# Patient Record
Sex: Female | Born: 1965 | Race: White | Hispanic: No | Marital: Married | Smoking: Current every day smoker
Health system: Southern US, Community
[De-identification: ages and names within clinical notes are randomized; demographics above are authoritative.]

## PROBLEM LIST (undated history)

## (undated) DIAGNOSIS — J45909 Unspecified asthma, uncomplicated: Secondary | ICD-10-CM

## (undated) DIAGNOSIS — F419 Anxiety disorder, unspecified: Secondary | ICD-10-CM

## (undated) DIAGNOSIS — G629 Polyneuropathy, unspecified: Secondary | ICD-10-CM

## (undated) DIAGNOSIS — J449 Chronic obstructive pulmonary disease, unspecified: Secondary | ICD-10-CM

## (undated) DIAGNOSIS — E11621 Type 2 diabetes mellitus with foot ulcer: Secondary | ICD-10-CM

## (undated) DIAGNOSIS — G43909 Migraine, unspecified, not intractable, without status migrainosus: Secondary | ICD-10-CM

## (undated) DIAGNOSIS — K759 Inflammatory liver disease, unspecified: Secondary | ICD-10-CM

## (undated) DIAGNOSIS — F32A Depression, unspecified: Secondary | ICD-10-CM

## (undated) DIAGNOSIS — M797 Fibromyalgia: Secondary | ICD-10-CM

## (undated) DIAGNOSIS — G6181 Chronic inflammatory demyelinating polyneuritis: Secondary | ICD-10-CM

## (undated) DIAGNOSIS — E785 Hyperlipidemia, unspecified: Secondary | ICD-10-CM

## (undated) DIAGNOSIS — I10 Essential (primary) hypertension: Secondary | ICD-10-CM

## (undated) DIAGNOSIS — D649 Anemia, unspecified: Secondary | ICD-10-CM

## (undated) DIAGNOSIS — K219 Gastro-esophageal reflux disease without esophagitis: Secondary | ICD-10-CM

## (undated) DIAGNOSIS — Z87442 Personal history of urinary calculi: Secondary | ICD-10-CM

## (undated) DIAGNOSIS — I509 Heart failure, unspecified: Secondary | ICD-10-CM

## (undated) DIAGNOSIS — N189 Chronic kidney disease, unspecified: Secondary | ICD-10-CM

## (undated) DIAGNOSIS — E119 Type 2 diabetes mellitus without complications: Secondary | ICD-10-CM

## (undated) DIAGNOSIS — I219 Acute myocardial infarction, unspecified: Secondary | ICD-10-CM

## (undated) HISTORY — PX: FOOT SURGERY: SHX648

## (undated) HISTORY — PX: CORONARY ANGIOPLASTY WITH STENT PLACEMENT: SHX49

## (undated) HISTORY — PX: ABDOMINAL HYSTERECTOMY: SHX81

## (undated) HISTORY — PX: COLONOSCOPY: SHX174

## (undated) HISTORY — PX: UPPER GI ENDOSCOPY: SHX6162

## (undated) HISTORY — PX: APPENDECTOMY: SHX54

## (undated) HISTORY — PX: SPINAL FUSION: SHX223

## (undated) HISTORY — PX: SHOULDER SURGERY: SHX246

## (undated) HISTORY — PX: CHOLECYSTECTOMY: SHX55

---

## 1993-04-15 HISTORY — PX: TOTAL ABDOMINAL HYSTERECTOMY: SHX209

## 2016-04-15 DIAGNOSIS — F319 Bipolar disorder, unspecified: Secondary | ICD-10-CM

## 2016-04-15 DIAGNOSIS — Z9889 Other specified postprocedural states: Secondary | ICD-10-CM

## 2016-04-15 HISTORY — DX: Other specified postprocedural states: Z98.890

## 2016-04-15 HISTORY — DX: Bipolar disorder, unspecified: F31.9

## 2020-01-04 DIAGNOSIS — E118 Type 2 diabetes mellitus with unspecified complications: Secondary | ICD-10-CM | POA: Insufficient documentation

## 2020-10-13 HISTORY — PX: OTHER SURGICAL HISTORY: SHX169

## 2020-10-25 LAB — HM COLONOSCOPY

## 2021-01-15 HISTORY — PX: CORONARY ANGIOPLASTY WITH STENT PLACEMENT: SHX49

## 2021-04-30 DIAGNOSIS — D649 Anemia, unspecified: Secondary | ICD-10-CM | POA: Diagnosis not present

## 2021-04-30 DIAGNOSIS — Z20822 Contact with and (suspected) exposure to covid-19: Secondary | ICD-10-CM | POA: Diagnosis not present

## 2021-04-30 DIAGNOSIS — I5023 Acute on chronic systolic (congestive) heart failure: Secondary | ICD-10-CM | POA: Diagnosis not present

## 2021-04-30 DIAGNOSIS — R9431 Abnormal electrocardiogram [ECG] [EKG]: Secondary | ICD-10-CM | POA: Diagnosis not present

## 2021-04-30 DIAGNOSIS — J441 Chronic obstructive pulmonary disease with (acute) exacerbation: Secondary | ICD-10-CM | POA: Diagnosis not present

## 2021-04-30 DIAGNOSIS — E1165 Type 2 diabetes mellitus with hyperglycemia: Secondary | ICD-10-CM | POA: Diagnosis not present

## 2021-04-30 DIAGNOSIS — Z79899 Other long term (current) drug therapy: Secondary | ICD-10-CM | POA: Diagnosis not present

## 2021-04-30 DIAGNOSIS — J90 Pleural effusion, not elsewhere classified: Secondary | ICD-10-CM | POA: Diagnosis not present

## 2021-04-30 DIAGNOSIS — Z7951 Long term (current) use of inhaled steroids: Secondary | ICD-10-CM | POA: Diagnosis not present

## 2021-05-12 LAB — HM DIABETES EYE EXAM

## 2021-06-22 ENCOUNTER — Other Ambulatory Visit (HOSPITAL_COMMUNITY): Payer: Self-pay | Admitting: Orthopedic Surgery

## 2021-06-22 ENCOUNTER — Other Ambulatory Visit (HOSPITAL_COMMUNITY): Payer: Self-pay | Admitting: Internal Medicine

## 2021-06-22 DIAGNOSIS — I5032 Chronic diastolic (congestive) heart failure: Secondary | ICD-10-CM

## 2021-07-03 ENCOUNTER — Ambulatory Visit (HOSPITAL_COMMUNITY): Payer: Self-pay

## 2021-07-13 ENCOUNTER — Ambulatory Visit (HOSPITAL_COMMUNITY)
Admission: RE | Admit: 2021-07-13 | Discharge: 2021-07-13 | Disposition: A | Discharge status: Home / Self Care | Payer: 59 | Source: Ambulatory Visit | Attending: Internal Medicine | Admitting: Internal Medicine

## 2021-07-13 DIAGNOSIS — I340 Nonrheumatic mitral (valve) insufficiency: Secondary | ICD-10-CM | POA: Diagnosis not present

## 2021-07-13 DIAGNOSIS — I2510 Atherosclerotic heart disease of native coronary artery without angina pectoris: Secondary | ICD-10-CM | POA: Diagnosis not present

## 2021-07-13 DIAGNOSIS — I5032 Chronic diastolic (congestive) heart failure: Secondary | ICD-10-CM

## 2021-07-13 LAB — ECHOCARDIOGRAM COMPLETE
Area-P 1/2: 3.85 cm2
MV M vel: 4.93 m/s
MV Peak grad: 97.2 mmHg
Radius: 0.5 cm
S' Lateral: 4.2 cm
Single Plane A4C EF: 38.7 %

## 2021-07-13 NOTE — Progress Notes (Signed)
?  Echocardiogram ?2D Echocardiogram has been performed. ? ?Tracy Walsh ?07/13/2021, 2:06 PM ?

## 2021-07-24 ENCOUNTER — Other Ambulatory Visit (HOSPITAL_COMMUNITY): Payer: Self-pay | Admitting: Internal Medicine

## 2021-08-13 DIAGNOSIS — Z9289 Personal history of other medical treatment: Secondary | ICD-10-CM

## 2021-08-13 HISTORY — DX: Personal history of other medical treatment: Z92.89

## 2021-08-27 ENCOUNTER — Other Ambulatory Visit (HOSPITAL_COMMUNITY): Payer: Self-pay | Admitting: Nephrology

## 2021-08-27 ENCOUNTER — Ambulatory Visit: Payer: 59 | Admitting: Orthopedic Surgery

## 2021-08-27 DIAGNOSIS — E1122 Type 2 diabetes mellitus with diabetic chronic kidney disease: Secondary | ICD-10-CM

## 2021-08-29 ENCOUNTER — Observation Stay (HOSPITAL_COMMUNITY): Payer: 59

## 2021-08-29 ENCOUNTER — Emergency Department (HOSPITAL_COMMUNITY): Payer: 59

## 2021-08-29 ENCOUNTER — Inpatient Hospital Stay (HOSPITAL_COMMUNITY)
Admission: EM | Admit: 2021-08-29 | Discharge: 2021-09-01 | DRG: 683 | Disposition: A | Discharge status: Home / Self Care | Payer: 59 | Attending: Internal Medicine | Admitting: Internal Medicine

## 2021-08-29 ENCOUNTER — Encounter (HOSPITAL_COMMUNITY): Payer: Self-pay

## 2021-08-29 DIAGNOSIS — E860 Dehydration: Secondary | ICD-10-CM | POA: Diagnosis present

## 2021-08-29 DIAGNOSIS — L97418 Non-pressure chronic ulcer of right heel and midfoot with other specified severity: Secondary | ICD-10-CM

## 2021-08-29 DIAGNOSIS — E11621 Type 2 diabetes mellitus with foot ulcer: Secondary | ICD-10-CM | POA: Diagnosis present

## 2021-08-29 DIAGNOSIS — L97419 Non-pressure chronic ulcer of right heel and midfoot with unspecified severity: Secondary | ICD-10-CM | POA: Diagnosis present

## 2021-08-29 DIAGNOSIS — I2510 Atherosclerotic heart disease of native coronary artery without angina pectoris: Secondary | ICD-10-CM | POA: Diagnosis present

## 2021-08-29 DIAGNOSIS — D649 Anemia, unspecified: Secondary | ICD-10-CM | POA: Diagnosis present

## 2021-08-29 DIAGNOSIS — E1140 Type 2 diabetes mellitus with diabetic neuropathy, unspecified: Secondary | ICD-10-CM | POA: Diagnosis present

## 2021-08-29 DIAGNOSIS — J9611 Chronic respiratory failure with hypoxia: Secondary | ICD-10-CM | POA: Diagnosis present

## 2021-08-29 DIAGNOSIS — E876 Hypokalemia: Secondary | ICD-10-CM | POA: Diagnosis present

## 2021-08-29 DIAGNOSIS — E1122 Type 2 diabetes mellitus with diabetic chronic kidney disease: Secondary | ICD-10-CM | POA: Diagnosis present

## 2021-08-29 DIAGNOSIS — K219 Gastro-esophageal reflux disease without esophagitis: Secondary | ICD-10-CM | POA: Diagnosis present

## 2021-08-29 DIAGNOSIS — K529 Noninfective gastroenteritis and colitis, unspecified: Secondary | ICD-10-CM | POA: Diagnosis not present

## 2021-08-29 DIAGNOSIS — N179 Acute kidney failure, unspecified: Secondary | ICD-10-CM | POA: Diagnosis present

## 2021-08-29 DIAGNOSIS — Z981 Arthrodesis status: Secondary | ICD-10-CM

## 2021-08-29 DIAGNOSIS — I25119 Atherosclerotic heart disease of native coronary artery with unspecified angina pectoris: Secondary | ICD-10-CM | POA: Diagnosis not present

## 2021-08-29 DIAGNOSIS — A0472 Enterocolitis due to Clostridium difficile, not specified as recurrent: Secondary | ICD-10-CM | POA: Diagnosis present

## 2021-08-29 DIAGNOSIS — I130 Hypertensive heart and chronic kidney disease with heart failure and stage 1 through stage 4 chronic kidney disease, or unspecified chronic kidney disease: Secondary | ICD-10-CM | POA: Diagnosis present

## 2021-08-29 DIAGNOSIS — J449 Chronic obstructive pulmonary disease, unspecified: Secondary | ICD-10-CM | POA: Diagnosis present

## 2021-08-29 DIAGNOSIS — N189 Chronic kidney disease, unspecified: Secondary | ICD-10-CM | POA: Diagnosis not present

## 2021-08-29 DIAGNOSIS — G6181 Chronic inflammatory demyelinating polyneuritis: Secondary | ICD-10-CM | POA: Diagnosis present

## 2021-08-29 DIAGNOSIS — E869 Volume depletion, unspecified: Secondary | ICD-10-CM | POA: Diagnosis present

## 2021-08-29 DIAGNOSIS — L97409 Non-pressure chronic ulcer of unspecified heel and midfoot with unspecified severity: Secondary | ICD-10-CM | POA: Diagnosis not present

## 2021-08-29 DIAGNOSIS — Z955 Presence of coronary angioplasty implant and graft: Secondary | ICD-10-CM

## 2021-08-29 DIAGNOSIS — E1165 Type 2 diabetes mellitus with hyperglycemia: Secondary | ICD-10-CM | POA: Diagnosis present

## 2021-08-29 DIAGNOSIS — I252 Old myocardial infarction: Secondary | ICD-10-CM

## 2021-08-29 DIAGNOSIS — I739 Peripheral vascular disease, unspecified: Secondary | ICD-10-CM

## 2021-08-29 DIAGNOSIS — F17210 Nicotine dependence, cigarettes, uncomplicated: Secondary | ICD-10-CM | POA: Diagnosis present

## 2021-08-29 DIAGNOSIS — L97429 Non-pressure chronic ulcer of left heel and midfoot with unspecified severity: Secondary | ICD-10-CM | POA: Diagnosis present

## 2021-08-29 DIAGNOSIS — Z79899 Other long term (current) drug therapy: Secondary | ICD-10-CM

## 2021-08-29 DIAGNOSIS — E08621 Diabetes mellitus due to underlying condition with foot ulcer: Secondary | ICD-10-CM | POA: Diagnosis not present

## 2021-08-29 DIAGNOSIS — E1142 Type 2 diabetes mellitus with diabetic polyneuropathy: Secondary | ICD-10-CM | POA: Diagnosis present

## 2021-08-29 DIAGNOSIS — Z7982 Long term (current) use of aspirin: Secondary | ICD-10-CM

## 2021-08-29 DIAGNOSIS — E11622 Type 2 diabetes mellitus with other skin ulcer: Secondary | ICD-10-CM | POA: Diagnosis present

## 2021-08-29 DIAGNOSIS — I509 Heart failure, unspecified: Secondary | ICD-10-CM

## 2021-08-29 DIAGNOSIS — R112 Nausea with vomiting, unspecified: Secondary | ICD-10-CM

## 2021-08-29 DIAGNOSIS — R197 Diarrhea, unspecified: Secondary | ICD-10-CM

## 2021-08-29 DIAGNOSIS — Z7902 Long term (current) use of antithrombotics/antiplatelets: Secondary | ICD-10-CM

## 2021-08-29 DIAGNOSIS — Z880 Allergy status to penicillin: Secondary | ICD-10-CM | POA: Diagnosis not present

## 2021-08-29 DIAGNOSIS — Z8249 Family history of ischemic heart disease and other diseases of the circulatory system: Secondary | ICD-10-CM

## 2021-08-29 DIAGNOSIS — I5042 Chronic combined systolic (congestive) and diastolic (congestive) heart failure: Secondary | ICD-10-CM | POA: Diagnosis present

## 2021-08-29 DIAGNOSIS — Z882 Allergy status to sulfonamides status: Secondary | ICD-10-CM

## 2021-08-29 DIAGNOSIS — N184 Chronic kidney disease, stage 4 (severe): Secondary | ICD-10-CM | POA: Diagnosis present

## 2021-08-29 DIAGNOSIS — R079 Chest pain, unspecified: Secondary | ICD-10-CM | POA: Diagnosis present

## 2021-08-29 DIAGNOSIS — I10 Essential (primary) hypertension: Secondary | ICD-10-CM | POA: Diagnosis present

## 2021-08-29 HISTORY — DX: Chronic obstructive pulmonary disease, unspecified: J44.9

## 2021-08-29 HISTORY — DX: Type 2 diabetes mellitus with foot ulcer: E11.621

## 2021-08-29 HISTORY — DX: Chronic inflammatory demyelinating polyneuritis: G61.81

## 2021-08-29 HISTORY — DX: Migraine, unspecified, not intractable, without status migrainosus: G43.909

## 2021-08-29 HISTORY — DX: Chronic kidney disease, unspecified: N18.9

## 2021-08-29 HISTORY — DX: Heart failure, unspecified: I50.9

## 2021-08-29 HISTORY — DX: Type 2 diabetes mellitus without complications: E11.9

## 2021-08-29 HISTORY — DX: Polyneuropathy, unspecified: G62.9

## 2021-08-29 HISTORY — DX: Gastro-esophageal reflux disease without esophagitis: K21.9

## 2021-08-29 LAB — TROPONIN I (HIGH SENSITIVITY)
Troponin I (High Sensitivity): 23 ng/L — ABNORMAL HIGH (ref <18)
Troponin I (High Sensitivity): 22 ng/L — ABNORMAL HIGH (ref <18)

## 2021-08-29 LAB — CBC
HCT: 28.1 % — ABNORMAL LOW (ref 36.0–46.0)
Hemoglobin: 8.8 g/dL — ABNORMAL LOW (ref 12.0–15.0)
MCH: 25.8 pg — ABNORMAL LOW (ref 26.0–34.0)
MCHC: 31.3 g/dL (ref 30.0–36.0)
MCV: 82.4 fL (ref 80.0–100.0)
Platelets: 347 10*3/uL (ref 150–400)
RBC: 3.41 MIL/uL — ABNORMAL LOW (ref 3.87–5.11)
RDW: 17.2 % — ABNORMAL HIGH (ref 11.5–15.5)
WBC: 23.6 10*3/uL — ABNORMAL HIGH (ref 4.0–10.5)
nRBC: 0 % (ref 0.0–0.2)

## 2021-08-29 LAB — COMPREHENSIVE METABOLIC PANEL
ALT: 16 U/L (ref 0–44)
AST: 16 U/L (ref 15–41)
Albumin: 3.1 g/dL — ABNORMAL LOW (ref 3.5–5.0)
Alkaline Phosphatase: 107 U/L (ref 38–126)
Anion gap: 16 — ABNORMAL HIGH (ref 5–15)
BUN: 45 mg/dL — ABNORMAL HIGH (ref 6–20)
CO2: 44 mmol/L — ABNORMAL HIGH (ref 22–32)
Calcium: 8.6 mg/dL — ABNORMAL LOW (ref 8.9–10.3)
Chloride: 83 mmol/L — ABNORMAL LOW (ref 98–111)
Creatinine, Ser: 3.47 mg/dL — ABNORMAL HIGH (ref 0.44–1.00)
GFR, Estimated: 15 mL/min — ABNORMAL LOW (ref >60)
Glucose, Bld: 134 mg/dL — ABNORMAL HIGH (ref 70–99)
Potassium: 3.1 mmol/L — ABNORMAL LOW (ref 3.5–5.1)
Sodium: 143 mmol/L (ref 135–145)
Total Bilirubin: 0.5 mg/dL (ref 0.3–1.2)
Total Protein: 8 g/dL (ref 6.5–8.1)

## 2021-08-29 LAB — MAGNESIUM: Magnesium: 1.8 mg/dL (ref 1.7–2.4)

## 2021-08-29 LAB — CBG MONITORING, ED: Glucose-Capillary: 74 mg/dL (ref 70–99)

## 2021-08-29 LAB — LIPASE, BLOOD: Lipase: 27 U/L (ref 11–51)

## 2021-08-29 MED ORDER — ISOSORBIDE MONONITRATE ER 60 MG PO TB24
30.0000 mg | ORAL_TABLET | Freq: Every day | ORAL | Status: DC
  Administered 2021-08-30 – 2021-09-01 (×3): 30 mg via ORAL
  Filled 2021-08-29 (×3): qty 1

## 2021-08-29 MED ORDER — LORAZEPAM 2 MG/ML IJ SOLN
0.5000 mg | Freq: Once | INTRAMUSCULAR | Status: AC
  Administered 2021-08-30: 0.5 mg via INTRAVENOUS
  Filled 2021-08-29: qty 1

## 2021-08-29 MED ORDER — HEPARIN SODIUM (PORCINE) 5000 UNIT/ML IJ SOLN: 5000.0000 [IU] | Freq: Three times a day (TID) | INTRAMUSCULAR | Status: DC

## 2021-08-29 MED ORDER — SODIUM CHLORIDE 0.9 % IV BOLUS
500.0000 mL | Freq: Once | INTRAVENOUS | Status: AC
  Administered 2021-08-29: 500 mL via INTRAVENOUS

## 2021-08-29 MED ORDER — ATORVASTATIN CALCIUM 40 MG PO TABS
40.0000 mg | ORAL_TABLET | Freq: Every day | ORAL | Status: DC
  Administered 2021-08-30 – 2021-09-01 (×3): 40 mg via ORAL
  Filled 2021-08-29 (×3): qty 1

## 2021-08-29 MED ORDER — ONDANSETRON 4 MG PO TBDP
4.0000 mg | ORAL_TABLET | Freq: Once | ORAL | Status: AC
  Administered 2021-08-29: 4 mg via ORAL
  Filled 2021-08-29: qty 1

## 2021-08-29 MED ORDER — ACETAMINOPHEN 325 MG PO TABS
650.0000 mg | ORAL_TABLET | Freq: Four times a day (QID) | ORAL | Status: DC | PRN
  Administered 2021-08-30 – 2021-09-01 (×2): 650 mg via ORAL
  Filled 2021-08-29 (×2): qty 2

## 2021-08-29 MED ORDER — SODIUM CHLORIDE 0.9 % IV SOLN: INTRAVENOUS | Status: DC

## 2021-08-29 MED ORDER — ACETAMINOPHEN 325 MG PO TABS
650.0000 mg | ORAL_TABLET | Freq: Once | ORAL | Status: AC
  Administered 2021-08-29: 650 mg via ORAL
  Filled 2021-08-29: qty 2

## 2021-08-29 MED ORDER — MORPHINE SULFATE (PF) 2 MG/ML IV SOLN
2.0000 mg | Freq: Once | INTRAVENOUS | Status: AC
  Administered 2021-08-29: 2 mg via INTRAVENOUS
  Filled 2021-08-29: qty 1

## 2021-08-29 MED ORDER — ASPIRIN 81 MG PO TBEC
81.0000 mg | DELAYED_RELEASE_TABLET | Freq: Every day | ORAL | Status: DC
  Administered 2021-08-30 – 2021-09-01 (×3): 81 mg via ORAL
  Filled 2021-08-29 (×3): qty 1

## 2021-08-29 MED ORDER — POTASSIUM CHLORIDE CRYS ER 20 MEQ PO TBCR
40.0000 meq | EXTENDED_RELEASE_TABLET | Freq: Once | ORAL | Status: AC
  Administered 2021-08-29: 40 meq via ORAL
  Filled 2021-08-29: qty 2

## 2021-08-29 MED ORDER — PROMETHAZINE HCL 12.5 MG PO TABS
12.5000 mg | ORAL_TABLET | Freq: Four times a day (QID) | ORAL | Status: DC | PRN
  Administered 2021-08-30 – 2021-09-01 (×5): 12.5 mg via ORAL
  Filled 2021-08-29 (×5): qty 1

## 2021-08-29 MED ORDER — INSULIN ASPART 100 UNIT/ML IJ SOLN
0.0000 [IU] | INTRAMUSCULAR | Status: DC
  Administered 2021-08-30 (×2): 2 [IU] via SUBCUTANEOUS
  Administered 2021-08-31 – 2021-09-01 (×4): 1 [IU] via SUBCUTANEOUS

## 2021-08-29 MED ORDER — LOPERAMIDE HCL 2 MG PO CAPS
2.0000 mg | ORAL_CAPSULE | ORAL | Status: AC | PRN
  Administered 2021-08-29 – 2021-08-31 (×2): 2 mg via ORAL
  Filled 2021-08-29 (×2): qty 1

## 2021-08-29 MED ORDER — HEPARIN SODIUM (PORCINE) 5000 UNIT/ML IJ SOLN
5000.0000 [IU] | Freq: Three times a day (TID) | INTRAMUSCULAR | Status: DC
  Administered 2021-08-30 – 2021-09-01 (×7): 5000 [IU] via SUBCUTANEOUS
  Filled 2021-08-29 (×7): qty 1

## 2021-08-29 MED ORDER — METOCLOPRAMIDE HCL 5 MG/ML IJ SOLN
10.0000 mg | Freq: Once | INTRAMUSCULAR | Status: AC
  Administered 2021-08-29: 10 mg via INTRAVENOUS
  Filled 2021-08-29: qty 2

## 2021-08-29 MED ORDER — POTASSIUM CHLORIDE 10 MEQ/100ML IV SOLN
10.0000 meq | INTRAVENOUS | Status: AC
  Administered 2021-08-29 (×2): 10 meq via INTRAVENOUS
  Filled 2021-08-29 (×2): qty 100

## 2021-08-29 MED ORDER — ACETAMINOPHEN 650 MG RE SUPP: 650.0000 mg | Freq: Four times a day (QID) | RECTAL | Status: DC | PRN

## 2021-08-29 MED ORDER — CLOPIDOGREL BISULFATE 75 MG PO TABS
75.0000 mg | ORAL_TABLET | Freq: Every day | ORAL | Status: DC
  Administered 2021-08-30 – 2021-09-01 (×3): 75 mg via ORAL
  Filled 2021-08-29 (×3): qty 1

## 2021-08-29 MED ORDER — METOPROLOL SUCCINATE ER 50 MG PO TB24
50.0000 mg | ORAL_TABLET | Freq: Every day | ORAL | Status: DC
  Administered 2021-08-30 – 2021-09-01 (×3): 50 mg via ORAL
  Filled 2021-08-29 (×3): qty 1

## 2021-08-29 NOTE — Assessment & Plan Note (Addendum)
Reports chest pain initially, appears to be GI related.   Troponins, EKG unremarkable.  No prior EKG to compare.   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  She is on Plavix. -Resume ASA Plavix, Imdur, metoprolol, atorvastatin -no CP since admission

## 2021-08-29 NOTE — ED Notes (Signed)
Patient transported to CT 

## 2021-08-29 NOTE — Assessment & Plan Note (Addendum)
Stable. ?-Continue med reconciliation resume home medications, Imdur, metoprolol, hydralazine ?

## 2021-08-29 NOTE — Assessment & Plan Note (Addendum)
-  Baseline creatinine 2.1-2.4 -Presented with serum creatinine 3.47 -Secondary to volume depletion -Hold home Lasix 80 mg daily>>resume after dc -serum creatinine 2.40 at time of d/c Repeat BMP one week after d/c

## 2021-08-29 NOTE — ED Triage Notes (Signed)
Pt c/o constant central chest pain, headache, and n/v/d x2 days.  Pain score 8/10.  Hx of CHF, CKD, and anemia. ? ?Pt reports taking phenergan w/o relief.   ?

## 2021-08-29 NOTE — H&P (Signed)
?History and Physical  ? ? ?Tracy Walsh ACZ:[id number] DOB: Jun 07, 1965 DOA: 08/29/2021 ? ?PCP: Neale Burly, MD  ? ?Patient coming from: Home ? ?I have personally briefly reviewed patient's old medical records in [place] [facility] ? ?Chief Complaint: Vomiting, chest pain ? ?HPI: Tracy Walsh is a 56 y.o. female with medical history significant for congestive heart failure, CIDP, CKD, COPD, diabetes mellitus. ?Patient presented to the ED with complaints of vomiting chest pain and chronic diarrhea.  Patient reports 2 days ago- 5/15, she went to [facility] and subsequently started vomiting.  Reports about 22 episodes of vomiting since onset.  Vomitus is not black and without blood.  No abdominal pain no fevers no chills.  She has chronic diarrhea that is unchanged.  She reports burning chest pain mid lower chest, non-radiating, without associated difficulty breathing.  ?Chronic diarrhea for a year now, at baseline she has 4-6 bowel movements daily.  Had 4 bowel movements today.  She reports colonoscopies, negative stool C. difficile and other work-up that has been completed in the past year. ?Patient was recently residing in [place], up until November 2022 she now lives in [place]. ? ?Patient had blood work checked, her creatinine was 2.9, she had a leukocytosis of 22. ? ?Patient has a wound to her bilateral heels present over the past months.  No prior trauma.  She has reduced sensation to both feet.  Spouse reports drainage from left heel.  ? ?ED Course: Blood pressure 130s to 170s.  Temperature 98.7.  Heart rate 80s to 91.  Respirate rate 11-20. ?Potassium 3.1.  Mag 1.8.  Troponin 23 > 22.  Creatinine elevated 3.47. ?Chest x-ray without airspace consolidation, shows bronchial wall thickening consistent with patient's COPD.  Also bronchiectasis may be present. ?CT abdomen and pelvis without contrast-without acute abnormality, shows trace bilateral pleural effusions and gallbladder pneumatosis of the spleen. ?100  mill bolus given.  Hospitalist to admit for acute kidney injury. ? ?Review of Systems: As per HPI all other systems reviewed and negative. ? ?Past Medical History:  ?Diagnosis Date  ? CHF (congestive heart failure) ([place])   ? CIDP (chronic inflammatory demyelinating polyneuropathy) ([facility])   ? CKD (chronic kidney disease)   ? COPD (chronic obstructive pulmonary disease) ([place])   ? Diabetes mellitus without complication ([place])   ? Diabetic foot ulcers ([place])   ? GERD (gastroesophageal reflux disease)   ? Migraines   ? Neuropathy   ? ? ?Past Surgical History:  ?Procedure Laterality Date  ? ABDOMINAL HYSTERECTOMY    ? CHOLECYSTECTOMY    ? CORONARY ANGIOPLASTY WITH STENT PLACEMENT    ? SHOULDER SURGERY Bilateral   ? SPINAL FUSION    ? ? ? reports that she has been smoking cigarettes. She has been smoking an average of .5 packs per day. She has never used smokeless tobacco. She reports that she does not drink alcohol and does not use drugs. ? ?Allergies  ?Allergen Reactions  ? Amoxicillin Anaphylaxis  ? Penicillins Anaphylaxis  ? Sulfa Antibiotics Anaphylaxis  ? ?Family history of hypertension. ? ?Prior to Admission medications   ?Medication Sig Start Date End Date Taking? Authorizing Provider  ?albuterol (PROVENTIL) (2.5 MG/3ML) 0.083% nebulizer solution Take 2.5 mg by nebulization every 6 (six) hours as needed. 05/08/21  Yes [provider]  ?albuterol (VENTOLIN HFA) 108 (90 Base) MCG/ACT inhaler Inhale into the lungs. 02/17/20  Yes [provider]  ?amitriptyline (ELAVIL) 10 MG tablet  08/03/21  Yes [provider]  ?ARIPiprazole (ABILIFY)  5 MG tablet Take by mouth. 12/14/19  Yes [provider]  ?aspirin EC 81 MG EC tablet Take 1 tablet by mouth daily. 09/20/20 09/08/21 Yes [provider]  ?atorvastatin (LIPITOR) 40 MG tablet Take by mouth. 08/03/21 09/07/21 Yes [provider]  ?busPIRone (BUSPAR) 15 MG tablet Take 15 mg by mouth 2 (two) times daily. 09/18/20  Yes [provider]  ?Cholecalciferol 1.25 MG (50000 UT) capsule Take 50,000 Units by mouth once a week. 05/01/21  Yes [provider]  ?clopidogrel (PLAVIX) 75 MG tablet Take 75 mg by mouth daily. 07/01/18 09/07/21 Yes [provider]  ?colestipol (COLESTID) 1 g tablet Take 1 g by mouth 3 (three) times daily. 06/12/21  Yes [provider]  ?doxazosin (CARDURA) 4 MG tablet Take 4 mg by mouth daily. 08/20/21  Yes [provider]  ?DULoxetine (CYMBALTA) 60 MG capsule Take by mouth. 09/18/20  Yes [provider]  ?escitalopram (LEXAPRO) 20 MG tablet Take by mouth.   Yes [provider]  ?ferrous sulfate 325 (65 FE) MG EC tablet Take by mouth. 02/19/21  Yes [provider]  ?furosemide (LASIX) 80 MG tablet Take 80 mg by mouth daily. 08/09/21 08/22/22 Yes [provider]  ?gabapentin (NEURONTIN) 100 MG capsule Take 200 mg by mouth 2 (two) times daily. 08/21/21  Yes [provider]  ?glimepiride (AMARYL) 2 MG tablet Take 2 mg by mouth daily with breakfast. 08/03/21 09/07/21 Yes [provider]  ?hydrALAZINE (APRESOLINE) 50 MG tablet Take 50 mg by mouth daily. 08/08/21 09/07/21 Yes [provider]  ?injection device for insulin DEVI by Does not apply route. 10/27/20  Yes [provider]  ?isosorbide mononitrate (IMDUR) 30 MG 24 hr tablet Take 30 mg by mouth daily. 05/04/21  Yes [provider]  ?metoprolol succinate (TOPROL-XL) 50 MG 24 hr tablet Take 50 mg by mouth daily. 08/09/21 09/08/21 Yes [provider]  ?sodium bicarbonate 325 MG tablet Take by mouth.   Yes [provider]  ?vitamin B-12 (CYANOCOBALAMIN) 1000 MCG tablet Take 1,000 mcg by mouth daily. 08/03/21  Yes [provider]  ?zolpidem (AMBIEN) 5 MG tablet Take 5 mg by mouth at bedtime. 08/20/21  Yes [provider]  ? ? ?Physical Exam: ?Vitals:  ? 08/29/21 1715 08/29/21 1730 08/29/21 1800 08/29/21 1815  ?BP:  (!) 162/81 (!) 158/81    ?Pulse: 91 89 88 88  ?Resp: 14 12 11 16   ?Temp:      ?TempSrc:      ?SpO2: 99% 100% 100% 97%  ?Weight:      ?Height:      ? ? ?Constitutional: NAD, calm, comfortable ?Vitals:  ? 08/29/21 1715 08/29/21 1730 08/29/21 1800 08/29/21 1815  ?BP:  (!) 162/81 (!) 158/81   ?Pulse: 91 89 88 88  ?Resp: 14 12 11 16   ?Temp:      ?TempSrc:      ?SpO2: 99% 100% 100% 97%  ?Weight:      ?Height:      ? ?Eyes: PERRL, lids and conjunctivae normal ?ENMT: Mucous membranes are dry  ?Neck: normal, supple, no masses, no thyromegaly ?Respiratory: clear to auscultation bilaterally, no wheezing, no crackles. Normal respiratory effort. No accessory muscle use.  ?Cardiovascular: Regular rate and rhythm, no murmurs / rubs / gallops.  Trace to 1+ pitting bilateral lower extremity swelling patient reports this is new over the past month.  Lower extremities warm ?Abdomen: no tenderness, no masses palpated. No hepatosplenomegaly. Bowel sounds positive.  ?  Musculoskeletal: no clubbing / cyanosis. No joint deformity upper and lower extremities. Good ROM, no contractures. Normal muscle tone.  ?Skin: Chronic ulcers to bilateral heels left and right heel.  Foul smell from left heel, no surrounding erythema  cellulitis.  Black discoloration to left heel.  Left heel ulcer about 5 cm across. ?Neurologic: No apparent cranial nerve abnormality, moving EXTR spontaneously ?Psychiatric: Normal judgment and insight. Alert and oriented x 3. Normal mood.  ? ? ? ? ? ? ?Labs on Admission: I have personally reviewed following labs and imaging studies ? ?CBC: ?Recent Labs  ?Lab 08/29/21 ?1226  ?WBC 23.6*  ?HGB 8.8*  ?HCT 28.1*  ?MCV 82.4  ?PLT 347  ? ?Basic Metabolic Panel: ?Recent Labs  ?Lab 08/29/21 ?1226 08/29/21 ?1412  ?NA 143  --   ?K 3.1*  --   ?CL 83*  --   ?CO2 44*  --   ?GLUCOSE 134*  --   ?BUN 45*  --   ?CREATININE 3.47*  --   ?CALCIUM 8.6*  --   ?MG  --  1.8  ? ?Liver Function Tests: ?Recent Labs  ?Lab 08/29/21 ?1226  ?AST 16  ?ALT 16  ?ALKPHOS 107  ?BILITOT  0.5  ?PROT 8.0  ?ALBUMIN 3.1*  ? ?Recent Labs  ?Lab 08/29/21 ?1226  ?LIPASE 27  ? ? ?Radiological Exams on Admission: ?CT ABDOMEN PELVIS WO CONTRAST ? ?Result Date: 08/29/2021 ?CLINICAL DATA:  Abdominal pain, acu

## 2021-08-29 NOTE — Assessment & Plan Note (Addendum)
-  Likely from GI losses.  Replete.

## 2021-08-29 NOTE — ED Notes (Signed)
Pt incont of stool, pt states hx of IBS and states incont is new. Pt cleaned and bottom chuck replaced and depends.   ?

## 2021-08-29 NOTE — Assessment & Plan Note (Signed)
Unspecified type.  On Lasix 80 mg daily.  Has bilateral lower extremity pitting edema with patient new over the past month.  Chest x-ray not suggestive of decompensation. ?-Gentle hydration for now ?-Hold Lasix with AKI on CKD ?

## 2021-08-29 NOTE — ED Provider Triage Note (Signed)
Emergency Medicine Provider Triage Evaluation Note ? ?Tracy Walsh , a 56 y.o. female  was evaluated in triage.  Pt complains of chest pain, nausea, and vomiting. States that same has been ongoing for the past 2 days. Her chest pain is located in the center of her chest and is burning in nature and does not radiate. States she has had 2 Mis in the past and this pain is different. Also states she has been having diarrhea as well, however she states that she has diarrhea at baseline.  She denies any shortness of breath, fevers, or chills.  Denies any cough or congestion.  Denies any abdominal pain. ? ?Review of Systems  ?Positive: Chest pain, nausea, vomiting ?Negative: Fevers, chills, cough, shortness of breath, abdominal pain ? ?Physical Exam  ?BP 133/65 (BP Location: Right Arm)   Pulse 85   Temp 98.7 ?F (37.1 ?C) (Oral)   Resp 20   Ht 5\' 8"  (1.727 m)   Wt 80.7 kg   SpO2 93%   BMI 27.06 kg/m?  ?Gen:   Awake, no distress   ?Resp:  Normal effort, mild wheezing throughout ?MSK:   Moves extremities without difficulty  ?Other:  Abdomen soft nontender ? ?Medical Decision Making  ?Medically screening exam initiated at 1:42 PM.  Appropriate orders placed.  Tracy Walsh was informed that the remainder of the evaluation will be completed by another provider, this initial triage assessment does not replace that evaluation, and the importance of remaining in the ED until their evaluation is complete. ? ? ?  ?Bud Face, PA-C ?08/29/21 1344 ? ?

## 2021-08-29 NOTE — Assessment & Plan Note (Addendum)
At baseline has 4-6 bowel movements daily.   -Patient requesting GI evaluation>> Consult appreciated -continue loperamide per GI -Cdiff positive antigen and PCR -follow up stool pathogen panel

## 2021-08-29 NOTE — ED Provider Notes (Signed)
[facility] Provider Note   CSN: [id number] Arrival date & time: 08/29/21  1039     History  Chief Complaint  Patient presents with   Chest Pain   Emesis   Diarrhea    Tracy Walsh is a 56 y.o. female.   Chest Pain Associated symptoms: abdominal pain, headache, nausea, vomiting and weakness (genralized weakness)   Associated symptoms: no cough, no diaphoresis, no dizziness, no fever and no shortness of breath   Emesis Associated symptoms: abdominal pain, diarrhea and headaches   Associated symptoms: no chills, no cough and no fever   Diarrhea Associated symptoms: abdominal pain, headaches and vomiting   Associated symptoms: no chills, no diaphoresis and no fever        Tracy Walsh is a 56 y.o. female with past medical history of CHF, GERD, type 2 diabetes, CIDP, CKD and COPD who presents to the Emergency Department complaining of chest pain, headache, nausea, vomiting and diarrhea.  Symptoms have been present for 2 days.  Notes vomiting began after eating [employer].  States that anytime she eats solid food or sips of liquid she vomits.  She has some pain of her upper abdomen that she describes as soreness, abdominal pain began after repeated vomiting.  She also describes having burning pain of her mid chest.  She denies any arm neck or jaw pain.  No shortness of breath.  Her diarrhea is described as brown and watery.  Headache described as throbbing in quality and radiating from back of her head to her forehead.  She reports chronic diarrhea for some time and attributes this to her IBS.  She denies any fever, chills, dysuria symptoms, or flank pain.  No recent sick contacts.  She was seen earlier this month by PCP and recently evaluated by nephrology and had blood work yesterday.  PCP Dr. Jerilee Field Nephrology Dr. Theador Hawthorne   Home Medications Prior to Admission medications   Medication Sig Start Date End Date Taking? Authorizing Provider  albuterol  (PROVENTIL) (2.5 MG/3ML) 0.083% nebulizer solution Take 2.5 mg by nebulization every 6 (six) hours as needed. 05/08/21  Yes [provider]  albuterol (VENTOLIN HFA) 108 (90 Base) MCG/ACT inhaler Inhale into the lungs. 02/17/20  Yes [provider]  amitriptyline (ELAVIL) 10 MG tablet  08/03/21  Yes [provider]  ARIPiprazole (ABILIFY) 5 MG tablet Take by mouth. 12/14/19  Yes [provider]  aspirin EC 81 MG EC tablet Take 1 tablet by mouth daily. 09/20/20 09/08/21 Yes [provider]  atorvastatin (LIPITOR) 40 MG tablet Take by mouth. 08/03/21 09/07/21 Yes [provider]  busPIRone (BUSPAR) 15 MG tablet Take 15 mg by mouth 2 (two) times daily. 09/18/20  Yes [provider]  Cholecalciferol 1.25 MG (50000 UT) capsule Take 50,000 Units by mouth once a week. 05/01/21  Yes [provider]  clopidogrel (PLAVIX) 75 MG tablet Take 75 mg by mouth daily. 07/01/18 09/07/21 Yes [provider]  colestipol (COLESTID) 1 g tablet Take 1 g by mouth 3 (three) times daily. 06/12/21  Yes [provider]  doxazosin (CARDURA) 4 MG tablet Take 4 mg by mouth daily. 08/20/21  Yes [provider]  DULoxetine (CYMBALTA) 60 MG capsule Take by mouth. 09/18/20  Yes [provider]  escitalopram (LEXAPRO) 20 MG tablet Take by mouth.   Yes [provider]  ferrous sulfate 325 (65 FE) MG EC tablet Take by mouth. 02/19/21  Yes [provider]  furosemide (LASIX)  80 MG tablet Take 80 mg by mouth daily. 08/09/21 08/22/22 Yes [provider]  gabapentin (NEURONTIN) 100 MG capsule Take 200 mg by mouth 2 (two) times daily. 08/21/21  Yes [provider]  glimepiride (AMARYL) 2 MG tablet Take 2 mg by mouth daily with breakfast. 08/03/21 09/07/21 Yes [provider]  hydrALAZINE (APRESOLINE) 50 MG tablet Take 50 mg by mouth daily. 08/08/21 09/07/21 Yes [provider]  injection device for insulin  DEVI by Does not apply route. 10/27/20  Yes [provider]  isosorbide mononitrate (IMDUR) 30 MG 24 hr tablet Take 30 mg by mouth daily. 05/04/21  Yes [provider]  metoprolol succinate (TOPROL-XL) 50 MG 24 hr tablet Take 50 mg by mouth daily. 08/09/21 09/08/21 Yes [provider]  sodium bicarbonate 325 MG tablet Take by mouth.   Yes [provider]  vitamin B-12 (CYANOCOBALAMIN) 1000 MCG tablet Take 1,000 mcg by mouth daily. 08/03/21  Yes [provider]  zolpidem (AMBIEN) 5 MG tablet Take 5 mg by mouth at bedtime. 08/20/21  Yes [provider]      Allergies    Amoxicillin, Penicillins, and Sulfa antibiotics    Review of Systems   Review of Systems  Constitutional:  Positive for appetite change. Negative for chills, diaphoresis and fever.  Respiratory:  Negative for cough, chest tightness and shortness of breath.   Cardiovascular:  Positive for chest pain.  Gastrointestinal:  Positive for abdominal pain, diarrhea, nausea and vomiting.  Genitourinary:  Positive for decreased urine volume. Negative for dysuria and flank pain.  Musculoskeletal:  Negative for neck pain and neck stiffness.  Neurological:  Positive for weakness (genralized weakness) and headaches. Negative for dizziness and syncope.   Physical Exam Updated Vital Signs BP (!) 170/89   Pulse 92   Temp 98 F (36.7 C)   Resp 13   Ht 5\' 8"  (1.727 m)   Wt 80.7 kg   SpO2 100%   BMI 27.06 kg/m  Physical Exam Vitals and nursing note reviewed.  Constitutional:      Appearance: She is not ill-appearing or toxic-appearing.  HENT:     Mouth/Throat:     Mouth: Mucous membranes are moist.     Pharynx: Oropharynx is clear.  Eyes:     Conjunctiva/sclera: Conjunctivae normal.     Pupils: Pupils are equal, round, and reactive to light.  Cardiovascular:     Rate and Rhythm: Normal rate and regular rhythm.     Pulses: Normal pulses.  Pulmonary:     Effort: Pulmonary effort is  normal. No respiratory distress.     Breath sounds: No wheezing.  Abdominal:     Palpations: Abdomen is soft.     Tenderness: There is abdominal tenderness.     Comments: Mild epigastric tenderness without guarding or rebound.  No CVA tenderness  Musculoskeletal:     Cervical back: Normal range of motion.     Right lower leg: Edema present.     Left lower leg: Edema present.  Skin:    General: Skin is warm.     Capillary Refill: Capillary refill takes less than 2 seconds.  Neurological:     General: No focal deficit present.     Mental Status: She is alert.     Sensory: No sensory deficit.     Motor: No weakness.    ED Results / Procedures / Treatments   Labs (all labs ordered are listed, but only abnormal results are displayed) Labs Reviewed  CBC - Abnormal; Notable for the following components:      Result Value   WBC 23.6 (*)    RBC 3.41 (*)    Hemoglobin 8.8 (*)    HCT 28.1 (*)    MCH 25.8 (*)    RDW 17.2 (*)    All other components within normal limits  COMPREHENSIVE METABOLIC PANEL - Abnormal; Notable for the following components:   Potassium 3.1 (*)    Chloride 83 (*)    CO2 44 (*)    Glucose, Bld 134 (*)    BUN 45 (*)    Creatinine, Ser 3.47 (*)    Calcium 8.6 (*)    Albumin 3.1 (*)    GFR, Estimated 15 (*)    Anion gap 16 (*)    All other components within normal limits  TROPONIN I (HIGH SENSITIVITY) - Abnormal; Notable for the following components:   Troponin I (High Sensitivity) 23 (*)    All other components within normal limits  TROPONIN I (HIGH SENSITIVITY) - Abnormal; Notable for the following components:   Troponin I (High Sensitivity) 22 (*)    All other components within normal limits  LIPASE, BLOOD  MAGNESIUM  URINALYSIS, ROUTINE W REFLEX MICROSCOPIC    EKG EKG Interpretation  Date/Time:  Wednesday Aug 29 2021 11:43:14 EDT Ventricular Rate:  84 PR Interval:  160 QRS Duration: 124 QT Interval:  418 QTC Calculation: 493 R  Axis:   -47 Text Interpretation: Normal sinus rhythm Left anterior fascicular block Left ventricular hypertrophy with QRS widening ( R in aVL , Cornell product , Romhilt-Estes ) Cannot rule out Septal infarct , age undetermined Abnormal ECG No previous ECGs available Confirmed by Octaviano Glow [phone]) on 08/29/2021 3:18:22 PM  Radiology CT ABDOMEN PELVIS WO CONTRAST  Result Date: 08/29/2021 CLINICAL DATA:  Abdominal pain, acute, nonlocalized EXAM: CT ABDOMEN AND PELVIS WITHOUT CONTRAST TECHNIQUE: Multidetector CT imaging of the abdomen and pelvis was performed following the standard protocol without IV contrast. RADIATION DOSE REDUCTION: This exam was performed according to the departmental dose-optimization program which includes automated exposure control, adjustment of the mA and/or kV according to patient size and/or use of iterative reconstruction technique. COMPARISON:  None Available. FINDINGS: Lower chest: Trace bilateral pleural effusions, right greater than left. Hepatobiliary: No focal liver abnormality is seen. Status post cholecystectomy. No biliary dilatation. Pancreas: No focal abnormality or ductal dilatation. Spleen: Calcifications throughout the spleen compatible with old granulomatous disease. Normal size. Adrenals/Urinary Tract: No adrenal abnormality. No focal renal abnormality. No stones or hydronephrosis. Urinary bladder is unremarkable. Stomach/Bowel: Stomach, large and small bowel grossly unremarkable. Vascular/Lymphatic: No evidence of aneurysm or adenopathy. Reproductive: Prior hysterectomy.  No adnexal masses. Other:  No free fluid or free air. Musculoskeletal: Postoperative changes in the lower lumbar spine. No acute bony abnormality. IMPRESSION: No acute findings in the abdomen or pelvis. Trace bilateral pleural effusions. Old granulomatous disease of the spleen. Electronically Signed   By: Rolm Baptise M.D.   On: 08/29/2021 15:08   DG Chest 2 View  Result Date:  08/29/2021 CLINICAL DATA:  Provided history: Chest pain. Additional history provided: Headache, nausea/vomiting/diarrhea for 2 days. History of CHF, CKD, anemia. EXAM: CHEST - 2 VIEW COMPARISON:  Prior chest radiographs 08/05/2018 FINDINGS: Left chest infusion port catheter with tip projecting at the level of the superior cavoatrial junction. Spinal stimulator leads within the thoracic spinal canal. Heart size within normal limits. Bronchial wall thickening with possible bronchiectasis in the medial left upper lobe. No appreciable  airspace consolidation. No evidence of pleural effusion or pneumothorax. No acute bony abnormality identified. IMPRESSION: No appreciable airspace consolidation. Bronchial wall thickening within the medial left upper lobe, consistent with the patient's history of COPD. Bronchiectasis may also be present at this site. Electronically Signed   By: Kellie Simmering D.O.   On: 08/29/2021 12:49    Procedures Procedures    Medications Ordered in ED Medications  potassium chloride 10 mEq in 100 mL IVPB ( Intravenous Infusion Verify 08/29/21 1652)  ondansetron (ZOFRAN-ODT) disintegrating tablet 4 mg (4 mg Oral Given 08/29/21 1346)  acetaminophen (TYLENOL) tablet 650 mg (650 mg Oral Given 08/29/21 1437)  metoCLOPramide (REGLAN) injection 10 mg (10 mg Intravenous Given 08/29/21 1509)  morphine (PF) 2 MG/ML injection 2 mg (2 mg Intravenous Given 08/29/21 1511)  sodium chloride 0.9 % bolus 500 mL (500 mLs Intravenous New Bag/Given 08/29/21 1546)    ED Course/ Medical Decision Making/ A&P                           Medical Decision Making Patient here with significant past medical history, reports burning chest pain, upper abdominal pain, nausea vomiting and diarrhea.  Diarrhea reported as chronic and associated with IBS.  Nausea vomiting present for 2 days began after eating [employer].  Reports unable to tolerate any liquids or solid foods without vomiting.  No hematemesis.  History of CKD  and recently evaluated by nephrology.  Has not been started on dialysis.  Continues to make small amount of urine.  On exam, patient appears uncomfortable, nontoxic.  No symptoms suggestive of sepsis.  No active vomiting during my exam.  Mucous membranes are moist.  At this point, differential would include acute abdominal process, reflux, viral process, ACS, electrolyte abnormality.  Amount and/or Complexity of Data Reviewed External Data Reviewed: notes.    Details: External medical records reviewed Labs: ordered.    Details: Labs interpreted by me, significant leukocytosis with white count of 23,000.  Hemoglobin 8.8, same from yesterday.  Hemoglobin was 7.6 in April.Chemistries show hypokalemia with potassium of 3.1.  Kidney functions continuing to decline.  Worsening serum creatinine today 3.47, yesterday 2.99, 4/26 was 2.28.  Magnesium unremarkable, troponins flat Radiology: ordered.    Details: CXR w/o evidence of consolidation.  Bronchial wall thickening consistent with COPD CT abdomen and pelvis without acute findings ECG/medicine tests: ordered.    Details: EKG shows sinus rhythm reviewed by Dr. Langston Masker Discussion of management or test interpretation with external provider(s): Work-up today without evidence of ACS.  She does have leukocytosis, source unclear at this time.  No reported recent steroid use.  Urinalysis pending.  Patient reports very little urine output at this point.  No concerning symptoms for sepsis.  She does have worsening kidney function worse from 08/08/2021.  Likely AKI possible component of dehydration given patient's reported vomiting and diarrhea.  Discussed findings with [facility] hospitalist, Dr. Arlyce Dice who is agreeable to admit.          Final Clinical Impression(s) / ED Diagnoses Final diagnoses:  AKI (acute kidney injury) ([place])  Nausea vomiting and diarrhea    Rx / DC Orders ED Discharge Orders     None         Kem Parkinson,  Hershal Coria 08/29/21 1903    Wyvonnia Dusky, MD 09/01/21 [phone]

## 2021-08-29 NOTE — Assessment & Plan Note (Addendum)
-  Do not appear clinically infected on exam -Remain off antibiotics -X-ray of bilateral heels--negative for bony abnormality -08/30/2021 MRI left heel--mild patchy subcortical bone marrow edema at the posterior calcaneus -Request general surgery--no need for debridement at this point -ESR---80 -CRP--1.5 -Patient evaluated by wound care>> follow recommendations -Place Prevalon boots when the patient is in bed to offload pressure -She will need outpatient vascular follow-up>> ABIs nondiagnostic secondary to noncompressible arteries of the ankle

## 2021-08-30 ENCOUNTER — Inpatient Hospital Stay (HOSPITAL_COMMUNITY): Payer: 59

## 2021-08-30 ENCOUNTER — Other Ambulatory Visit: Payer: Self-pay

## 2021-08-30 DIAGNOSIS — L97409 Non-pressure chronic ulcer of unspecified heel and midfoot with unspecified severity: Secondary | ICD-10-CM | POA: Diagnosis not present

## 2021-08-30 DIAGNOSIS — E08621 Diabetes mellitus due to underlying condition with foot ulcer: Secondary | ICD-10-CM

## 2021-08-30 DIAGNOSIS — N189 Chronic kidney disease, unspecified: Secondary | ICD-10-CM | POA: Diagnosis not present

## 2021-08-30 DIAGNOSIS — N179 Acute kidney failure, unspecified: Secondary | ICD-10-CM | POA: Diagnosis not present

## 2021-08-30 LAB — CBG MONITORING, ED
Glucose-Capillary: 89 mg/dL (ref 70–99)
Glucose-Capillary: 87 mg/dL (ref 70–99)
Glucose-Capillary: 102 mg/dL — ABNORMAL HIGH (ref 70–99)
Glucose-Capillary: 135 mg/dL — ABNORMAL HIGH (ref 70–99)

## 2021-08-30 LAB — SEDIMENTATION RATE: Sed Rate: 80 mm/hr — ABNORMAL HIGH (ref 0–22)

## 2021-08-30 LAB — BASIC METABOLIC PANEL
Anion gap: 11 (ref 5–15)
BUN: 42 mg/dL — ABNORMAL HIGH (ref 6–20)
CO2: 37 mmol/L — ABNORMAL HIGH (ref 22–32)
Calcium: 7.5 mg/dL — ABNORMAL LOW (ref 8.9–10.3)
Chloride: 91 mmol/L — ABNORMAL LOW (ref 98–111)
Creatinine, Ser: 3.28 mg/dL — ABNORMAL HIGH (ref 0.44–1.00)
GFR, Estimated: 16 mL/min — ABNORMAL LOW (ref >60)
Glucose, Bld: 91 mg/dL (ref 70–99)
Potassium: 2.9 mmol/L — ABNORMAL LOW (ref 3.5–5.1)
Sodium: 139 mmol/L (ref 135–145)

## 2021-08-30 LAB — CBC
HCT: 23.9 % — ABNORMAL LOW (ref 36.0–46.0)
Hemoglobin: 7.1 g/dL — ABNORMAL LOW (ref 12.0–15.0)
MCH: 24.4 pg — ABNORMAL LOW (ref 26.0–34.0)
MCHC: 29.7 g/dL — ABNORMAL LOW (ref 30.0–36.0)
MCV: 82.1 fL (ref 80.0–100.0)
Platelets: 269 10*3/uL (ref 150–400)
RBC: 2.91 MIL/uL — ABNORMAL LOW (ref 3.87–5.11)
RDW: 16.8 % — ABNORMAL HIGH (ref 11.5–15.5)
WBC: 23.4 10*3/uL — ABNORMAL HIGH (ref 4.0–10.5)
nRBC: 0 % (ref 0.0–0.2)

## 2021-08-30 LAB — HEMOGLOBIN A1C
Hgb A1c MFr Bld: 7.3 % — ABNORMAL HIGH (ref 4.8–5.6)
Mean Plasma Glucose: 162.81 mg/dL

## 2021-08-30 LAB — C-REACTIVE PROTEIN: CRP: 1.5 mg/dL — ABNORMAL HIGH (ref <1.0)

## 2021-08-30 LAB — HIV ANTIBODY (ROUTINE TESTING W REFLEX): HIV Screen 4th Generation wRfx: NONREACTIVE

## 2021-08-30 LAB — GLUCOSE, CAPILLARY
Glucose-Capillary: 118 mg/dL — ABNORMAL HIGH (ref 70–99)
Glucose-Capillary: 157 mg/dL — ABNORMAL HIGH (ref 70–99)
Glucose-Capillary: 162 mg/dL — ABNORMAL HIGH (ref 70–99)

## 2021-08-30 MED ORDER — HYDROCODONE-ACETAMINOPHEN 5-325 MG PO TABS
1.0000 | ORAL_TABLET | Freq: Once | ORAL | Status: AC
  Administered 2021-08-30: 1 via ORAL
  Filled 2021-08-30: qty 1

## 2021-08-30 MED ORDER — POVIDONE-IODINE 5 % EX SOLN: Freq: Every day | CUTANEOUS | Status: DC

## 2021-08-30 MED ORDER — POTASSIUM CHLORIDE CRYS ER 20 MEQ PO TBCR
40.0000 meq | EXTENDED_RELEASE_TABLET | Freq: Two times a day (BID) | ORAL | Status: AC
  Administered 2021-08-30 (×2): 40 meq via ORAL
  Filled 2021-08-30 (×2): qty 2

## 2021-08-30 MED ORDER — SODIUM CHLORIDE 0.9 % IV SOLN: INTRAVENOUS | Status: AC

## 2021-08-30 MED ORDER — GABAPENTIN 100 MG PO CAPS
200.0000 mg | ORAL_CAPSULE | Freq: Two times a day (BID) | ORAL | Status: DC
Start: 2021-08-30 — End: 2021-09-01
  Administered 2021-08-30 – 2021-09-01 (×5): 200 mg via ORAL
  Filled 2021-08-30 (×5): qty 2

## 2021-08-30 NOTE — ED Notes (Signed)
Pt given meal. Pt hooked back up to cardiac monitor and CBG checked. CBG is 87, nurse notified.

## 2021-08-30 NOTE — Consult Note (Signed)
WOC Nurse Consult Note: Reason for Consult:Bilateral pressure injuries to heels (Deep tissue pressure injuries in evolution). Currently Stage 3. Wound type:Pressure vs trauma vs ischemia injury Pressure Injury POA: Yes Measurement: To be obtained by bedside RN and documented on Nursing flow sheet with application of first dressing today Wound bed: Blood blisters that have dried and formed eschar, now partially removed to reveal healing dermis Drainage (amount, consistency, odor) None Periwound: with residual eschar and areas of Stage 3 pressure injury Dressing procedure/placement/frequency: Dr. Broadus John has indicated that she will obtain an ABI to determine if the injury is of an ischemic nature. General Surgery is also to be consulted. In the interim, I will provide pressure redistribution heel boots to enhance the topical care, which will consist of daily washing of the lesions with soap and water, rinsing with NS and drying, then covering with an antimicrobial nonadherent (xeroform gauze) topped with dry gauze and secured with silicone foam.  Any orders provided by Surgery will supercede those of this Probation officer.  [place] nursing team will not follow, but will remain available to this patient, the nursing and medical teams.  Please re-consult if needed. Thanks, Maudie Flakes, MSN, RN, [place], Arther Abbott  Pager# [phone]

## 2021-08-30 NOTE — TOC Initial Note (Signed)
Transition of Care [facility]) - Initial/Assessment Note    Patient Details  Name: Tracy Walsh MRN: [id number] Date of Birth: Jun 25, 1965  [facility] CM/SW Contact:    Tracy Walsh, [place] Phone Number: 08/30/2021, 1:46 PM  Clinical Narrative:                 Pt is high risk for readmission. CSW spoke with pt and husband in room to complete assessment. Pt lives with her husband. Pt is mostly independent but her husband can provide assistance as needed. Pts husband provides transportation. Pt states that she has had HH in the past. Pt has a cane, walker, wheelchair, shower chair, and O2 that she uses at home. Pt and husband inquired about where they can get incontinence supplies as outside provider wrote a script for pull ups. CSW reached out to [place] with [employer], awaiting response. TOC to follow.  Expected Discharge Plan: Home/Self Care Barriers to Discharge: Continued Medical Work up   Patient Goals and CMS Choice Patient states their goals for this hospitalization and ongoing recovery are:: Return home      Expected Discharge Plan and Services Expected Discharge Plan: Home/Self Care In-house Referral: Clinical Social Work     Living arrangements for the past 2 months: Single Family Home                                      Prior Living Arrangements/Services Living arrangements for the past 2 months: Single Family Home Lives with:: Spouse Patient language and need for interpreter reviewed:: Yes Do you feel safe going back to the place where you live?: Yes      Need for Family Participation in Patient Care: Yes (Comment) Care giver support system in place?: Yes (comment) Current home services: DME Criminal Activity/Legal Involvement Pertinent to Current Situation/Hospitalization: No - Comment as needed  Activities of Daily Living      Permission Sought/Granted                  Emotional Assessment Appearance:: Appears stated  age Attitude/Demeanor/Rapport: Engaged Affect (typically observed): Accepting Orientation: : Oriented to Self, Oriented to Place, Oriented to  Time, Oriented to Situation Alcohol / Substance Use: Not Applicable Psych Involvement: No (comment)  Admission diagnosis:  AKI (acute kidney injury) ([place]) [N17.9] Patient Active Problem List   Diagnosis Date Noted   Acute kidney injury superimposed on chronic kidney disease ([place]) 08/29/2021   Chronic diarrhea 08/29/2021   Hypokalemia 08/29/2021   Chest pain 08/29/2021   Congestive heart failure (CHF) ([place]) 08/29/2021   Essential hypertension 08/29/2021   Diabetes mellitus with neuropathy ([place]) 08/29/2021   Diabetic foot ulcer ([place]) 08/29/2021   CAD (coronary artery disease) 08/29/2021   AKI (acute kidney injury) ([place]) 08/29/2021   PCP:  Tracy Burly, MD Pharmacy:   [id number] - [place], [place] [address] [place] [address] Phone: [phone] Fax: [phone]     Social Determinants of Health (SDOH) Interventions    Readmission Risk Interventions    08/30/2021    1:38 PM  Readmission Risk Prevention Plan  Transportation Screening Complete  Home Care Screening Complete  Medication Review (RN CM) Complete

## 2021-08-30 NOTE — Progress Notes (Signed)
PROGRESS NOTE    Tracy Walsh  OFB:[id number] DOB: 1965/12/22 DOA: 08/29/2021 PCP: Neale Burly, MD   Tracy Walsh is a 56 y.o. female with medical history significant for congestive heart failure, CIDP, CKD, COPD, diabetes mellitus. Patient presented to the ED with complaints of vomiting chest pain and chronic diarrhea.  Vomiting started 2 to 3 days ago after she ate at [facility], reported numerous episodes, 15-20 of vomiting, denies any hematemesis. -has chronic diarrhea that is unchanged.  -In the ED she was noted to have potassium of 3.1, creatinine of 3.4, chest x-ray suggestive of COPD, CT abdomen pelvis noted trace bilateral effusions  Subjective: Feels better, denies any abdominal pain, no further vomiting, denies any shortness of breath  Assessment and Plan:  Acute kidney injury superimposed on chronic kidney disease (HCC) - Creatinine 3.4 on admission, recent baseline is 2.2 in Care Everywhere  -Acute worsening most likely prerenal secondary to GI losses, CT abdomen pelvis is unremarkable  -Continue IV fluids 1 more day, hold Lasix dose today  -She is followed by Dr.Bhutani with nephrology -Monitor urine output, BMP in a.m.  Nausea and vomiting -Likely gastroenteritis versus food poisoning, appears to be resolving, CT abdomen pelvis is unremarkable, continue clears today, advance diet as tolerated  Bilateral heel ulcers -Identical areas of ulcers, and discoloration on both heels, right worse than left -Does not appear infected, patient reports using shoe in the last few months which caused blisters, this could have contributed given identical bilateral appearance -She also has diminished pulses -Check ABI, wound care consulted -Admitting MD also consulted general surgery for evaluation -Follow-up ESR, CRP  CAD (coronary artery disease) - Troponins, EKG unremarkable.  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  She is on  Plavix. -Resume Plavix, Imdur, metoprolol, atorvastatin  Essential hypertension Stable. -Continue med reconciliation resume home medications, Imdur, metoprolol, hydralazine  Congestive heart failure (CHF) ([place]) Unspecified type.  On Lasix 80 mg daily at baseline.   -Gentle hydration for  12 more hours -Hold Lasix with AKI on CKD  Hypokalemia Potassium 3.1, magnesium 1.8.  Likely from GI losses.  Replete.  Chronic diarrhea At baseline has 4-6 bowel movements daily.  She reports colonoscopies, stool studies, negative stool C. difficile within the past year.  Bowel pattern today unchanged. -Loperamide as needed   DVT prophylaxis: Heparin subcutaneous Code Status: Full code Family Communication: Discussed with patient in detail, no family at bedside Disposition Plan: Home likely 24 to 48 hours  Consultants:    Procedures:   Antimicrobials:    Objective: Vitals:   08/30/21 0400 08/30/21 0430 08/30/21 0500 08/30/21 0700  BP: (!) 150/76 (!) 158/82 (!) 145/77 (!) 152/81  Pulse: 87 87 88 88  Resp: (!) 9 15 (!) 23 16  Temp:      TempSrc:      SpO2: 96% 100% 98% 100%  Weight:      Height:        Intake/Output Summary (Last 24 hours) at 08/30/2021 0748 Last data filed at 08/29/2021 2314 Gross per 24 hour  Intake 694.29 ml  Output 1 ml  Net 693.29 ml   Filed Weights   08/29/21 1140  Weight: 80.7 kg    Examination:  General exam: Pleasant chronically ill female laying in bed, AAOx3, no distress HEENT: No JVD CVS: S1-S2, regular rhythm Lungs: Decreased breath sounds the bases otherwise clear Abdomen: Soft, nontender, bowel sounds present Extremities: Trace edema  Skin:  Chronic ulcerations to both heels with worsening discoloration on the left, minimal drainage, diminished pulses Psychiatry:  Mood & affect appropriate.     Data Reviewed:   CBC: Recent Labs  Lab 08/29/21 1226 08/30/21 0346  WBC 23.6* 23.4*  HGB 8.8* 7.1*  HCT 28.1* 23.9*  MCV 82.4 82.1   PLT 347 295   Basic Metabolic Panel: Recent Labs  Lab 08/29/21 1226 08/29/21 1412 08/30/21 0346  NA 143  --  139  K 3.1*  --  2.9*  CL 83*  --  91*  CO2 44*  --  37*  GLUCOSE 134*  --  91  BUN 45*  --  42*  CREATININE 3.47*  --  3.28*  CALCIUM 8.6*  --  7.5*  MG  --  1.8  --    GFR: Estimated Creatinine Clearance: 21.9 mL/min (A) (by C-G formula based on SCr of 3.28 mg/dL (H)). Liver Function Tests: Recent Labs  Lab 08/29/21 1226  AST 16  ALT 16  ALKPHOS 107  BILITOT 0.5  PROT 8.0  ALBUMIN 3.1*   Recent Labs  Lab 08/29/21 1226  LIPASE 27   No results for input(s): AMMONIA in the last 168 hours. Coagulation Profile: No results for input(s): INR, PROTIME in the last 168 hours. Cardiac Enzymes: No results for input(s): CKTOTAL, CKMB, CKMBINDEX, TROPONINI in the last 168 hours. BNP (last 3 results) No results for input(s): PROBNP in the last 8760 hours. HbA1C: No results for input(s): HGBA1C in the last 72 hours. CBG: Recent Labs  Lab 08/29/21 2324 08/30/21 0350  GLUCAP 74 89   Lipid Profile: No results for input(s): CHOL, HDL, LDLCALC, TRIG, CHOLHDL, LDLDIRECT in the last 72 hours. Thyroid Function Tests: No results for input(s): TSH, T4TOTAL, FREET4, T3FREE, THYROIDAB in the last 72 hours. Anemia Panel: No results for input(s): VITAMINB12, FOLATE, FERRITIN, TIBC, IRON, RETICCTPCT in the last 72 hours. Urine analysis: No results found for: COLORURINE, APPEARANCEUR, LABSPEC, PHURINE, GLUCOSEU, HGBUR, BILIRUBINUR, KETONESUR, PROTEINUR, UROBILINOGEN, NITRITE, LEUKOCYTESUR Sepsis Labs: @LABRCNTIP (procalcitonin:4,lacticidven:4)  )No results found for this or any previous visit (from the past 240 hour(s)).   Radiology Studies: CT ABDOMEN PELVIS WO CONTRAST  Result Date: 08/29/2021 CLINICAL DATA:  Abdominal pain, acute, nonlocalized EXAM: CT ABDOMEN AND PELVIS WITHOUT CONTRAST TECHNIQUE: Multidetector CT imaging of the abdomen and pelvis was performed  following the standard protocol without IV contrast. RADIATION DOSE REDUCTION: This exam was performed according to the departmental dose-optimization program which includes automated exposure control, adjustment of the mA and/or kV according to patient size and/or use of iterative reconstruction technique. COMPARISON:  None Available. FINDINGS: Lower chest: Trace bilateral pleural effusions, right greater than left. Hepatobiliary: No focal liver abnormality is seen. Status post cholecystectomy. No biliary dilatation. Pancreas: No focal abnormality or ductal dilatation. Spleen: Calcifications throughout the spleen compatible with old granulomatous disease. Normal size. Adrenals/Urinary Tract: No adrenal abnormality. No focal renal abnormality. No stones or hydronephrosis. Urinary bladder is unremarkable. Stomach/Bowel: Stomach, large and small bowel grossly unremarkable. Vascular/Lymphatic: No evidence of aneurysm or adenopathy. Reproductive: Prior hysterectomy.  No adnexal masses. Other:  No free fluid or free air. Musculoskeletal: Postoperative changes in the lower lumbar spine. No acute bony abnormality. IMPRESSION: No acute findings in the abdomen or pelvis. Trace bilateral pleural effusions. Old granulomatous disease of the spleen. Electronically Signed   By: Rolm Baptise M.D.   On: 08/29/2021 15:08   DG Chest 2 View  Result Date: 08/29/2021 CLINICAL DATA:  Provided history: Chest pain. Additional  history provided: Headache, nausea/vomiting/diarrhea for 2 days. History of CHF, CKD, anemia. EXAM: CHEST - 2 VIEW COMPARISON:  Prior chest radiographs 08/05/2018 FINDINGS: Left chest infusion port catheter with tip projecting at the level of the superior cavoatrial junction. Spinal stimulator leads within the thoracic spinal canal. Heart size within normal limits. Bronchial wall thickening with possible bronchiectasis in the medial left upper lobe. No appreciable airspace consolidation. No evidence of pleural  effusion or pneumothorax. No acute bony abnormality identified. IMPRESSION: No appreciable airspace consolidation. Bronchial wall thickening within the medial left upper lobe, consistent with the patient's history of COPD. Bronchiectasis may also be present at this site. Electronically Signed   By: Kellie Simmering D.O.   On: 08/29/2021 12:49   DG Foot 2 Views Left  Result Date: 08/29/2021 CLINICAL DATA:  Diabetic ulcer on the heel. EXAM: LEFT FOOT - 2 VIEW COMPARISON:  Left foot x-ray 08/04/2021 FINDINGS: There is soft tissue ulceration superficially overlying the posterior calcaneus. There is no radiopaque foreign body. Peripheral vascular calcifications are present. There is no underlying osseous erosion or periosteal reaction. There is no acute fracture or dislocation. A small posterior calcaneal spur is present. IMPRESSION: 1. Soft tissue ulceration overlying the calcaneus. 2. No acute bony abnormality. Electronically Signed   By: Ronney Asters M.D.   On: 08/29/2021 22:27   DG Foot 2 Views Right  Result Date: 08/29/2021 CLINICAL DATA:  Heel ulcer for 1 month, no known injury, initial encounter EXAM: RIGHT FOOT - 2 VIEW COMPARISON:  None Available. FINDINGS: No acute fracture or dislocation is noted. No gross soft tissue abnormality is noted. Calcaneal spurring is seen. No erosive changes are noted. IMPRESSION: Degenerative change without acute abnormality Electronically Signed   By: Inez Catalina M.D.   On: 08/29/2021 22:27     Scheduled Meds:  aspirin EC  81 mg Oral Daily   atorvastatin  40 mg Oral Daily   clopidogrel  75 mg Oral Daily   heparin  5,000 Units Subcutaneous Q8H   insulin aspart  0-9 Units Subcutaneous Q4H   isosorbide mononitrate  30 mg Oral Daily   metoprolol succinate  50 mg Oral Daily   potassium chloride  40 mEq Oral BID   Continuous Infusions:  sodium chloride 75 mL/hr at 08/30/21 0715     LOS: 1 day    Time spent: 68mn    PDomenic Polite MD [facility]   08/30/2021, 7:48 AM

## 2021-08-30 NOTE — TOC Progression Note (Signed)
  Transition of Care [facility]) Screening Note   Patient Details  Name: Tracy Walsh Date of Birth: 10-18-1965   Transition of Care [facility]) CM/SW Contact:    Shade Flood, LCSW Phone Number: 08/30/2021, 9:52 AM    Transition of Care Department [facility]) has reviewed patient and no TOC needs have been identified at this time. We will continue to monitor patient advancement through interdisciplinary progression rounds. If new patient transition needs arise, please place a TOC consult.

## 2021-08-30 NOTE — Consult Note (Signed)
[facility] Consult  Reason for Consult: Left heel ulcer  Referring Physician: Dr. Broadus John   Chief Complaint   Chest Pain; Emesis; Diarrhea     HPI: Tracy Walsh is a 56 y.o. female with CHF, COPD, Acute on chronic renal failure, diabetic foot ulcers with a left heel ulcer that has been present for over 4 weeks she reports. She has had prior right foot ulcers in the past requiring debridement and antibiotics at [facility] in [place].  She had been followed there and had arteriogram with IR it looks like that demonstrated severe right SFA stenosis and had a stent placed and had mild to moderate distal left SFA narrowing in 2021.    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   Past Medical History:  Diagnosis Date   CHF (congestive heart failure) (HCC)    CIDP (chronic inflammatory demyelinating polyneuropathy) (HCC)    CKD (chronic kidney disease)    COPD (chronic obstructive pulmonary disease) (HCC)    Diabetes mellitus without complication (HCC)    Diabetic foot ulcers (HCC)    GERD (gastroesophageal reflux disease)    Migraines    Neuropathy     Past Surgical History:  Procedure Laterality Date   ABDOMINAL HYSTERECTOMY     CHOLECYSTECTOMY     CORONARY ANGIOPLASTY WITH STENT PLACEMENT     SHOULDER SURGERY Bilateral    SPINAL FUSION      No family history on file.  Social History   Tobacco Use   Smoking status: Every Day    Packs/day: 0.50    Types: Cigarettes   Smokeless tobacco: Never  Vaping Use   Vaping Use: Never used  Substance Use Topics   Alcohol use: Never   Drug use: Never    Medications: I have reviewed the patient's current medications. Prior to Admission: (Not in a hospital admission)  Scheduled:  aspirin EC  81 mg Oral Daily   atorvastatin  40 mg Oral Daily   clopidogrel  75 mg Oral Daily   gabapentin  200 mg Oral BID   heparin  5,000  Units Subcutaneous Q8H   insulin aspart  0-9 Units Subcutaneous Q4H   isosorbide mononitrate  30 mg Oral Daily   metoprolol succinate  50 mg Oral Daily   potassium chloride  40 mEq Oral BID   Continuous:  sodium chloride 75 mL/hr at 08/30/21 1610   RUE:[id number] **OR** acetaminophen, loperamide, promethazine  Allergies  Allergen Reactions   Amoxicillin Anaphylaxis   Penicillins Anaphylaxis   Sulfa Antibiotics Anaphylaxis     ROS:  A comprehensive review of systems was negative except for: Constitutional: positive for weakness Cardiovascular: positive for chest pain Gastrointestinal: positive for diarrhea and vomiting Musculoskeletal: positive for lower leg pain and weakness, left heel ulcer  Blood pressure (!) 149/80, pulse 88, temperature 98 F (36.7 C), resp. rate 14, height 5\' 8"  (1.727 m), weight 80.7 kg, SpO2 99 %. Physical Exam Vitals reviewed.  Constitutional:      Appearance: She is well-developed.  HENT:     Head: Normocephalic.  Cardiovascular:     Rate and Rhythm: Normal rate.     Pulses:          Dorsalis pedis pulses are 2+ on the right side and 1+ on the left side.       Posterior tibial pulses are 2+ on the right side and 1+  on the left side.  Pulmonary:     Effort: Pulmonary effort is normal.  Abdominal:     Palpations: Abdomen is soft.  Musculoskeletal:     Right lower leg: No edema.     Left lower leg: No edema.     Comments: Right heel ulcer superficial and dry, no erythema or drainage, left foot heel ulcer with eschar and dry, no drainage or expressed fluid, edges dry unstageable, no erythema   Skin:    General: Skin is warm.  Neurological:     General: No focal deficit present.     Mental Status: She is alert.  Psychiatric:        Mood and Affect: Mood normal.   Right foot     Left foot      Results: Results for orders placed or performed during the hospital encounter of 08/29/21 (from the past 48 hour(s))  CBC     Status:  Abnormal   Collection Time: 08/29/21 12:26 PM  Result Value Ref Range   WBC 23.6 (H) 4.0 - 10.5 K/uL   RBC 3.41 (L) 3.87 - 5.11 MIL/uL   Hemoglobin 8.8 (L) 12.0 - 15.0 g/dL   HCT 28.1 (L) 36.0 - 46.0 %   MCV 82.4 80.0 - 100.0 fL   MCH 25.8 (L) 26.0 - 34.0 pg   MCHC 31.3 30.0 - 36.0 g/dL   RDW 17.2 (H) 11.5 - 15.5 %   Platelets 347 150 - 400 K/uL   nRBC 0.0 0.0 - 0.2 %    Comment: Performed at [facility], [address] [place], [place] [address]  Troponin I (High Sensitivity)     Status: Abnormal   Collection Time: 08/29/21 12:26 PM  Result Value Ref Range   Troponin I (High Sensitivity) 23 (H) <18 ng/L    Comment: (NOTE) Elevated high sensitivity troponin I (hsTnI) values and significant  changes across serial measurements may suggest ACS but many other  chronic and acute conditions are known to elevate hsTnI results.  Refer to the "Links" section for chest pain algorithms and additional  guidance. Performed at [facility], [address], [place], [place] [address]   Comprehensive metabolic panel     Status: Abnormal   Collection Time: 08/29/21 12:26 PM  Result Value Ref Range   Sodium 143 135 - 145 mmol/L   Potassium 3.1 (L) 3.5 - 5.1 mmol/L   Chloride 83 (L) 98 - 111 mmol/L   CO2 44 (H) 22 - 32 mmol/L   Glucose, Bld 134 (H) 70 - 99 mg/dL    Comment: Glucose reference range applies only to samples taken after fasting for at least 8 hours.   BUN 45 (H) 6 - 20 mg/dL   Creatinine, Ser 3.47 (H) 0.44 - 1.00 mg/dL   Calcium 8.6 (L) 8.9 - 10.3 mg/dL   Total Protein 8.0 6.5 - 8.1 g/dL   Albumin 3.1 (L) 3.5 - 5.0 g/dL   AST 16 15 - 41 U/L   ALT 16 0 - 44 U/L   Alkaline Phosphatase 107 38 - 126 U/L   Total Bilirubin 0.5 0.3 - 1.2 mg/dL   GFR, Estimated 15 (L) >60 mL/min    Comment: (NOTE) Calculated using the CKD-EPI Creatinine Equation (2021)    Anion gap 16 (H) 5 - 15    Comment: Performed at [facility], [address], [place],  [address]  Lipase, blood      Status: None   Collection Time: 08/29/21 12:26 PM  Result Value Ref Range   Lipase 27 11 - 51 U/L    Comment: Performed at [facility], [address], [place], [place] [address]  Troponin I (High Sensitivity)     Status: Abnormal   Collection Time: 08/29/21  2:12 PM  Result Value Ref Range   Troponin I (High Sensitivity) 22 (H) <18 ng/L    Comment: (NOTE) Elevated high sensitivity troponin I (hsTnI) values and significant  changes across serial measurements may suggest ACS but many other  chronic and acute conditions are known to elevate hsTnI results.  Refer to the "Links" section for chest pain algorithms and additional  guidance. Performed at [facility], [address], [place], [place] [address]   Magnesium     Status: None   Collection Time: 08/29/21  2:12 PM  Result Value Ref Range   Magnesium 1.8 1.7 - 2.4 mg/dL    Comment: Performed at [facility], [address], [place], [place] [address]  CBG monitoring, ED     Status: None   Collection Time: 08/29/21 11:24 PM  Result Value Ref Range   Glucose-Capillary 74 70 - 99 mg/dL    Comment: Glucose reference range applies only to samples taken after fasting for at least 8 hours.  Sedimentation rate     Status: Abnormal   Collection Time: 08/30/21  3:46 AM  Result Value Ref Range   Sed Rate 80 (H) 0 - 22 mm/hr    Comment: Performed at [facility], [address] [place], [place] [address]  C-reactive protein     Status: Abnormal   Collection Time: 08/30/21  3:46 AM  Result Value Ref Range   CRP 1.5 (H) <1.0 mg/dL    Comment: Performed at [facility] [address] [place], [place] [address]  Hemoglobin A1c     Status: Abnormal   Collection Time: 08/30/21  3:46 AM  Result Value Ref Range   Hgb A1c MFr Bld 7.3 (H) 4.8 - 5.6 %    Comment: (NOTE) Pre diabetes:          5.7%-6.4%  Diabetes:              >6.4%  Glycemic control for   <7.0% adults with diabetes    Mean Plasma Glucose 162.81 mg/dL    Comment:  Performed at [facility] [address] [place], [place] [address]  HIV Antibody (routine testing w rflx)     Status: None   Collection Time: 08/30/21  3:46 AM  Result Value Ref Range   HIV Screen 4th Generation wRfx Non Reactive Non Reactive    Comment: Performed at [facility], [place] [address], [place], [place] [address]  Basic metabolic panel     Status: Abnormal   Collection Time: 08/30/21  3:46 AM  Result Value Ref Range   Sodium 139 135 - 145 mmol/L   Potassium 2.9 (L) 3.5 - 5.1 mmol/L   Chloride 91 (L) 98 - 111 mmol/L   CO2 37 (H) 22 - 32 mmol/L   Glucose, Bld 91 70 - 99 mg/dL    Comment: Glucose reference range applies only to samples taken after fasting for at least 8 hours.   BUN 42 (H) 6 - 20 mg/dL   Creatinine, Ser 3.28 (H) 0.44 - 1.00 mg/dL   Calcium 7.5 (L) 8.9 - 10.3 mg/dL   GFR, Estimated 16 (L) >60 mL/min    Comment: (NOTE) Calculated using the CKD-EPI Creatinine Equation (2021)  Anion gap 11 5 - 15    Comment: Performed at [facility], [address], [place], [place] [address]  CBC     Status: Abnormal   Collection Time: 08/30/21  3:46 AM  Result Value Ref Range   WBC 23.4 (H) 4.0 - 10.5 K/uL   RBC 2.91 (L) 3.87 - 5.11 MIL/uL   Hemoglobin 7.1 (L) 12.0 - 15.0 g/dL   HCT 23.9 (L) 36.0 - 46.0 %   MCV 82.1 80.0 - 100.0 fL   MCH 24.4 (L) 26.0 - 34.0 pg   MCHC 29.7 (L) 30.0 - 36.0 g/dL   RDW 16.8 (H) 11.5 - 15.5 %   Platelets 269 150 - 400 K/uL   nRBC 0.0 0.0 - 0.2 %    Comment: Performed at [facility], [address] [place], [place] [address]  CBG monitoring, ED     Status: None   Collection Time: 08/30/21  3:50 AM  Result Value Ref Range   Glucose-Capillary 89 70 - 99 mg/dL    Comment: Glucose reference range applies only to samples taken after fasting for at least 8 hours.  CBG monitoring, ED     Status: None   Collection Time: 08/30/21  8:47 AM  Result Value Ref Range   Glucose-Capillary 87 70 - 99 mg/dL    Comment: Glucose reference  range applies only to samples taken after fasting for at least 8 hours.  CBG monitoring, ED     Status: Abnormal   Collection Time: 08/30/21 11:43 AM  Result Value Ref Range   Glucose-Capillary 102 (H) 70 - 99 mg/dL    Comment: Glucose reference range applies only to samples taken after fasting for at least 8 hours.   Personally reviewed MRI left  foot- superficial ulcer without abscess or collection, fatty tissue between the ulcer and bone, reactive findings in the bone CT ABDOMEN PELVIS WO CONTRAST  Result Date: 08/29/2021 CLINICAL DATA:  Abdominal pain, acute, nonlocalized EXAM: CT ABDOMEN AND PELVIS WITHOUT CONTRAST TECHNIQUE: Multidetector CT imaging of the abdomen and pelvis was performed following the standard protocol without IV contrast. RADIATION DOSE REDUCTION: This exam was performed according to the departmental dose-optimization program which includes automated exposure control, adjustment of the mA and/or kV according to patient size and/or use of iterative reconstruction technique. COMPARISON:  None Available. FINDINGS: Lower chest: Trace bilateral pleural effusions, right greater than left. Hepatobiliary: No focal liver abnormality is seen. Status post cholecystectomy. No biliary dilatation. Pancreas: No focal abnormality or ductal dilatation. Spleen: Calcifications throughout the spleen compatible with old granulomatous disease. Normal size. Adrenals/Urinary Tract: No adrenal abnormality. No focal renal abnormality. No stones or hydronephrosis. Urinary bladder is unremarkable. Stomach/Bowel: Stomach, large and small bowel grossly unremarkable. Vascular/Lymphatic: No evidence of aneurysm or adenopathy. Reproductive: Prior hysterectomy.  No adnexal masses. Other:  No free fluid or free air. Musculoskeletal: Postoperative changes in the lower lumbar spine. No acute bony abnormality. IMPRESSION: No acute findings in the abdomen or pelvis. Trace bilateral pleural effusions. Old granulomatous  disease of the spleen. Electronically Signed   By: Rolm Baptise M.D.   On: 08/29/2021 15:08   DG Chest 2 View  Result Date: 08/29/2021 CLINICAL DATA:  Provided history: Chest pain. Additional history provided: Headache, nausea/vomiting/diarrhea for 2 days. History of CHF, CKD, anemia. EXAM: CHEST - 2 VIEW COMPARISON:  Prior chest radiographs 08/05/2018 FINDINGS: Left chest infusion port catheter with tip projecting at the level of the superior cavoatrial junction. Spinal stimulator leads within the thoracic  spinal canal. Heart size within normal limits. Bronchial wall thickening with possible bronchiectasis in the medial left upper lobe. No appreciable airspace consolidation. No evidence of pleural effusion or pneumothorax. No acute bony abnormality identified. IMPRESSION: No appreciable airspace consolidation. Bronchial wall thickening within the medial left upper lobe, consistent with the patient's history of COPD. Bronchiectasis may also be present at this site. Electronically Signed   By: Kellie Simmering D.O.   On: 08/29/2021 12:49   MR HEEL LEFT WO CONTRAST  Result Date: 08/30/2021 CLINICAL DATA:  Diabetic foot ulcer of left heel EXAM: MR OF THE LEFT HEEL WITHOUT CONTRAST TECHNIQUE: Multiplanar, multisequence MR imaging of the left hindfoot was performed. No intravenous contrast was administered. COMPARISON:  X-ray 08/29/2021 FINDINGS: Bones/Joint/Cartilage Mild patchy subcortical bone marrow edema within the posterior calcaneus. No focal erosion. Preservation of the fatty T1 bone marrow signal. Remaining osseous structures demonstrate preserved bone marrow signal. No fracture or dislocation. Os trigonum with a small amount of surrounding fluid. No significant joint effusion. Joint spaces are preserved. Ligaments Medial and lateral ankle ligaments are intact. Muscles and Tendons Mild tendinosis and tenosynovitis of the peroneus longus and brevis tendons. Trace tenosynovitis of the tibialis posterior and  flexor digitorum longus tendons. Fatty atrophy of the lower leg and foot musculature with diffuse intramuscular edema, likely reflecting denervation changes. Soft tissues Shallow soft tissue ulceration at the posterior aspect of the heel overlying the calcaneus. Surrounding soft tissue edema. No organized fluid collection. Generalized subcutaneous edema of the lower leg. Trace retrocalcaneal bursal fluid. IMPRESSION: 1. Shallow soft tissue ulceration at the posterior aspect of the heel overlying the calcaneus. Mild patchy subcortical bone marrow edema within the posterior calcaneus. Findings may represent reactive osteitis or early acute osteomyelitis. 2. Mild tendinosis and tenosynovitis of the peroneus longus and brevis tendons. 3. Trace tenosynovitis of the tibialis posterior and flexor digitorum longus tendons. 4. Fatty atrophy of the lower leg and foot musculature with diffuse intramuscular edema, likely reflecting denervation changes. Electronically Signed   By: Davina Poke D.O.   On: 08/30/2021 08:47   US ARTERIAL ABI (SCREENING LOWER EXTREMITY)  Result Date: 08/30/2021 CLINICAL DATA:  Bilateral heel ulcers for 4 weeks Current smoker EXAM: NONINVASIVE PHYSIOLOGIC VASCULAR STUDY OF BILATERAL LOWER EXTREMITIES TECHNIQUE: Evaluation of both lower extremities were performed at rest, including calculation of ankle-brachial indices with single level pressure measurements and doppler recording. COMPARISON:  None available FINDINGS: Right ABI:  Noncompressible Left ABI:  Noncompressible Right Lower Extremity:  Normal arterial waveforms at the ankle. Left Lower Extremity:  Normal arterial waveforms at the ankle. > 1.4 Non diagnostic secondary to incompressible vessel calcifications (medial arterial sclerosis of [place]) IMPRESSION: Nondiagnostic ABI examination of the lower extremities due to noncompressible ankle arteries. Electronically Signed   By: Miachel Roux M.D.   On: 08/30/2021 09:18   DG Foot 2  Views Left  Result Date: 08/29/2021 CLINICAL DATA:  Diabetic ulcer on the heel. EXAM: LEFT FOOT - 2 VIEW COMPARISON:  Left foot x-ray 08/04/2021 FINDINGS: There is soft tissue ulceration superficially overlying the posterior calcaneus. There is no radiopaque foreign body. Peripheral vascular calcifications are present. There is no underlying osseous erosion or periosteal reaction. There is no acute fracture or dislocation. A small posterior calcaneal spur is present. IMPRESSION: 1. Soft tissue ulceration overlying the calcaneus. 2. No acute bony abnormality. Electronically Signed   By: Ronney Asters M.D.   On: 08/29/2021 22:27   DG Foot 2 Views Right  Result Date: 08/29/2021 CLINICAL  DATA:  Heel ulcer for 1 month, no known injury, initial encounter EXAM: RIGHT FOOT - 2 VIEW COMPARISON:  None Available. FINDINGS: No acute fracture or dislocation is noted. No gross soft tissue abnormality is noted. Calcaneal spurring is seen. No erosive changes are noted. IMPRESSION: Degenerative change without acute abnormality Electronically Signed   By: Inez Catalina M.D.   On: 08/29/2021 22:27     Assessment & Plan:  Raymonda Pell is a 56 y.o. female with a superficial dry eschar on bilateral heels, diabetic foot ulcer. She has a history of PVD s/p a stent on the right and 2021 and reports of mild to moderate narrowing of the left side at that time. She has a new left foot heel ulcer that is dry and has no signs of infection at this time but she needs further vascular workup to ensure she does not need additional proximal treatment. DP and PT on the left are palpable but diminished compared to the right.  -Betadine to bilateral heels, dry gauze and wrap with kerlix -Off load heels  -Needs to get set up with Vascular for follow up do not think it necessarily has to be inpatient but more urgent referral as outpatient -Needs to get set up with podiatry for longterm management -No surgical debridement indicated at this  time as the eschar is dry and no signs of fluid on the MRI and clear planes between the ulcer and bone   All questions were answered to the satisfaction of the patient and family.  Discussed with team.   Virl Cagey 08/30/2021, 12:53 PM

## 2021-08-30 NOTE — ED Notes (Signed)
Patient transported to MRI 

## 2021-08-31 DIAGNOSIS — J9611 Chronic respiratory failure with hypoxia: Secondary | ICD-10-CM

## 2021-08-31 DIAGNOSIS — K529 Noninfective gastroenteritis and colitis, unspecified: Secondary | ICD-10-CM | POA: Diagnosis not present

## 2021-08-31 DIAGNOSIS — N184 Chronic kidney disease, stage 4 (severe): Secondary | ICD-10-CM

## 2021-08-31 DIAGNOSIS — I5042 Chronic combined systolic (congestive) and diastolic (congestive) heart failure: Secondary | ICD-10-CM

## 2021-08-31 DIAGNOSIS — R197 Diarrhea, unspecified: Secondary | ICD-10-CM

## 2021-08-31 DIAGNOSIS — N179 Acute kidney failure, unspecified: Secondary | ICD-10-CM | POA: Diagnosis not present

## 2021-08-31 DIAGNOSIS — R112 Nausea with vomiting, unspecified: Secondary | ICD-10-CM

## 2021-08-31 LAB — BASIC METABOLIC PANEL
Anion gap: 11 (ref 5–15)
BUN: 39 mg/dL — ABNORMAL HIGH (ref 6–20)
CO2: 34 mmol/L — ABNORMAL HIGH (ref 22–32)
Calcium: 7.6 mg/dL — ABNORMAL LOW (ref 8.9–10.3)
Chloride: 93 mmol/L — ABNORMAL LOW (ref 98–111)
Creatinine, Ser: 2.79 mg/dL — ABNORMAL HIGH (ref 0.44–1.00)
GFR, Estimated: 20 mL/min — ABNORMAL LOW (ref >60)
Glucose, Bld: 103 mg/dL — ABNORMAL HIGH (ref 70–99)
Potassium: 3.4 mmol/L — ABNORMAL LOW (ref 3.5–5.1)
Sodium: 138 mmol/L (ref 135–145)

## 2021-08-31 LAB — GLUCOSE, CAPILLARY
Glucose-Capillary: 109 mg/dL — ABNORMAL HIGH (ref 70–99)
Glucose-Capillary: 113 mg/dL — ABNORMAL HIGH (ref 70–99)
Glucose-Capillary: 118 mg/dL — ABNORMAL HIGH (ref 70–99)
Glucose-Capillary: 127 mg/dL — ABNORMAL HIGH (ref 70–99)
Glucose-Capillary: 140 mg/dL — ABNORMAL HIGH (ref 70–99)
Glucose-Capillary: 148 mg/dL — ABNORMAL HIGH (ref 70–99)

## 2021-08-31 LAB — C DIFFICILE QUICK SCREEN W PCR REFLEX
C Diff antigen: POSITIVE — AB
C Diff toxin: NEGATIVE

## 2021-08-31 LAB — CBC
HCT: 23.3 % — ABNORMAL LOW (ref 36.0–46.0)
Hemoglobin: 7.2 g/dL — ABNORMAL LOW (ref 12.0–15.0)
MCH: 25.7 pg — ABNORMAL LOW (ref 26.0–34.0)
MCHC: 30.9 g/dL (ref 30.0–36.0)
MCV: 83.2 fL (ref 80.0–100.0)
Platelets: 254 10*3/uL (ref 150–400)
RBC: 2.8 MIL/uL — ABNORMAL LOW (ref 3.87–5.11)
RDW: 16.6 % — ABNORMAL HIGH (ref 11.5–15.5)
WBC: 15.4 10*3/uL — ABNORMAL HIGH (ref 4.0–10.5)
nRBC: 0 % (ref 0.0–0.2)

## 2021-08-31 MED ORDER — LIDOCAINE 5 % EX PTCH
1.0000 | MEDICATED_PATCH | Freq: Every day | CUTANEOUS | Status: DC
  Administered 2021-08-31 – 2021-09-01 (×2): 1 via TRANSDERMAL
  Filled 2021-08-31 (×2): qty 1

## 2021-08-31 MED ORDER — IPRATROPIUM-ALBUTEROL 0.5-2.5 (3) MG/3ML IN SOLN
3.0000 mL | Freq: Three times a day (TID) | RESPIRATORY_TRACT | Status: DC
  Administered 2021-08-31: 3 mL via RESPIRATORY_TRACT
  Filled 2021-08-31: qty 3

## 2021-08-31 MED ORDER — POTASSIUM CHLORIDE CRYS ER 20 MEQ PO TBCR
20.0000 meq | EXTENDED_RELEASE_TABLET | Freq: Once | ORAL | Status: AC
  Administered 2021-08-31: 20 meq via ORAL
  Filled 2021-08-31: qty 1

## 2021-08-31 MED ORDER — IPRATROPIUM-ALBUTEROL 0.5-2.5 (3) MG/3ML IN SOLN
3.0000 mL | RESPIRATORY_TRACT | Status: DC | PRN
Start: 2021-08-31 — End: 2021-09-01

## 2021-08-31 MED ORDER — PANTOPRAZOLE SODIUM 40 MG PO TBEC
40.0000 mg | DELAYED_RELEASE_TABLET | Freq: Two times a day (BID) | ORAL | Status: DC
  Administered 2021-08-31 – 2021-09-01 (×3): 40 mg via ORAL
  Filled 2021-08-31 (×3): qty 1

## 2021-08-31 MED ORDER — ZOLPIDEM TARTRATE 5 MG PO TABS
5.0000 mg | ORAL_TABLET | Freq: Every day | ORAL | Status: DC
  Administered 2021-08-31 (×2): 5 mg via ORAL
  Filled 2021-08-31 (×2): qty 1

## 2021-08-31 MED ORDER — DICYCLOMINE HCL 10 MG PO CAPS
10.0000 mg | ORAL_CAPSULE | Freq: Three times a day (TID) | ORAL | Status: DC
  Administered 2021-08-31 – 2021-09-01 (×2): 10 mg via ORAL
  Filled 2021-08-31 (×3): qty 1

## 2021-08-31 MED ORDER — LOPERAMIDE HCL 2 MG PO CAPS: 2.0000 mg | ORAL_CAPSULE | ORAL | Status: DC

## 2021-08-31 MED ORDER — BUDESONIDE 0.5 MG/2ML IN SUSP
0.5000 mg | Freq: Two times a day (BID) | RESPIRATORY_TRACT | Status: DC
  Administered 2021-08-31: 0.5 mg via RESPIRATORY_TRACT
  Filled 2021-08-31: qty 2

## 2021-08-31 MED ORDER — BUDESONIDE 0.5 MG/2ML IN SUSP: 0.5000 mg | Freq: Two times a day (BID) | RESPIRATORY_TRACT | Status: DC | PRN

## 2021-08-31 MED ORDER — ONDANSETRON HCL 4 MG/2ML IJ SOLN
4.0000 mg | Freq: Four times a day (QID) | INTRAMUSCULAR | Status: DC
  Administered 2021-08-31 – 2021-09-01 (×5): 4 mg via INTRAVENOUS
  Filled 2021-08-31 (×5): qty 2

## 2021-08-31 MED ORDER — LOPERAMIDE HCL 2 MG PO CAPS
2.0000 mg | ORAL_CAPSULE | Freq: Three times a day (TID) | ORAL | Status: DC
  Administered 2021-09-01 (×2): 2 mg via ORAL
  Filled 2021-08-31 (×3): qty 1

## 2021-08-31 NOTE — Assessment & Plan Note (Addendum)
07/12/2021 echo EF 40 to 45%, grade 2 DD, mild to moderate MR, mild TR Holding furosemide temporarily due to dehydration Restart furosemide after d/c

## 2021-08-31 NOTE — Assessment & Plan Note (Signed)
08/05/2021 hemoglobin A1c 8.0 NovoLog sliding scale Holding amaryl>>restart after d/c

## 2021-08-31 NOTE — Consult Note (Addendum)
Gastroenterology Consult   Referring Provider: No ref. provider found Primary Care Physician:  Neale Burly, MD Primary Gastroenterologist:  Dr. Laural Golden (previously unassigned)  Patient ID: Tracy Walsh; [id number]; 1966/02/06   Admit date: 08/29/2021  LOS: 2 days   Date of Consultation: 08/31/2021  Reason for Consultation: chronic diarrhea  History of Present Illness   Tracy Walsh is a 56 y.o. year old female with history of systolic CHF, Chronic inflammatory demyelinating polyneuropathy (CIDP), CKD stage IV, COPD, type 2 diabetes, CAD with LAD stent October 2022, tobacco use who presented to the ED 5/17 with complaints of vomiting, chest pain, and chronic diarrhea. Patient also reportedly has a history of C diff infection and pancreatitis.  Per review of Care Everywhere there is no evidence of colonoscopy in the past. She noted in February 2022 that prior to having C diff she was following with GI and had an EGD and colonoscopy and noted a removal of a precancerous polyp and told she had "ulcers" and was taking Dexilant daily. She had reported a 7 month history of C-diff.   She seen her PCP Tracy Ramming PA  Aug 22, 2020 with significant diarrhea which she reported to be the same as when she had C-diff in the past and had recently been treated with Fidaxomicin. Patient had reported being treated with Flagyl and IV vancomycin prior to that without relief. She was prescribed fidaxomicin and phenergan and received IV fluids and potassium outpatient. Unfortunately insurance restrictions required her to be treated with oral vancomycin. She was seen for follow up again Aug 31, 2020 at which time she reported ongoing abdominal pain with nausea and vomiting as well as the diarrhea, stool cultures came back negative for C. difficile, she finished the oral vancomycin.  Stools had reduced from 11 a day to 3-4 times a day.  She reported a history of peptic ulcer disease with difficulty obtaining  her PPI and she had not followed with GI in a year, she reported history of chronic pancreatitis however lipase level was 50.  She was started on Carafate and Nexium.  Per review of labs in care everywhere, patient's baseline hemoglobin has been in the 7-8 range for at least the last year all with normocytic indices, was noted to be up to low 9 in December 2021.   ED course: Labs -Hgb 8.8, MCV 82, creatinine 3.47, potassium 3.1, albumin 3.1, GFR 15, lipase 27. CXR suggestive of COPD CT A/P with no abdominal abnormalities, trace bilateral pleural effusions, old granulomatous disease in the spleen  She is being evaluated by general surgery and wound team due to diabetic foot ulcer.   Consult: Diarrhea with 5-6 times a day, sometimes 3-4. Abdominal pain lower abdomen. Lots of gassiness. Sometimes pain improves with defecation. Sometime it is dark green/black. Admits to intermittent pepto and kaopectate use. Weakness associated with diarrhea. Does take iron BID at home. Denies recent fevers. No recent antibiotics. Has been taking 2 tablets 2-3 times a day, maybe helping a little. Not taking the colestipol - did not like swallowing it. It worked the first week but not really after that. Was taking it BID. Always having large amount, watery, occasional clumps of formed stool.   Nausea and vomiting, tried to eat this morning and feels like something is stuck in her chest and hurts when it hits that spot and it burns (going on for about the last week). Does admit to reflux - taking omeprazole 40 once a day - having  breakthrough symptoms 2-3 times a week. Has tried prevacid, nexium, zantac, and dexilant). Denies dysphagia.   Does not follow currently with GI, used to follow with [facility]. Has had an EGD and 2 colonoscopies - admitted to finding polyps (a little over a year ago). Is not aware of samples taken to rule out microscopic colitis. States that her GI doctors previously did  diagnose her with IBS- D but did not try treating her with anything.   She reports no chest pain and her shortness of breath is slightly improved.   Cholecystectomy about 4-5 years ago. Appendectomy 2-3 years ago. Had diarrhea after the colonoscopy. Had cdiff in the past and treated multiple times.    Past Medical History:  Diagnosis Date   CHF (congestive heart failure) (HCC)    CIDP (chronic inflammatory demyelinating polyneuropathy) (HCC)    CKD (chronic kidney disease)    COPD (chronic obstructive pulmonary disease) (HCC)    Diabetes mellitus without complication (HCC)    Diabetic foot ulcers (HCC)    GERD (gastroesophageal reflux disease)    Migraines    Neuropathy     Past Surgical History:  Procedure Laterality Date   ABDOMINAL HYSTERECTOMY     CHOLECYSTECTOMY     CORONARY ANGIOPLASTY WITH STENT PLACEMENT     SHOULDER SURGERY Bilateral    SPINAL FUSION      Prior to Admission medications   Medication Sig Start Date End Date Taking? Authorizing Provider  albuterol (PROVENTIL) (2.5 MG/3ML) 0.083% nebulizer solution Take 2.5 mg by nebulization every 6 (six) hours as needed. 05/08/21  Yes [provider]  albuterol (VENTOLIN HFA) 108 (90 Base) MCG/ACT inhaler Inhale into the lungs. 02/17/20  Yes [provider]  amitriptyline (ELAVIL) 10 MG tablet  08/03/21  Yes [provider]  ARIPiprazole (ABILIFY) 5 MG tablet Take by mouth. 12/14/19  Yes [provider]  aspirin EC 81 MG EC tablet Take 1 tablet by mouth daily. 09/20/20 09/08/21 Yes [provider]  atorvastatin (LIPITOR) 40 MG tablet Take by mouth. 08/03/21 09/07/21 Yes [provider]  busPIRone (BUSPAR) 15 MG tablet Take 15 mg by mouth 2 (two) times daily. 09/18/20  Yes [provider]  Cholecalciferol 1.25 MG (50000 UT) capsule Take 50,000 Units by mouth once a week. 05/01/21  Yes [provider]  clopidogrel (PLAVIX) 75 MG tablet Take 75 mg by mouth daily.  07/01/18 09/07/21 Yes [provider]  colestipol (COLESTID) 1 g tablet Take 1 g by mouth 3 (three) times daily. 06/12/21  Yes [provider]  doxazosin (CARDURA) 4 MG tablet Take 4 mg by mouth daily. 08/20/21  Yes [provider]  DULoxetine (CYMBALTA) 60 MG capsule Take by mouth. 09/18/20  Yes [provider]  escitalopram (LEXAPRO) 20 MG tablet Take by mouth.   Yes [provider]  ferrous sulfate 325 (65 FE) MG EC tablet Take by mouth. 02/19/21  Yes [provider]  furosemide (LASIX) 80 MG tablet Take 80 mg by mouth daily. 08/09/21 08/22/22 Yes [provider]  gabapentin (NEURONTIN) 100 MG capsule Take 200 mg by mouth 2 (two) times daily. 08/21/21  Yes [provider]  glimepiride (AMARYL) 2 MG tablet Take 2 mg by mouth daily with breakfast. 08/03/21 09/07/21 Yes [provider]  hydrALAZINE (APRESOLINE) 50 MG tablet Take 50 mg by mouth daily. 08/08/21 09/07/21 Yes [provider]  injection device for insulin DEVI by Does not apply route. 10/27/20  Yes [provider]  isosorbide mononitrate (IMDUR) 30 MG 24 hr tablet Take 30 mg by mouth daily. 05/04/21  Yes [provider]  metoprolol succinate (TOPROL-XL) 50 MG 24 hr tablet Take 50 mg by mouth daily. 08/09/21 09/08/21 Yes [provider]  sodium bicarbonate 325 MG tablet Take by mouth.   Yes [provider]  vitamin B-12 (CYANOCOBALAMIN) 1000 MCG tablet Take 1,000 mcg by mouth daily. 08/03/21  Yes [provider]  zolpidem (AMBIEN) 5 MG tablet Take 5 mg by mouth at bedtime. 08/20/21  Yes [provider]    Current Facility-Administered Medications  Medication Dose Route Frequency Provider Last Rate Last Admin   acetaminophen (TYLENOL) tablet 650 mg  650 mg Oral Q6H PRN Emokpae, Ejiroghene E, MD   650 mg at 08/30/21 1224   Or   acetaminophen (TYLENOL) suppository 650 mg  650 mg Rectal Q6H PRN Emokpae, Ejiroghene E, MD        aspirin EC tablet 81 mg  81 mg Oral Daily Emokpae, Ejiroghene E, MD   81 mg at 08/30/21 0917   atorvastatin (LIPITOR) tablet 40 mg  40 mg Oral Daily Emokpae, Ejiroghene E, MD   40 mg at 08/30/21 0917   budesonide (PULMICORT) nebulizer solution 0.5 mg  0.5 mg Nebulization BID Tat, David, MD       clopidogrel (PLAVIX) tablet 75 mg  75 mg Oral Daily Emokpae, Ejiroghene E, MD   75 mg at 08/30/21 0917   gabapentin (NEURONTIN) capsule 200 mg  200 mg Oral BID Domenic Polite, MD   200 mg at 08/30/21 2213   heparin injection 5,000 Units  5,000 Units Subcutaneous Q8H Emokpae, Ejiroghene E, MD   5,000 Units at 08/31/21 0535   insulin aspart (novoLOG) injection 0-9 Units  0-9 Units Subcutaneous Q4H Emokpae, Ejiroghene E, MD   2 Units at 08/30/21 2049   ipratropium-albuterol (DUONEB) 0.5-2.5 (3) MG/3ML nebulizer solution 3 mL  3 mL Nebulization Q8H Tat, Shanon Brow, MD       isosorbide mononitrate (IMDUR) 24 hr tablet 30 mg  30 mg Oral Daily Emokpae, Ejiroghene E, MD   30 mg at 08/30/21 0917   metoprolol succinate (TOPROL-XL) 24 hr tablet 50 mg  50 mg Oral Daily Emokpae, Ejiroghene E, MD   50 mg at 08/30/21 0917   pantoprazole (PROTONIX) EC tablet 40 mg  40 mg Oral BID Tat, David, MD       potassium chloride SA (KLOR-CON M) CR tablet 20 mEq  20 mEq Oral Once Tat, David, MD       povidone-Iodine (BETADINE) 5 % topical solution   Topical Q1400 Virl Cagey, MD   Given by Other at 08/30/21 1755   promethazine (PHENERGAN) tablet 12.5 mg  12.5 mg Oral Q6H PRN Emokpae, Ejiroghene E, MD   12.5 mg at 08/31/21 0323   zolpidem (AMBIEN) tablet 5 mg  5 mg Oral QHS Adefeso, Oladapo, DO   5 mg at 08/31/21 0037    Allergies as of 08/29/2021 - Review Complete 08/29/2021  Allergen Reaction Noted   Amoxicillin Anaphylaxis 08/29/2021   Penicillins Anaphylaxis 08/29/2021   Sulfa antibiotics Anaphylaxis 08/29/2021    No family history on file.  Social History   Socioeconomic History   Marital status: Married     Spouse name: Not on file   Number of children: Not on file   Years of education: Not on file   Highest education level: Not on file  Occupational History   Not on file  Tobacco Use   Smoking status: Every Day    Packs/day: 0.50    Types: Cigarettes   Smokeless tobacco: Never  Vaping Use   Vaping Use: Never used  Substance and Sexual Activity   Alcohol use: Never   Drug use: Never   Sexual activity: Not on file  Other Topics Concern   Not on file  Social History Narrative   Not on file   Social Determinants of Health   Financial Resource Strain: Not on file  Food Insecurity: Not on file  Transportation Needs: Not on file  Physical Activity: Not on file  Stress: Not on file  Social Connections: Not on file  Intimate Partner Violence: Not on file     Review of Systems   Gen: Denies any fever, chills, loss of appetite, change in weight or weight loss CV: Denies chest pain, heart palpitations, syncope, edema  Resp: + shortness of breath with rest. Denies cough, wheezing, coughing up blood, and pleurisy. GI: see HPI GU : Denies urinary burning, blood in urine, urinary frequency, and urinary incontinence. MS: Denies joint pain, limitation of movement, swelling, cramps, and atrophy.  Derm: Denies rash, itching, dry skin, hives. Psych: Denies depression, anxiety, memory loss, hallucinations, and confusion. Heme: Denies bruising or bleeding Neuro:  Denies any headaches, dizziness, paresthesias, shaking  Physical Exam   Vital Signs in last 24 hours: Temp:  [98.6 F (37 C)] 98.6 F (37 C) (05/18 1559) Pulse Rate:  [81-96] 81 (05/19 0453) Resp:  [11-29] 18 (05/18 1559) BP: (133-168)/(74-92) 157/82 (05/19 0453) SpO2:  [92 %-100 %] 100 % (05/19 0453) Weight:  [81.3 kg] 81.3 kg (05/18 1559) Last BM Date : 08/30/21  General:   Alert,  Well-developed, well-nourished. Head:  Normocephalic and atraumatic. Eyes:  Sclera clear, no icterus.   Conjunctiva pink. Ears:  Normal  auditory acuity. Lungs:  Clear throughout to auscultation R > L Heart:  Regular rate and rhythm; no murmurs, clicks, rubs,  or gallops. Abdomen:  Soft, nontender and nondistended. No masses, hepatosplenomegaly or hernias noted. Normal bowel sounds, without guarding, and without rebound.   Rectal:   deferred Msk:  Symmetrical without gross deformities. Normal posture. Extremities:  + 2 pitting edema to BLE Neurologic:  Alert and  oriented x4. Psych:  Alert and cooperative. Normal mood and affect.  Intake/Output from previous day: 05/18 0701 - 05/19 0700 In: 1096.6 [P.O.:480; I.V.:616.6] Out: -  Intake/Output this shift: No intake/output data recorded.   Labs/Studies   Recent Labs Recent Labs    08/29/21 1226 08/30/21 0346 08/31/21 0431  WBC 23.6* 23.4* 15.4*  HGB 8.8* 7.1* 7.2*  HCT 28.1* 23.9* 23.3*  PLT 347 269 254   BMET Recent Labs    08/29/21 1226 08/30/21 0346 08/31/21 0431  NA 143 139 138  K 3.1* 2.9* 3.4*  CL 83* 91* 93*  CO2 44* 37* 34*  GLUCOSE 134* 91 103*  BUN 45* 42* 39*  CREATININE 3.47* 3.28* 2.79*  CALCIUM 8.6* 7.5* 7.6*   LFT Recent Labs    08/29/21 1226  PROT 8.0  ALBUMIN 3.1*  AST 16  ALT 16  ALKPHOS 107  BILITOT 0.5   PT/INR No results for input(s): LABPROT, INR in the last 72 hours. Hepatitis Panel No results for input(s): HEPBSAG, HCVAB, HEPAIGM, HEPBIGM in the last 72 hours. C-Diff No results for input(s): CDIFFTOX in the last 72 hours.  Radiology/Studies CT ABDOMEN PELVIS WO CONTRAST  Result Date: 08/29/2021 CLINICAL DATA:  Abdominal pain, acute, nonlocalized  EXAM: CT ABDOMEN AND PELVIS WITHOUT CONTRAST TECHNIQUE: Multidetector CT imaging of the abdomen and pelvis was performed following the standard protocol without IV contrast. RADIATION DOSE REDUCTION: This exam was performed according to the departmental dose-optimization program which includes automated exposure control, adjustment of the mA and/or kV according to patient  size and/or use of iterative reconstruction technique. COMPARISON:  None Available. FINDINGS: Lower chest: Trace bilateral pleural effusions, right greater than left. Hepatobiliary: No focal liver abnormality is seen. Status post cholecystectomy. No biliary dilatation. Pancreas: No focal abnormality or ductal dilatation. Spleen: Calcifications throughout the spleen compatible with old granulomatous disease. Normal size. Adrenals/Urinary Tract: No adrenal abnormality. No focal renal abnormality. No stones or hydronephrosis. Urinary bladder is unremarkable. Stomach/Bowel: Stomach, large and small bowel grossly unremarkable. Vascular/Lymphatic: No evidence of aneurysm or adenopathy. Reproductive: Prior hysterectomy.  No adnexal masses. Other:  No free fluid or free air. Musculoskeletal: Postoperative changes in the lower lumbar spine. No acute bony abnormality. IMPRESSION: No acute findings in the abdomen or pelvis. Trace bilateral pleural effusions. Old granulomatous disease of the spleen. Electronically Signed   By: Rolm Baptise M.D.   On: 08/29/2021 15:08   DG Chest 2 View  Result Date: 08/29/2021 CLINICAL DATA:  Provided history: Chest pain. Additional history provided: Headache, nausea/vomiting/diarrhea for 2 days. History of CHF, CKD, anemia. EXAM: CHEST - 2 VIEW COMPARISON:  Prior chest radiographs 08/05/2018 FINDINGS: Left chest infusion port catheter with tip projecting at the level of the superior cavoatrial junction. Spinal stimulator leads within the thoracic spinal canal. Heart size within normal limits. Bronchial wall thickening with possible bronchiectasis in the medial left upper lobe. No appreciable airspace consolidation. No evidence of pleural effusion or pneumothorax. No acute bony abnormality identified. IMPRESSION: No appreciable airspace consolidation. Bronchial wall thickening within the medial left upper lobe, consistent with the patient's history of COPD. Bronchiectasis may also be present  at this site. Electronically Signed   By: Kellie Simmering D.O.   On: 08/29/2021 12:49   MR HEEL LEFT WO CONTRAST  Result Date: 08/30/2021 CLINICAL DATA:  Diabetic foot ulcer of left heel EXAM: MR OF THE LEFT HEEL WITHOUT CONTRAST TECHNIQUE: Multiplanar, multisequence MR imaging of the left hindfoot was performed. No intravenous contrast was administered. COMPARISON:  X-ray 08/29/2021 FINDINGS: Bones/Joint/Cartilage Mild patchy subcortical bone marrow edema within the posterior calcaneus. No focal erosion. Preservation of the fatty T1 bone marrow signal. Remaining osseous structures demonstrate preserved bone marrow signal. No fracture or dislocation. Os trigonum with a small amount of surrounding fluid. No significant joint effusion. Joint spaces are preserved. Ligaments Medial and lateral ankle ligaments are intact. Muscles and Tendons Mild tendinosis and tenosynovitis of the peroneus longus and brevis tendons. Trace tenosynovitis of the tibialis posterior and flexor digitorum longus tendons. Fatty atrophy of the lower leg and foot musculature with diffuse intramuscular edema, likely reflecting denervation changes. Soft tissues Shallow soft tissue ulceration at the posterior aspect of the heel overlying the calcaneus. Surrounding soft tissue edema. No organized fluid collection. Generalized subcutaneous edema of the lower leg. Trace retrocalcaneal bursal fluid. IMPRESSION: 1. Shallow soft tissue ulceration at the posterior aspect of the heel overlying the calcaneus. Mild patchy subcortical bone marrow edema within the posterior calcaneus. Findings may represent reactive osteitis or early acute osteomyelitis. 2. Mild tendinosis and tenosynovitis of the peroneus longus and brevis tendons. 3. Trace tenosynovitis of the tibialis posterior and flexor digitorum longus tendons. 4. Fatty atrophy of the lower leg and foot musculature with diffuse intramuscular edema,  likely reflecting denervation changes. Electronically  Signed   By: Davina Poke D.O.   On: 08/30/2021 08:47   US ARTERIAL ABI (SCREENING LOWER EXTREMITY)  Result Date: 08/30/2021 CLINICAL DATA:  Bilateral heel ulcers for 4 weeks Current smoker EXAM: NONINVASIVE PHYSIOLOGIC VASCULAR STUDY OF BILATERAL LOWER EXTREMITIES TECHNIQUE: Evaluation of both lower extremities were performed at rest, including calculation of ankle-brachial indices with single level pressure measurements and doppler recording. COMPARISON:  None available FINDINGS: Right ABI:  Noncompressible Left ABI:  Noncompressible Right Lower Extremity:  Normal arterial waveforms at the ankle. Left Lower Extremity:  Normal arterial waveforms at the ankle. > 1.4 Non diagnostic secondary to incompressible vessel calcifications (medial arterial sclerosis of [place]) IMPRESSION: Nondiagnostic ABI examination of the lower extremities due to noncompressible ankle arteries. Electronically Signed   By: Miachel Roux M.D.   On: 08/30/2021 09:18   DG Foot 2 Views Left  Result Date: 08/29/2021 CLINICAL DATA:  Diabetic ulcer on the heel. EXAM: LEFT FOOT - 2 VIEW COMPARISON:  Left foot x-ray 08/04/2021 FINDINGS: There is soft tissue ulceration superficially overlying the posterior calcaneus. There is no radiopaque foreign body. Peripheral vascular calcifications are present. There is no underlying osseous erosion or periosteal reaction. There is no acute fracture or dislocation. A small posterior calcaneal spur is present. IMPRESSION: 1. Soft tissue ulceration overlying the calcaneus. 2. No acute bony abnormality. Electronically Signed   By: Ronney Asters M.D.   On: 08/29/2021 22:27   DG Foot 2 Views Right  Result Date: 08/29/2021 CLINICAL DATA:  Heel ulcer for 1 month, no known injury, initial encounter EXAM: RIGHT FOOT - 2 VIEW COMPARISON:  None Available. FINDINGS: No acute fracture or dislocation is noted. No gross soft tissue abnormality is noted. Calcaneal spurring is seen. No erosive changes are  noted. IMPRESSION: Degenerative change without acute abnormality Electronically Signed   By: Inez Catalina M.D.   On: 08/29/2021 22:27     Assessment   Nelani Schmelzle is a 56 y.o. year old female with history of systolic CHF, Chronic inflammatory demyelinating polyneuropathy (CIDP), CKD stage IV, COPD, type 2 diabetes, CAD with LAD stent October 2022, tobacco use presenting with complaints of vomiting, chest pain, and chronic diarrhea. Patient requested GI consultation for chronic diarrhea.   Chronic Diarrhea: Patient has a history of C diff in the past and has been treated with Dificid. Patient is post cholecystectomy estimated to be about 4-5 years ago.  CRP is elevated to 1.5 and Sed rate elevated at 80 however this could be elevated secondary to infectious process or patients foot ulcer.  Last colonoscopy reportedly a little over a year ago, unable to see any records. Patient reports she has had and EGD and 2 colonoscopies only with a couple of polyps. She has reportedly been diagnosed with IBS D by her prior GI team. She has associated abdominal pain and gassiness that improves sometimes with defecation. She has tried imodium, petpo bismol and kaopectate at times. She has tried colestipol BID and noted only improvement for a week and did not continue to take due to taste. Will follow up stool studies and add back imodium 2 mg every 3 hours as needed for diarrhea. Will need outpatient follow up.   Chronic IDA: She reports long history of IDA. She takes oral iron BID at home. Hgb on admission 8.8, baseline appears to be in the 7-9 range. Last iron panel on 08/05/21 with Iron 42, Saturation 17%, ferritin 49.3. Sh reportedly has had and  EGD and 2 colonoscopies with normal findings other than polyps. Continue home iron and B12.   Nausea/vomiting/GERD/Dysphagia: Patient reports a history of GERD. She has been on nexium, zantac, and dexilant in the past. Reports that she was recently switched from her  Dexilant to omeprazole 40 mg once daily by her PCP and has been having breakthrough symptoms about 3-4 times a week. She complains of feeling like food is stuck about the distal sternum with a burning sensation over the last week. Spoke about continuing PPI and possibly pursuing upper endoscopy to further evaluate esophageal dysphagia. No weight loss. She has occasional nausea and vomiting sometimes relieved with zofran and/or phenergan.    Plan / Recommendations   Follow up C diff and GI pathogen panel Imodium 2 mg every 4 hours for 2 days then take as needed Continue protonix 40 mg BID, recommend on discharge as well.  Continue home iron and B12 Antiemetics per hospitalist Could consider course of Xifaxin if stool studies negative Follow up outpatient for dysphagia evaluation as well as chronic diarrhea     08/31/2021, 9:11 AM  Venetia Night, MSN, FNP-BC, AGACNP-BC [facility]

## 2021-08-31 NOTE — Assessment & Plan Note (Signed)
Chronically on 2L at night stable

## 2021-08-31 NOTE — Progress Notes (Addendum)
PROGRESS NOTE  Forrest Demuro TRZ:[id number] DOB: May 04, 1965 DOA: 08/29/2021 PCP: Neale Burly, MD  Brief History:  56 year old female with a history of systolic CHF, CIDP, CKD stage IV, COPD, diabetes mellitus type 2, coronary artery disease with LAD stent 01/15/2021, and tobacco abuse presenting with complaints of vomiting chest pain and chronic diarrhea.  Vomiting started 2 to 3 days ago after she ate at [facility], reported numerous episodes, 15-20 of vomiting, denies any hematemesis. -has chronic diarrhea that is unchanged.  -In the ED she was noted to have potassium of 3.1, creatinine of 3.4, chest x-ray suggestive of COPD, CT abdomen pelvis noted trace bilateral effusions  Notably, the patient had a recent hospital admission to [facility] from 08/04/2021 to 08/08/2021 for acute on chronic systolic CHF.  She was discharged with furosemide 80 mg daily.  Her lisinopril and spironolactone were discontinued because of worsening renal function. Since admission to the hospital, the patient was treated conservatively with some clinical improvement.  She continues to have nausea and vomiting although this is improving.  She continues to have diarrhea.  She was started on IV fluids.  The patient also has bilateral heel ulcers, left greater than right.  MRI of the left heel showed mild patchy subcortical bone marrow edema in the posterior calcaneus consistent with osteitis or early acute osteomyelitis.  There was no abscess.  The patient was seen by general surgery who felt the patient did not need any debridement at this time.  She will need outpatient vascular surgery follow-up.  GI was consulted for the patient's chronic diarrhea.   Assessment and Plan: * Acute renal failure superimposed on stage 4 chronic kidney disease (HCC) -Baseline creatinine 2.1-2.4 -Presented with serum creatinine 3.47 -Secondary to volume depletion -Hold home Lasix 80 mg daily  Diabetic foot ulcer ([place]) -Do not appear  clinically infected on exam -Remain off antibiotics -X-ray of bilateral heels--negative for bony abnormality -08/30/2021 MRI left heel--mild patchy subcortical bone marrow edema at the posterior calcaneus -Request general surgery--no need for debridement at this point -ESR---80 -CRP--1.5 -Patient evaluated by wound care>> follow recommendations -Place Prevalon boots when the patient is in bed to offload pressure -She will need outpatient vascular follow-up>> ABIs nondiagnostic secondary to noncompressible arteries of the ankle   Chronic respiratory failure with hypoxia (HCC) Chronically on 2L at night stable  Chronic combined systolic and diastolic CHF (congestive heart failure) ([place]) 07/12/2021 echo EF 40 to 45%, grade 2 DD, mild to moderate MR, mild TR Holding furosemide temporarily  CAD (coronary artery disease) Reports chest pain initially, appears to be GI related.   Troponins, EKG unremarkable.  No prior EKG to compare.   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  She is on Plavix. -Resume ASA Plavix, Imdur, metoprolol, atorvastatin -no CP since admission  Uncontrolled type 2 diabetes mellitus with hyperglycemia, without long-term current use of insulin (HCC) 08/05/2021 hemoglobin A1c 8.0 NovoLog sliding scale Holding abnormal  Essential hypertension Stable. -Continue med reconciliation resume home medications, Imdur, metoprolol, hydralazine  Hypokalemia  -Likely from GI losses.  Replete.  Chronic diarrhea At baseline has 4-6 bowel movements daily.  She reports colonoscopies, stool studies, negative stool C. difficile within the past year.   -Patient requesting GI evaluation>> Consult requested      Family Communication:   no Family at bedside  Consultants:  general surgery/GI  Code Status:  FULL  DVT Prophylaxis:  [place] Heparin    Procedures: As Listed in Progress Note  Above  Antibiotics: None       Subjective: Patient has had 1 episode of emesis.  She continues to have 4-5 bowel movements daily without hematochezia or melena.  She denies any abdominal pain.  Chest pain is gone.  She has no shortness of breath.  There is no fevers, chills, headache.  Objective: Vitals:   08/30/21 1500 08/30/21 1530 08/30/21 1559 08/31/21 0453  BP: 140/85 133/74 (!) 144/80 (!) 157/82  Pulse: 84 83 83 81  Resp: 15 16 18    Temp:   98.6 F (37 C)   TempSrc:   Oral   SpO2: 95% 97% 100% 100%  Weight:   81.3 kg   Height:   5' 8"  (1.727 m)     Intake/Output Summary (Last 24 hours) at 08/31/2021 3299 Last data filed at 08/30/2021 1900 Gross per 24 hour  Intake 1096.59 ml  Output --  Net 1096.59 ml   Weight change: 0.56 kg Exam:  General:  Pt is alert, follows commands appropriately, not in acute distress HEENT: No icterus, No thrush, No neck mass, /AT Cardiovascular: RRR, S1/S2, no rubs, no gallops Respiratory: Bibasilar rales.  Bibasilar expiratory wheeze. Abdomen: Soft/+BS, non tender, non distended, no guarding Extremities: trace LE  edema, No lymphangitis, No petechiae, No rashes, no synovitis; left heel with eschar.  No drainage.  No erythema.   Data Reviewed: I have personally reviewed following labs and imaging studies Basic Metabolic Panel: Recent Labs  Lab 08/29/21 1226 08/29/21 1412 08/30/21 0346 08/31/21 0431  NA 143  --  139 138  K 3.1*  --  2.9* 3.4*  CL 83*  --  91* 93*  CO2 44*  --  37* 34*  GLUCOSE 134*  --  91 103*  BUN 45*  --  42* 39*  CREATININE 3.47*  --  3.28* 2.79*  CALCIUM 8.6*  --  7.5* 7.6*  MG  --  1.8  --   --    Liver Function Tests: Recent Labs  Lab 08/29/21 1226  AST 16  ALT 16  ALKPHOS 107  BILITOT 0.5  PROT 8.0  ALBUMIN 3.1*   Recent Labs  Lab 08/29/21 1226  LIPASE 27   No results for input(s): AMMONIA in the last 168 hours. Coagulation Profile: No results for input(s): INR, PROTIME in the  last 168 hours. CBC: Recent Labs  Lab 08/29/21 1226 08/30/21 0346 08/31/21 0431  WBC 23.6* 23.4* 15.4*  HGB 8.8* 7.1* 7.2*  HCT 28.1* 23.9* 23.3*  MCV 82.4 82.1 83.2  PLT 347 269 254   Cardiac Enzymes: No results for input(s): CKTOTAL, CKMB, CKMBINDEX, TROPONINI in the last 168 hours. BNP: Invalid input(s): POCBNP CBG: Recent Labs  Lab 08/30/21 1651 08/30/21 2008 08/30/21 2345 08/31/21 0443 08/31/21 0723  GLUCAP 162* 157* 118* 118* 113*   HbA1C: Recent Labs    08/30/21 0346  HGBA1C 7.3*   Urine analysis: No results found for: COLORURINE, APPEARANCEUR, LABSPEC, PHURINE, GLUCOSEU, HGBUR, BILIRUBINUR, KETONESUR, PROTEINUR, UROBILINOGEN, NITRITE, LEUKOCYTESUR Sepsis Labs: @LABRCNTIP (procalcitonin:4,lacticidven:4) )No results found for this or any previous visit (from the past 240 hour(s)).   Scheduled Meds:  aspirin EC  81 mg Oral Daily   atorvastatin  40 mg Oral Daily   budesonide (PULMICORT) nebulizer solution  0.5 mg Nebulization BID   clopidogrel  75 mg Oral Daily   gabapentin  200 mg Oral BID   heparin  5,000 Units Subcutaneous  Q8H   insulin aspart  0-9 Units Subcutaneous Q4H   ipratropium-albuterol  3 mL Nebulization Q8H   isosorbide mononitrate  30 mg Oral Daily   metoprolol succinate  50 mg Oral Daily   pantoprazole  40 mg Oral BID   potassium chloride  20 mEq Oral Once   povidone-Iodine   Topical Q1400   zolpidem  5 mg Oral QHS   Continuous Infusions:  Procedures/Studies: CT ABDOMEN PELVIS WO CONTRAST  Result Date: 08/29/2021 CLINICAL DATA:  Abdominal pain, acute, nonlocalized EXAM: CT ABDOMEN AND PELVIS WITHOUT CONTRAST TECHNIQUE: Multidetector CT imaging of the abdomen and pelvis was performed following the standard protocol without IV contrast. RADIATION DOSE REDUCTION: This exam was performed according to the departmental dose-optimization program which includes automated exposure control, adjustment of the mA and/or kV according to patient size  and/or use of iterative reconstruction technique. COMPARISON:  None Available. FINDINGS: Lower chest: Trace bilateral pleural effusions, right greater than left. Hepatobiliary: No focal liver abnormality is seen. Status post cholecystectomy. No biliary dilatation. Pancreas: No focal abnormality or ductal dilatation. Spleen: Calcifications throughout the spleen compatible with old granulomatous disease. Normal size. Adrenals/Urinary Tract: No adrenal abnormality. No focal renal abnormality. No stones or hydronephrosis. Urinary bladder is unremarkable. Stomach/Bowel: Stomach, large and small bowel grossly unremarkable. Vascular/Lymphatic: No evidence of aneurysm or adenopathy. Reproductive: Prior hysterectomy.  No adnexal masses. Other:  No free fluid or free air. Musculoskeletal: Postoperative changes in the lower lumbar spine. No acute bony abnormality. IMPRESSION: No acute findings in the abdomen or pelvis. Trace bilateral pleural effusions. Old granulomatous disease of the spleen. Electronically Signed   By: Rolm Baptise M.D.   On: 08/29/2021 15:08   DG Chest 2 View  Result Date: 08/29/2021 CLINICAL DATA:  Provided history: Chest pain. Additional history provided: Headache, nausea/vomiting/diarrhea for 2 days. History of CHF, CKD, anemia. EXAM: CHEST - 2 VIEW COMPARISON:  Prior chest radiographs 08/05/2018 FINDINGS: Left chest infusion port catheter with tip projecting at the level of the superior cavoatrial junction. Spinal stimulator leads within the thoracic spinal canal. Heart size within normal limits. Bronchial wall thickening with possible bronchiectasis in the medial left upper lobe. No appreciable airspace consolidation. No evidence of pleural effusion or pneumothorax. No acute bony abnormality identified. IMPRESSION: No appreciable airspace consolidation. Bronchial wall thickening within the medial left upper lobe, consistent with the patient's history of COPD. Bronchiectasis may also be present at  this site. Electronically Signed   By: Kellie Simmering D.O.   On: 08/29/2021 12:49   MR HEEL LEFT WO CONTRAST  Result Date: 08/30/2021 CLINICAL DATA:  Diabetic foot ulcer of left heel EXAM: MR OF THE LEFT HEEL WITHOUT CONTRAST TECHNIQUE: Multiplanar, multisequence MR imaging of the left hindfoot was performed. No intravenous contrast was administered. COMPARISON:  X-ray 08/29/2021 FINDINGS: Bones/Joint/Cartilage Mild patchy subcortical bone marrow edema within the posterior calcaneus. No focal erosion. Preservation of the fatty T1 bone marrow signal. Remaining osseous structures demonstrate preserved bone marrow signal. No fracture or dislocation. Os trigonum with a small amount of surrounding fluid. No significant joint effusion. Joint spaces are preserved. Ligaments Medial and lateral ankle ligaments are intact. Muscles and Tendons Mild tendinosis and tenosynovitis of the peroneus longus and brevis tendons. Trace tenosynovitis of the tibialis posterior and flexor digitorum longus tendons. Fatty atrophy of the lower leg and foot musculature with diffuse intramuscular edema, likely reflecting denervation changes. Soft tissues Shallow soft tissue ulceration at the posterior aspect of the heel overlying the calcaneus. Surrounding soft  tissue edema. No organized fluid collection. Generalized subcutaneous edema of the lower leg. Trace retrocalcaneal bursal fluid. IMPRESSION: 1. Shallow soft tissue ulceration at the posterior aspect of the heel overlying the calcaneus. Mild patchy subcortical bone marrow edema within the posterior calcaneus. Findings may represent reactive osteitis or early acute osteomyelitis. 2. Mild tendinosis and tenosynovitis of the peroneus longus and brevis tendons. 3. Trace tenosynovitis of the tibialis posterior and flexor digitorum longus tendons. 4. Fatty atrophy of the lower leg and foot musculature with diffuse intramuscular edema, likely reflecting denervation changes. Electronically  Signed   By: Davina Poke D.O.   On: 08/30/2021 08:47   US ARTERIAL ABI (SCREENING LOWER EXTREMITY)  Result Date: 08/30/2021 CLINICAL DATA:  Bilateral heel ulcers for 4 weeks Current smoker EXAM: NONINVASIVE PHYSIOLOGIC VASCULAR STUDY OF BILATERAL LOWER EXTREMITIES TECHNIQUE: Evaluation of both lower extremities were performed at rest, including calculation of ankle-brachial indices with single level pressure measurements and doppler recording. COMPARISON:  None available FINDINGS: Right ABI:  Noncompressible Left ABI:  Noncompressible Right Lower Extremity:  Normal arterial waveforms at the ankle. Left Lower Extremity:  Normal arterial waveforms at the ankle. > 1.4 Non diagnostic secondary to incompressible vessel calcifications (medial arterial sclerosis of [place]) IMPRESSION: Nondiagnostic ABI examination of the lower extremities due to noncompressible ankle arteries. Electronically Signed   By: Miachel Roux M.D.   On: 08/30/2021 09:18   DG Foot 2 Views Left  Result Date: 08/29/2021 CLINICAL DATA:  Diabetic ulcer on the heel. EXAM: LEFT FOOT - 2 VIEW COMPARISON:  Left foot x-ray 08/04/2021 FINDINGS: There is soft tissue ulceration superficially overlying the posterior calcaneus. There is no radiopaque foreign body. Peripheral vascular calcifications are present. There is no underlying osseous erosion or periosteal reaction. There is no acute fracture or dislocation. A small posterior calcaneal spur is present. IMPRESSION: 1. Soft tissue ulceration overlying the calcaneus. 2. No acute bony abnormality. Electronically Signed   By: Ronney Asters M.D.   On: 08/29/2021 22:27   DG Foot 2 Views Right  Result Date: 08/29/2021 CLINICAL DATA:  Heel ulcer for 1 month, no known injury, initial encounter EXAM: RIGHT FOOT - 2 VIEW COMPARISON:  None Available. FINDINGS: No acute fracture or dislocation is noted. No gross soft tissue abnormality is noted. Calcaneal spurring is seen. No erosive changes are  noted. IMPRESSION: Degenerative change without acute abnormality Electronically Signed   By: Inez Catalina M.D.   On: 08/29/2021 22:27    Orson Eva, DO  [facility]  If 7PM-7AM, please contact night-coverage [email or web address] Password [email or web address] 08/31/2021, 8:08 AM   LOS: 2 days

## 2021-08-31 NOTE — Hospital Course (Addendum)
56 year old female with a history of systolic CHF, CIDP, CKD stage IV, COPD, diabetes mellitus type 2, coronary artery disease with LAD stent 01/15/2021, and tobacco abuse presenting with complaints of vomiting chest pain and chronic diarrhea.  Vomiting started 2 to 3 days ago after she ate at [facility], reported numerous episodes, 15-20 of vomiting, denies any hematemesis. -has chronic diarrhea that is unchanged.  -In the ED she was noted to have potassium of 3.1, creatinine of 3.4, chest x-ray suggestive of COPD, CT abdomen pelvis noted trace bilateral effusions  Notably, the patient had a recent hospital admission to [facility] from 08/04/2021 to 08/08/2021 for acute on chronic systolic CHF.  She was discharged with furosemide 80 mg daily.  Her lisinopril and spironolactone were discontinued because of worsening renal function. Since admission to the hospital, the patient was treated conservatively with some clinical improvement.  She continues to have nausea and vomiting although this is improving.  She continues to have diarrhea.  She was started on IV fluids.  The patient also has bilateral heel ulcers, left greater than right.  MRI of the left heel showed mild patchy subcortical bone marrow edema in the posterior calcaneus consistent with osteitis or early acute osteomyelitis.  There was no abscess.  The patient was seen by general surgery who felt the patient did not need any debridement at this time.  She will need outpatient vascular surgery follow-up.  GI was consulted for the patient's chronic diarrhea.

## 2021-09-01 DIAGNOSIS — R197 Diarrhea, unspecified: Secondary | ICD-10-CM

## 2021-09-01 DIAGNOSIS — I5042 Chronic combined systolic (congestive) and diastolic (congestive) heart failure: Secondary | ICD-10-CM | POA: Diagnosis not present

## 2021-09-01 DIAGNOSIS — D649 Anemia, unspecified: Secondary | ICD-10-CM

## 2021-09-01 DIAGNOSIS — K529 Noninfective gastroenteritis and colitis, unspecified: Secondary | ICD-10-CM

## 2021-09-01 DIAGNOSIS — R112 Nausea with vomiting, unspecified: Secondary | ICD-10-CM

## 2021-09-01 DIAGNOSIS — J9611 Chronic respiratory failure with hypoxia: Secondary | ICD-10-CM | POA: Diagnosis not present

## 2021-09-01 DIAGNOSIS — A0472 Enterocolitis due to Clostridium difficile, not specified as recurrent: Secondary | ICD-10-CM

## 2021-09-01 DIAGNOSIS — N179 Acute kidney failure, unspecified: Secondary | ICD-10-CM | POA: Diagnosis not present

## 2021-09-01 LAB — GASTROINTESTINAL PANEL BY PCR, STOOL (REPLACES STOOL CULTURE)

## 2021-09-01 LAB — BASIC METABOLIC PANEL
Anion gap: 7 (ref 5–15)
BUN: 32 mg/dL — ABNORMAL HIGH (ref 6–20)
CO2: 35 mmol/L — ABNORMAL HIGH (ref 22–32)
Calcium: 7.9 mg/dL — ABNORMAL LOW (ref 8.9–10.3)
Chloride: 95 mmol/L — ABNORMAL LOW (ref 98–111)
Creatinine, Ser: 2.4 mg/dL — ABNORMAL HIGH (ref 0.44–1.00)
GFR, Estimated: 23 mL/min — ABNORMAL LOW (ref >60)
Glucose, Bld: 108 mg/dL — ABNORMAL HIGH (ref 70–99)
Potassium: 4 mmol/L (ref 3.5–5.1)
Sodium: 137 mmol/L (ref 135–145)

## 2021-09-01 LAB — PREPARE RBC (CROSSMATCH)

## 2021-09-01 LAB — CLOSTRIDIUM DIFFICILE BY PCR, REFLEXED: Toxigenic C. Difficile by PCR: POSITIVE — AB

## 2021-09-01 LAB — CBC
HCT: 23.1 % — ABNORMAL LOW (ref 36.0–46.0)
Hemoglobin: 7 g/dL — ABNORMAL LOW (ref 12.0–15.0)
MCH: 25.4 pg — ABNORMAL LOW (ref 26.0–34.0)
MCHC: 30.3 g/dL (ref 30.0–36.0)
MCV: 83.7 fL (ref 80.0–100.0)
Platelets: 226 10*3/uL (ref 150–400)
RBC: 2.76 MIL/uL — ABNORMAL LOW (ref 3.87–5.11)
RDW: 16.6 % — ABNORMAL HIGH (ref 11.5–15.5)
WBC: 13 10*3/uL — ABNORMAL HIGH (ref 4.0–10.5)
nRBC: 0 % (ref 0.0–0.2)

## 2021-09-01 LAB — IRON AND TIBC
Iron: 28 ug/dL (ref 28–170)
Saturation Ratios: 11 % (ref 10.4–31.8)
TIBC: 244 ug/dL — ABNORMAL LOW (ref 250–450)
UIBC: 216 ug/dL

## 2021-09-01 LAB — VITAMIN B12: Vitamin B-12: 1079 pg/mL — ABNORMAL HIGH (ref 180–914)

## 2021-09-01 LAB — ABO/RH: ABO/RH(D): O POS

## 2021-09-01 LAB — GLUCOSE, CAPILLARY
Glucose-Capillary: 105 mg/dL — ABNORMAL HIGH (ref 70–99)
Glucose-Capillary: 110 mg/dL — ABNORMAL HIGH (ref 70–99)
Glucose-Capillary: 129 mg/dL — ABNORMAL HIGH (ref 70–99)

## 2021-09-01 LAB — FERRITIN: Ferritin: 103 ng/mL (ref 11–307)

## 2021-09-01 LAB — FOLATE: Folate: 20.5 ng/mL (ref >5.9)

## 2021-09-01 MED ORDER — PROMETHAZINE HCL 12.5 MG PO TABS: 12.5000 mg | ORAL_TABLET | Freq: Four times a day (QID) | ORAL | 0 refills | Status: DC | PRN

## 2021-09-01 MED ORDER — SODIUM CHLORIDE 0.9% IV SOLUTION: Freq: Once | INTRAVENOUS | Status: AC

## 2021-09-01 MED ORDER — VANCOMYCIN HCL 125 MG PO CAPS
125.0000 mg | ORAL_CAPSULE | Freq: Four times a day (QID) | ORAL | Status: DC
  Filled 2021-09-01 (×9): qty 1

## 2021-09-01 MED ORDER — SODIUM CHLORIDE 0.9 % IV SOLN
250.0000 mg | Freq: Once | INTRAVENOUS | Status: AC
  Administered 2021-09-01: 250 mg via INTRAVENOUS
  Filled 2021-09-01: qty 20

## 2021-09-01 MED ORDER — VANCOMYCIN HCL 125 MG PO CAPS: 125.0000 mg | ORAL_CAPSULE | Freq: Four times a day (QID) | ORAL | 0 refills | Status: DC

## 2021-09-01 NOTE — Assessment & Plan Note (Signed)
Doubt active infection with no toxin production and clinical improvement without treatment --she was toxin neg and PCR positive June 2022 --defer treatment for now

## 2021-09-01 NOTE — Progress Notes (Signed)
Subjective:  Patient remains with diarrhea.  She says she already has had 3 bowel movements.  She denies abdominal pain or rectal bleeding.  She ate some of her breakfast.  She was nauseated this morning.  Current Medications:  Current Facility-Administered Medications:    acetaminophen (TYLENOL) tablet 650 mg, 650 mg, Oral, Q6H PRN, 650 mg at 09/01/21 1007 **OR** acetaminophen (TYLENOL) suppository 650 mg, 650 mg, Rectal, Q6H PRN, Emokpae, Ejiroghene E, MD   aspirin EC tablet 81 mg, 81 mg, Oral, Daily, Emokpae, Ejiroghene E, MD, 81 mg at 09/01/21 0859   atorvastatin (LIPITOR) tablet 40 mg, 40 mg, Oral, Daily, Emokpae, Ejiroghene E, MD, 40 mg at 09/01/21 0858   budesonide (PULMICORT) nebulizer solution 0.5 mg, 0.5 mg, Nebulization, BID PRN, Tat, David, MD   clopidogrel (PLAVIX) tablet 75 mg, 75 mg, Oral, Daily, Emokpae, Ejiroghene E, MD, 75 mg at 09/01/21 0859   dicyclomine (BENTYL) capsule 10 mg, 10 mg, Oral, TID AC, Claretha Townshend U, MD, 10 mg at 09/01/21 0859   gabapentin (NEURONTIN) capsule 200 mg, 200 mg, Oral, BID, Domenic Polite, MD, 200 mg at 09/01/21 0858   heparin injection 5,000 Units, 5,000 Units, Subcutaneous, Q8H, Emokpae, Ejiroghene E, MD, 5,000 Units at 09/01/21 0538   insulin aspart (novoLOG) injection 0-9 Units, 0-9 Units, Subcutaneous, Q4H, Emokpae, Ejiroghene E, MD, 1 Units at 08/31/21 2028   ipratropium-albuterol (DUONEB) 0.5-2.5 (3) MG/3ML nebulizer solution 3 mL, 3 mL, Nebulization, Q4H PRN, Tat, David, MD   isosorbide mononitrate (IMDUR) 24 hr tablet 30 mg, 30 mg, Oral, Daily, Emokpae, Ejiroghene E, MD, 30 mg at 09/01/21 0858   lidocaine (LIDODERM) 5 % 1 patch, 1 patch, Transdermal, Daily, Tat, David, MD, 1 patch at 09/01/21 0901   loperamide (IMODIUM) capsule 2 mg, 2 mg, Oral, TID AC, Zurie Platas U, MD, 2 mg at 09/01/21 0538   metoprolol succinate (TOPROL-XL) 24 hr tablet 50 mg, 50 mg, Oral, Daily, Emokpae, Ejiroghene E, MD, 50 mg at 09/01/21 0858   ondansetron  (ZOFRAN) injection 4 mg, 4 mg, Intravenous, Q6H, Tat, David, MD, 4 mg at 09/01/21 0538   pantoprazole (PROTONIX) EC tablet 40 mg, 40 mg, Oral, BID, Tat, David, MD, 40 mg at 09/01/21 0859   povidone-Iodine (BETADINE) 5 % topical solution, , Topical, Q1400, Virl Cagey, MD, Given at 08/31/21 1826   promethazine (PHENERGAN) tablet 12.5 mg, 12.5 mg, Oral, Q6H PRN, Emokpae, Ejiroghene E, MD, 12.5 mg at 09/01/21 0906   zolpidem (AMBIEN) tablet 5 mg, 5 mg, Oral, QHS, Adefeso, [place], DO, 5 mg at 08/31/21 2028   Objective: Blood pressure (!) 151/79, pulse 79, temperature 98.7 F (37.1 C), temperature source Oral, resp. rate 18, height 5' 8"  (1.727 m), weight 81.3 kg, SpO2 91 %. Patient is alert and in no acute distress.   Labs/studies Results:      Latest Ref Rng & Units 09/01/2021    3:46 AM 08/31/2021    4:31 AM 08/30/2021    3:46 AM  CBC  WBC 4.0 - 10.5 K/uL 13.0   15.4   23.4    Hemoglobin 12.0 - 15.0 g/dL 7.0   7.2   7.1    Hematocrit 36.0 - 46.0 % 23.1   23.3   23.9    Platelets 150 - 400 K/uL 226   254   269         Latest Ref Rng & Units 09/01/2021    3:46 AM 08/31/2021    4:31 AM 08/30/2021    3:46 AM  CMP  Glucose 70 - 99 mg/dL 108   103   91    BUN 6 - 20 mg/dL 32   39   42    Creatinine 0.44 - 1.00 mg/dL 2.40   2.79   3.28    Sodium 135 - 145 mmol/L 137   138   139    Potassium 3.5 - 5.1 mmol/L 4.0   3.4   2.9    Chloride 98 - 111 mmol/L 95   93   91    CO2 22 - 32 mmol/L 35   34   37    Calcium 8.9 - 10.3 mg/dL 7.9   7.6   7.5         Latest Ref Rng & Units 08/29/2021   12:26 PM  Hepatic Function  Total Protein 6.5 - 8.1 g/dL 8.0    Albumin 3.5 - 5.0 g/dL 3.1    AST 15 - 41 U/L 16    ALT 0 - 44 U/L 16    Alk Phosphatase 38 - 126 U/L 107    Total Bilirubin 0.3 - 1.2 mg/dL 0.5     Serum iron 28, TIBC 244 and saturation 11%  Serum ferritin 103  B12 1079 and folate level 20.5  C. difficile antigen positive but toxin negative but PCR  positive.  Assessment:  #1.  Chronic diarrhea.  Patient does not appear to be sick but C. difficile by PCR is positive even though toxin is negative.  Therefore would be reasonable to treat her with p.o. vancomycin for 10 days as discussed with Dr. Shanon Brow Tat.  Given that she is in chronic poor health she may need extended duration of therapy.   #2.  Anemia.  She has acute on chronic anemia.  Iron studies more in line with chronic disease.  She has received 1 unit of PRBCs today.  Recommendations  Vancomycin 125 mg by mouth 4 times a day for 10 days. We will arrange for office visit to determine if she needs longer duration of treatment. Discontinued dicyclomine but continue loperamide. Esophagogastroduodenoscopy and colonoscopy to be scheduled on an outpatient basis after she is completed therapy for C. difficile.

## 2021-09-01 NOTE — Discharge Summary (Addendum)
Physician Discharge Summary   Patient: Tracy Walsh MRN: [id number] DOB: 01/08/1966  Admit date:     08/29/2021  Discharge date: 09/01/21  Discharge Physician: Shanon Brow Coni Homesley   PCP: Neale Burly, MD   Recommendations at discharge:   Please follow up with primary care provider within 1-2 weeks  Please repeat BMP and CBC in one week    Hospital Course: 56 year old female with a history of systolic CHF, CIDP, CKD stage IV, COPD, diabetes mellitus type 2, coronary artery disease with LAD stent 01/15/2021, and tobacco abuse presenting with complaints of vomiting chest pain and chronic diarrhea.  Vomiting started 2 to 3 days ago after she ate at [facility], reported numerous episodes, 15-20 of vomiting, denies any hematemesis. -has chronic diarrhea that is unchanged.  -In the ED she was noted to have potassium of 3.1, creatinine of 3.4, chest x-ray suggestive of COPD, CT abdomen pelvis noted trace bilateral effusions  Notably, the patient had a recent hospital admission to [facility] from 08/04/2021 to 08/08/2021 for acute on chronic systolic CHF.  She was discharged with furosemide 80 mg daily.  Her lisinopril and spironolactone were discontinued because of worsening renal function. Since admission to the hospital, the patient was treated conservatively with some clinical improvement.  She continues to have nausea and vomiting although this is improving.  She continues to have diarrhea.  She was started on IV fluids.  The patient also has bilateral heel ulcers, left greater than right.  MRI of the left heel showed mild patchy subcortical bone marrow edema in the posterior calcaneus consistent with osteitis or early acute osteomyelitis.  There was no abscess.  The patient was seen by general surgery who felt the patient did not need any debridement at this time.  She will need outpatient vascular surgery follow-up.  GI was consulted for the patient's chronic diarrhea.    Assessment and Plan: * Acute renal  failure superimposed on stage 4 chronic kidney disease (HCC) -Baseline creatinine 2.1-2.4 -Presented with serum creatinine 3.47 -Secondary to volume depletion -Hold home Lasix 80 mg daily>>resume after dc -serum creatinine 2.40 at time of d/c Repeat BMP one week after d/c  Diabetic foot ulcer ([place]) -Do not appear clinically infected on exam -Remain off antibiotics -X-ray of bilateral heels--negative for bony abnormality -08/30/2021 MRI left heel--mild patchy subcortical bone marrow edema at the posterior calcaneus -Request general surgery--no need for debridement at this point -ESR---80 -CRP--1.5 -Patient evaluated by wound care>> follow recommendations -Place Prevalon boots when the patient is in bed to offload pressure -She will need outpatient vascular follow-up>> ABIs nondiagnostic secondary to noncompressible arteries of the ankle   C. difficile colitis At time of admission, she presented with diarrhea, abd cramping, and leukocytosis (WBC 23.6) After discussion with GI, Dr. Rehman>>treat>>start vanco po  Acute on chronic anemia Baseline Hgb 8-9 Hgb down to 7.0 In part due to dilution Transfuse one unit PRBC 5/20 Iron saturation 11%, ferritin 103>>give nulecit x 1  Chronic respiratory failure with hypoxia (HCC) Chronically on 2L at night stable  Chronic combined systolic and diastolic CHF (congestive heart failure) ([place]) 07/12/2021 echo EF 40 to 45%, grade 2 DD, mild to moderate MR, mild TR Holding furosemide temporarily due to dehydration Restart furosemide after d/c  CAD (coronary artery disease) Reports chest pain initially, appears to be GI related.   Troponins, EKG unremarkable.  No prior EKG to compare.   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  She is on Plavix. -Resume ASA Plavix, Imdur, metoprolol, atorvastatin -no CP since admission  Uncontrolled type 2 diabetes mellitus with hyperglycemia, without long-term  current use of insulin ([place]) 08/05/2021 hemoglobin A1c 8.0 NovoLog sliding scale Holding amaryl>>restart after d/c  Essential hypertension Stable. -Continue med reconciliation resume home medications, Imdur, metoprolol, hydralazine  Hypokalemia  -Likely from GI losses.  Replete.  Chronic diarrhea At baseline has 4-6 bowel movements daily.   -Patient requesting GI evaluation>> Consult appreciated -continue loperamide per GI -Cdiff positive antigen and PCR -follow up stool pathogen panel         Consultants: GI Procedures performed: none  Disposition: Home Diet recommendation:  Carb modified diet DISCHARGE MEDICATION: Allergies as of 09/01/2021       Reactions   Amoxicillin Anaphylaxis   Penicillins Anaphylaxis   Sulfa Antibiotics Anaphylaxis        Medication List     TAKE these medications    albuterol 108 (90 Base) MCG/ACT inhaler Commonly known as: VENTOLIN HFA Inhale into the lungs.   albuterol (2.5 MG/3ML) 0.083% nebulizer solution Commonly known as: PROVENTIL Take 2.5 mg by nebulization every 6 (six) hours as needed.   amitriptyline 10 MG tablet Commonly known as: ELAVIL   ARIPiprazole 5 MG tablet Commonly known as: ABILIFY Take by mouth.   aspirin EC 81 MG tablet Take 1 tablet by mouth daily.   atorvastatin 40 MG tablet Commonly known as: LIPITOR Take by mouth.   busPIRone 15 MG tablet Commonly known as: BUSPAR Take 15 mg by mouth 2 (two) times daily.   Cholecalciferol 1.25 MG (50000 UT) capsule Take 50,000 Units by mouth once a week.   clopidogrel 75 MG tablet Commonly known as: PLAVIX Take 75 mg by mouth daily.   colestipol 1 g tablet Commonly known as: COLESTID Take 1 g by mouth 3 (three) times daily.   doxazosin 4 MG tablet Commonly known as: CARDURA Take 4 mg by mouth daily.   DULoxetine 60 MG capsule Commonly known as: CYMBALTA Take by mouth.   escitalopram 20 MG tablet Commonly known as: LEXAPRO Take by mouth.    ferrous sulfate 325 (65 FE) MG EC tablet Take by mouth.   furosemide 80 MG tablet Commonly known as: LASIX Take 80 mg by mouth daily.   gabapentin 100 MG capsule Commonly known as: NEURONTIN Take 200 mg by mouth 2 (two) times daily.   glimepiride 2 MG tablet Commonly known as: AMARYL Take 2 mg by mouth daily with breakfast.   hydrALAZINE 50 MG tablet Commonly known as: APRESOLINE Take 50 mg by mouth daily.   injection device for insulin Devi by Does not apply route.   isosorbide mononitrate 30 MG 24 hr tablet Commonly known as: IMDUR Take 30 mg by mouth daily.   metoprolol succinate 50 MG 24 hr tablet Commonly known as: TOPROL-XL Take 50 mg by mouth daily.   promethazine 12.5 MG tablet Commonly known as: PHENERGAN Take 1 tablet (12.5 mg total) by mouth every 6 (six) hours as needed for nausea.   sodium bicarbonate 325 MG tablet Take by mouth.   vancomycin 125 MG capsule Commonly known as: VANCOCIN Take 1 capsule (125 mg total) by mouth 4 (four) times daily.   vitamin B-12 1000 MCG tablet Commonly known as: CYANOCOBALAMIN Take 1,000 mcg by mouth daily.   zolpidem 5 MG tablet Commonly known as: AMBIEN Take 5 mg by mouth at bedtime.        Discharge Exam: Danley Danker Weights   08/29/21  1140 08/30/21 1559  Weight: 80.7 kg 81.3 kg   HEENT:  [place]/AT, No thrush, no icterus CV:  RRR, no rub, no S3, no S4 Lung:  CTA, no wheeze, no rhonchi Abd:  soft/+BS, NT Ext:  No edema, no lymphangitis, no synovitis, no rash   Condition at discharge: stable  The results of significant diagnostics from this hospitalization (including imaging, microbiology, ancillary and laboratory) are listed below for reference.   Imaging Studies: CT ABDOMEN PELVIS WO CONTRAST  Result Date: 08/29/2021 CLINICAL DATA:  Abdominal pain, acute, nonlocalized EXAM: CT ABDOMEN AND PELVIS WITHOUT CONTRAST TECHNIQUE: Multidetector CT imaging of the abdomen and pelvis was performed following the  standard protocol without IV contrast. RADIATION DOSE REDUCTION: This exam was performed according to the departmental dose-optimization program which includes automated exposure control, adjustment of the mA and/or kV according to patient size and/or use of iterative reconstruction technique. COMPARISON:  None Available. FINDINGS: Lower chest: Trace bilateral pleural effusions, right greater than left. Hepatobiliary: No focal liver abnormality is seen. Status post cholecystectomy. No biliary dilatation. Pancreas: No focal abnormality or ductal dilatation. Spleen: Calcifications throughout the spleen compatible with old granulomatous disease. Normal size. Adrenals/Urinary Tract: No adrenal abnormality. No focal renal abnormality. No stones or hydronephrosis. Urinary bladder is unremarkable. Stomach/Bowel: Stomach, large and small bowel grossly unremarkable. Vascular/Lymphatic: No evidence of aneurysm or adenopathy. Reproductive: Prior hysterectomy.  No adnexal masses. Other:  No free fluid or free air. Musculoskeletal: Postoperative changes in the lower lumbar spine. No acute bony abnormality. IMPRESSION: No acute findings in the abdomen or pelvis. Trace bilateral pleural effusions. Old granulomatous disease of the spleen. Electronically Signed   By: Rolm Baptise M.D.   On: 08/29/2021 15:08   DG Chest 2 View  Result Date: 08/29/2021 CLINICAL DATA:  Provided history: Chest pain. Additional history provided: Headache, nausea/vomiting/diarrhea for 2 days. History of CHF, CKD, anemia. EXAM: CHEST - 2 VIEW COMPARISON:  Prior chest radiographs 08/05/2018 FINDINGS: Left chest infusion port catheter with tip projecting at the level of the superior cavoatrial junction. Spinal stimulator leads within the thoracic spinal canal. Heart size within normal limits. Bronchial wall thickening with possible bronchiectasis in the medial left upper lobe. No appreciable airspace consolidation. No evidence of pleural effusion or  pneumothorax. No acute bony abnormality identified. IMPRESSION: No appreciable airspace consolidation. Bronchial wall thickening within the medial left upper lobe, consistent with the patient's history of COPD. Bronchiectasis may also be present at this site. Electronically Signed   By: Kellie Simmering D.O.   On: 08/29/2021 12:49   MR HEEL LEFT WO CONTRAST  Result Date: 08/30/2021 CLINICAL DATA:  Diabetic foot ulcer of left heel EXAM: MR OF THE LEFT HEEL WITHOUT CONTRAST TECHNIQUE: Multiplanar, multisequence MR imaging of the left hindfoot was performed. No intravenous contrast was administered. COMPARISON:  X-ray 08/29/2021 FINDINGS: Bones/Joint/Cartilage Mild patchy subcortical bone marrow edema within the posterior calcaneus. No focal erosion. Preservation of the fatty T1 bone marrow signal. Remaining osseous structures demonstrate preserved bone marrow signal. No fracture or dislocation. Os trigonum with a small amount of surrounding fluid. No significant joint effusion. Joint spaces are preserved. Ligaments Medial and lateral ankle ligaments are intact. Muscles and Tendons Mild tendinosis and tenosynovitis of the peroneus longus and brevis tendons. Trace tenosynovitis of the tibialis posterior and flexor digitorum longus tendons. Fatty atrophy of the lower leg and foot musculature with diffuse intramuscular edema, likely reflecting denervation changes. Soft tissues Shallow soft tissue ulceration at the posterior aspect of the heel overlying  the calcaneus. Surrounding soft tissue edema. No organized fluid collection. Generalized subcutaneous edema of the lower leg. Trace retrocalcaneal bursal fluid. IMPRESSION: 1. Shallow soft tissue ulceration at the posterior aspect of the heel overlying the calcaneus. Mild patchy subcortical bone marrow edema within the posterior calcaneus. Findings may represent reactive osteitis or early acute osteomyelitis. 2. Mild tendinosis and tenosynovitis of the peroneus longus and  brevis tendons. 3. Trace tenosynovitis of the tibialis posterior and flexor digitorum longus tendons. 4. Fatty atrophy of the lower leg and foot musculature with diffuse intramuscular edema, likely reflecting denervation changes. Electronically Signed   By: Davina Poke D.O.   On: 08/30/2021 08:47   US ARTERIAL ABI (SCREENING LOWER EXTREMITY)  Result Date: 08/30/2021 CLINICAL DATA:  Bilateral heel ulcers for 4 weeks Current smoker EXAM: NONINVASIVE PHYSIOLOGIC VASCULAR STUDY OF BILATERAL LOWER EXTREMITIES TECHNIQUE: Evaluation of both lower extremities were performed at rest, including calculation of ankle-brachial indices with single level pressure measurements and doppler recording. COMPARISON:  None available FINDINGS: Right ABI:  Noncompressible Left ABI:  Noncompressible Right Lower Extremity:  Normal arterial waveforms at the ankle. Left Lower Extremity:  Normal arterial waveforms at the ankle. > 1.4 Non diagnostic secondary to incompressible vessel calcifications (medial arterial sclerosis of [place]) IMPRESSION: Nondiagnostic ABI examination of the lower extremities due to noncompressible ankle arteries. Electronically Signed   By: Miachel Roux M.D.   On: 08/30/2021 09:18   DG Foot 2 Views Left  Result Date: 08/29/2021 CLINICAL DATA:  Diabetic ulcer on the heel. EXAM: LEFT FOOT - 2 VIEW COMPARISON:  Left foot x-ray 08/04/2021 FINDINGS: There is soft tissue ulceration superficially overlying the posterior calcaneus. There is no radiopaque foreign body. Peripheral vascular calcifications are present. There is no underlying osseous erosion or periosteal reaction. There is no acute fracture or dislocation. A small posterior calcaneal spur is present. IMPRESSION: 1. Soft tissue ulceration overlying the calcaneus. 2. No acute bony abnormality. Electronically Signed   By: Ronney Asters M.D.   On: 08/29/2021 22:27   DG Foot 2 Views Right  Result Date: 08/29/2021 CLINICAL DATA:  Heel ulcer for 1  month, no known injury, initial encounter EXAM: RIGHT FOOT - 2 VIEW COMPARISON:  None Available. FINDINGS: No acute fracture or dislocation is noted. No gross soft tissue abnormality is noted. Calcaneal spurring is seen. No erosive changes are noted. IMPRESSION: Degenerative change without acute abnormality Electronically Signed   By: Inez Catalina M.D.   On: 08/29/2021 22:27    Microbiology: Results for orders placed or performed during the hospital encounter of 08/29/21  C Difficile Quick Screen w PCR reflex     Status: Abnormal   Collection Time: 08/31/21  7:49 AM   Specimen: STOOL  Result Value Ref Range Status   C Diff antigen POSITIVE (A) NEGATIVE Final   C Diff toxin NEGATIVE NEGATIVE Final   C Diff interpretation Results are indeterminate. See PCR results.  Final    Comment: Performed at [facility], [address], [place], [place] [address]  C. Diff by PCR, Reflexed     Status: Abnormal   Collection Time: 08/31/21  7:49 AM  Result Value Ref Range Status   Toxigenic C. Difficile by PCR POSITIVE (A) NEGATIVE Final    Comment: Positive for toxigenic C. difficile with little to no toxin production. Only treat if clinical presentation suggests symptomatic illness. Performed at [facility], [place] [address] [place], [place] [address]     Labs: CBC: Recent Labs  Lab  08/29/21 1226 08/30/21 0346 08/31/21 0431 09/01/21 0346  WBC 23.6* 23.4* 15.4* 13.0*  HGB 8.8* 7.1* 7.2* 7.0*  HCT 28.1* 23.9* 23.3* 23.1*  MCV 82.4 82.1 83.2 83.7  PLT 347 269 254 974   Basic Metabolic Panel: Recent Labs  Lab 08/29/21 1226 08/29/21 1412 08/30/21 0346 08/31/21 0431 09/01/21 0346  NA 143  --  139 138 137  K 3.1*  --  2.9* 3.4* 4.0  CL 83*  --  91* 93* 95*  CO2 44*  --  37* 34* 35*  GLUCOSE 134*  --  91 103* 108*  BUN 45*  --  42* 39* 32*  CREATININE 3.47*  --  3.28* 2.79* 2.40*  CALCIUM 8.6*  --  7.5* 7.6* 7.9*  MG  --  1.8  --   --   --    Liver Function Tests: Recent Labs   Lab 08/29/21 1226  AST 16  ALT 16  ALKPHOS 107  BILITOT 0.5  PROT 8.0  ALBUMIN 3.1*   CBG: Recent Labs  Lab 08/31/21 2018 08/31/21 2354 09/01/21 0429 09/01/21 0812 09/01/21 1146  GLUCAP 148* 109* 105* 110* 129*    Discharge time spent: greater than 30 minutes.  Signed: Orson Eva, MD [facility] 09/01/2021

## 2021-09-01 NOTE — Assessment & Plan Note (Addendum)
Baseline Hgb 8-9 Hgb down to 7.0 In part due to dilution Transfuse one unit PRBC 5/20 Iron saturation 11%, ferritin 103>>give nulecit x 1

## 2021-09-01 NOTE — Assessment & Plan Note (Signed)
At time of admission, she presented with diarrhea, abd cramping, and leukocytosis (WBC 23.6) After discussion with GI, Dr. Rehman>>treat>>start vanco po

## 2021-09-03 LAB — TYPE AND SCREEN
ABO/RH(D): O POS
Antibody Screen: NEGATIVE
Unit division: 0

## 2021-09-03 LAB — BPAM RBC
Blood Product Expiration Date: 202306142359
ISSUE DATE / TIME: 202305201156
Unit Type and Rh: 5100

## 2021-09-04 ENCOUNTER — Ambulatory Visit: Payer: 59 | Admitting: Orthopedic Surgery

## 2021-09-05 ENCOUNTER — Encounter: Payer: 59 | Admitting: Vascular Surgery

## 2021-09-06 ENCOUNTER — Encounter (HOSPITAL_COMMUNITY): Payer: Self-pay

## 2021-09-06 ENCOUNTER — Ambulatory Visit (HOSPITAL_COMMUNITY): Payer: 59

## 2021-09-11 ENCOUNTER — Ambulatory Visit: Payer: 59 | Admitting: Orthopedic Surgery

## 2021-09-11 DIAGNOSIS — L97421 Non-pressure chronic ulcer of left heel and midfoot limited to breakdown of skin: Secondary | ICD-10-CM

## 2021-09-11 MED ORDER — DOXYCYCLINE HYCLATE 100 MG PO TABS
100.0000 mg | ORAL_TABLET | Freq: Two times a day (BID) | ORAL | 0 refills | Status: DC
Start: 2021-09-11 — End: 2021-11-02

## 2021-09-11 NOTE — Progress Notes (Signed)
Office Visit Note   Patient: Tracy Walsh           Date of Birth: January 24, 1966           MRN: [id number] Visit Date: 09/11/2021              Requested by: Neale Burly, MD [place],  [place] [address] PCP: Neale Burly, MD  Chief Complaint  Patient presents with   Left Foot - Open Wound    Bilateral heel ulcers treated in the ER 08/29/2021   Right Foot - Open Wound      HPI: Patient is a 56 year old woman who is seen for initial evaluation for bilateral heel ulcers.  Patient is status post an MRI scan on May 17 which showed some bone marrow edema.  Patient has had ankle-brachial indices that were noncompressible.  Patient states she is not on dialysis but is stage IV kidney disease.  Most recent hemoglobin A1c 7.3 with an albumin of 3.1.  Patient has been using peroxide alcohol and Betadine to the heel ulcers.  Assessment & Plan: Visit Diagnoses:  1. Non-pressure chronic ulcer of left heel and midfoot limited to breakdown of skin ([place])     Plan: Patient was provided a prescription for doxycycline a PRAFO to be worn on the left at all times start dry dressing changes with Dial soap cleansing.  Recommend that she stop the peroxide alcohol and Betadine.  Follow-Up Instructions: Return in about 1 week (around 09/18/2021).   Ortho Exam  Patient is alert, oriented, no adenopathy, well-dressed, normal affect, normal respiratory effort. Examination patient has a 4 cm decubitus ulcer on the left heel with fibrinous exudative tissue.  This does not probe to bone or tendon.  Patient has palpable pulses bilaterally.  Review of the MRI scan does show some edema over the calcaneus but the ulcer does not extend down to bone.  This may be reactive edema.  Imaging: No results found. No images are attached to the encounter.  Labs: Lab Results  Component Value Date   HGBA1C 7.3 (H) 08/30/2021   ESRSEDRATE 80 (H) 08/30/2021   CRP 1.5 (H) 08/30/2021     Lab Results   Component Value Date   ALBUMIN 3.1 (L) 08/29/2021    Lab Results  Component Value Date   MG 1.8 08/29/2021   No results found for: VD25OH  No results found for: PREALBUMIN    Latest Ref Rng & Units 09/01/2021    3:46 AM 08/31/2021    4:31 AM 08/30/2021    3:46 AM  CBC EXTENDED  WBC 4.0 - 10.5 K/uL 13.0   15.4   23.4    RBC 3.87 - 5.11 MIL/uL 2.76   2.80   2.91    Hemoglobin 12.0 - 15.0 g/dL 7.0   7.2   7.1    HCT 36.0 - 46.0 % 23.1   23.3   23.9    Platelets 150 - 400 K/uL 226   254   269       There is no height or weight on file to calculate BMI.  Orders:  No orders of the defined types were placed in this encounter.  Meds ordered this encounter  Medications   doxycycline (VIBRA-TABS) 100 MG tablet    Sig: Take 1 tablet (100 mg total) by mouth 2 (two) times daily.    Dispense:  60 tablet    Refill:  0     Procedures: No  procedures performed  Clinical Data: No additional findings.  ROS:  All other systems negative, except as noted in the HPI. Review of Systems  Objective: Vital Signs: There were no vitals taken for this visit.  Specialty Comments:  No specialty comments available.  PMFS History: Patient Active Problem List   Diagnosis Date Noted   Acute on chronic anemia 09/01/2021   C. difficile colitis 09/01/2021   Chronic combined systolic and diastolic CHF (congestive heart failure) ([place]) 08/31/2021   Chronic respiratory failure with hypoxia ([place]) 08/31/2021   Acute renal failure superimposed on stage 4 chronic kidney disease ([place]) 08/29/2021   Chronic diarrhea 08/29/2021   Hypokalemia 08/29/2021   Essential hypertension 08/29/2021   Uncontrolled type 2 diabetes mellitus with hyperglycemia, without long-term current use of insulin ([place]) 08/29/2021   Diabetic foot ulcer ([place]) 08/29/2021   CAD (coronary artery disease) 08/29/2021   Past Medical History:  Diagnosis Date   CHF (congestive heart failure) (HCC)    CIDP (chronic inflammatory  demyelinating polyneuropathy) (HCC)    CKD (chronic kidney disease)    COPD (chronic obstructive pulmonary disease) (HCC)    Diabetes mellitus without complication (HCC)    Diabetic foot ulcers (HCC)    GERD (gastroesophageal reflux disease)    Migraines    Neuropathy     History reviewed. No pertinent family history.  Past Surgical History:  Procedure Laterality Date   ABDOMINAL HYSTERECTOMY     CHOLECYSTECTOMY     CORONARY ANGIOPLASTY WITH STENT PLACEMENT     SHOULDER SURGERY Bilateral    SPINAL FUSION     Social History   Occupational History   Not on file  Tobacco Use   Smoking status: Every Day    Packs/day: 0.50    Types: Cigarettes   Smokeless tobacco: Never  Vaping Use   Vaping Use: Never used  Substance and Sexual Activity   Alcohol use: Never   Drug use: Never   Sexual activity: Not on file

## 2021-09-12 ENCOUNTER — Encounter: Payer: Self-pay | Admitting: Orthopedic Surgery

## 2021-09-20 ENCOUNTER — Telehealth: Payer: Self-pay | Admitting: Orthopedic Surgery

## 2021-09-20 ENCOUNTER — Ambulatory Visit: Payer: 59 | Admitting: Orthopedic Surgery

## 2021-09-20 DIAGNOSIS — L97421 Non-pressure chronic ulcer of left heel and midfoot limited to breakdown of skin: Secondary | ICD-10-CM

## 2021-09-20 MED ORDER — HYDROCODONE-ACETAMINOPHEN 5-325 MG PO TABS: 1.0000 | ORAL_TABLET | Freq: Four times a day (QID) | ORAL | 0 refills | Status: DC | PRN

## 2021-09-20 MED ORDER — TRAMADOL HCL 50 MG PO TABS: 50.0000 mg | ORAL_TABLET | Freq: Four times a day (QID) | ORAL | 0 refills | Status: DC | PRN

## 2021-09-20 NOTE — Telephone Encounter (Signed)
Patient called. The pharmacy is out of hydrocodone. They do have oxycodone 64m. Would like that called in for her pain. Her call back number is [phone]

## 2021-09-20 NOTE — Telephone Encounter (Signed)
Pt informed

## 2021-09-25 ENCOUNTER — Telehealth: Payer: Self-pay | Admitting: Orthopedic Surgery

## 2021-09-25 NOTE — Telephone Encounter (Signed)
Patient called asked for a  call back to schedule surgery. The number to contact patient is [phone]

## 2021-09-27 ENCOUNTER — Telehealth: Payer: Self-pay | Admitting: Orthopedic Surgery

## 2021-09-27 ENCOUNTER — Encounter (HOSPITAL_COMMUNITY): Payer: Self-pay | Admitting: Orthopedic Surgery

## 2021-09-27 ENCOUNTER — Other Ambulatory Visit: Payer: Self-pay

## 2021-09-27 ENCOUNTER — Encounter: Payer: Self-pay | Admitting: Orthopedic Surgery

## 2021-09-27 NOTE — Anesthesia Preprocedure Evaluation (Addendum)
Anesthesia Evaluation  Patient identified by MRN, date of birth, ID band Patient awake    Reviewed: Allergy & Precautions, H&P , NPO status   Airway Mallampati: II   Neck ROM: full    Dental   Pulmonary asthma , COPD, Current Smoker and Patient abstained from smoking.,    breath sounds clear to auscultation       Cardiovascular hypertension, + CAD and + Past MI   Rhythm:regular Rate:Normal     Neuro/Psych  Headaches, Anxiety Depression Bipolar Disorder  Neuromuscular disease    GI/Hepatic GERD  ,  Endo/Other  diabetes, Type 2  Renal/GU Renal InsufficiencyRenal disease     Musculoskeletal  (+) Fibromyalgia -  Abdominal   Peds  Hematology   Anesthesia Other Findings   Reproductive/Obstetrics                            Anesthesia Physical Anesthesia Plan  ASA: 3  Anesthesia Plan: General   Post-op Pain Management:    Induction: Intravenous  PONV Risk Score and Plan: 2 and Ondansetron, Dexamethasone, Midazolam and Treatment may vary due to age or medical condition  Airway Management Planned: LMA  Additional Equipment:   Intra-op Plan:   Post-operative Plan: Extubation in OR  Informed Consent: I have reviewed the patients History and Physical, chart, labs and discussed the procedure including the risks, benefits and alternatives for the proposed anesthesia with the patient or authorized representative who has indicated his/her understanding and acceptance.     Dental advisory given  Plan Discussed with: CRNA, Anesthesiologist and Surgeon  Anesthesia Plan Comments: (See PAT note written 09/27/2021 by Myra Gianotti, PA-C. )       Anesthesia Quick Evaluation

## 2021-09-27 NOTE — Progress Notes (Signed)
Office Visit Note   Patient: Tracy Walsh           Date of Birth: Jun 25, 1965           MRN: [id number] Visit Date: 09/20/2021              Requested by: Neale Burly, MD [place],  [place] [address] PCP: Neale Burly, MD  Chief Complaint  Patient presents with   Left Foot - Follow-up      HPI: Patient is a 56 year old woman who presents for evaluation for left heel ulcer she has been on doxycycline using a PRAFO for pressure offloading and Dial soap cleansing and dressing changes.  Patient complains of increasing pain and odor with drainage.  Assessment & Plan: Visit Diagnoses:  1. Non-pressure chronic ulcer of left heel and midfoot limited to breakdown of skin ([place])     Plan: We will plan for surgical debridement of the left heel ulcer possible application of tissue graft.  Anticipate application of portable wound VAC.  Follow-Up Instructions: Return in about 2 weeks (around 10/04/2021).   Ortho Exam  Patient is alert, oriented, no adenopathy, well-dressed, normal affect, normal respiratory effort. Examination patient has a palpable dorsalis pedis and posterior tibial pulse.  She does have a history of renal disease she was on Plavix for cardiac stents that were placed 2 years ago.  Semination of the left heel she has a necrotic ulcer which appears superficial and that is 3 cm in diameter with approximately 50% granulation tissue.  There is no ascending cellulitis no purulent drainage.  Imaging: No results found. No images are attached to the encounter.  Labs: Lab Results  Component Value Date   HGBA1C 7.3 (H) 08/30/2021   ESRSEDRATE 80 (H) 08/30/2021   CRP 1.5 (H) 08/30/2021     Lab Results  Component Value Date   ALBUMIN 3.1 (L) 08/29/2021    Lab Results  Component Value Date   MG 1.8 08/29/2021   No results found for: "VD25OH"  No results found for: "PREALBUMIN"    Latest Ref Rng & Units 09/01/2021    3:46 AM 08/31/2021    4:31 AM  08/30/2021    3:46 AM  CBC EXTENDED  WBC 4.0 - 10.5 K/uL 13.0  15.4  23.4   RBC 3.87 - 5.11 MIL/uL 2.76  2.80  2.91   Hemoglobin 12.0 - 15.0 g/dL 7.0  7.2  7.1   HCT 36.0 - 46.0 % 23.1  23.3  23.9   Platelets 150 - 400 K/uL 226  254  269      There is no height or weight on file to calculate BMI.  Orders:  No orders of the defined types were placed in this encounter.  Meds ordered this encounter  Medications   HYDROcodone-acetaminophen (NORCO/VICODIN) 5-325 MG tablet    Sig: Take 1 tablet by mouth every 6 (six) hours as needed for moderate pain.    Dispense:  20 tablet    Refill:  0   traMADol (ULTRAM) 50 MG tablet    Sig: Take 1 tablet (50 mg total) by mouth every 6 (six) hours as needed for moderate pain.    Dispense:  30 tablet    Refill:  0     Procedures: No procedures performed  Clinical Data: No additional findings.  ROS:  All other systems negative, except as noted in the HPI. Review of Systems  Objective: Vital Signs: There were no vitals taken  for this visit.  Specialty Comments:  No specialty comments available.  PMFS History: Patient Active Problem List   Diagnosis Date Noted   Acute on chronic anemia 09/01/2021   C. difficile colitis 09/01/2021   Chronic combined systolic and diastolic CHF (congestive heart failure) ([place]) 08/31/2021   Chronic respiratory failure with hypoxia ([place]) 08/31/2021   Acute renal failure superimposed on stage 4 chronic kidney disease ([place]) 08/29/2021   Chronic diarrhea 08/29/2021   Hypokalemia 08/29/2021   Essential hypertension 08/29/2021   Uncontrolled type 2 diabetes mellitus with hyperglycemia, without long-term current use of insulin ([place]) 08/29/2021   Diabetic foot ulcer ([place]) 08/29/2021   CAD (coronary artery disease) 08/29/2021   Past Medical History:  Diagnosis Date   CHF (congestive heart failure) (HCC)    CIDP (chronic inflammatory demyelinating polyneuropathy) (HCC)    CKD (chronic kidney disease)     COPD (chronic obstructive pulmonary disease) (HCC)    Diabetes mellitus without complication (HCC)    Diabetic foot ulcers (HCC)    GERD (gastroesophageal reflux disease)    Migraines    Neuropathy     History reviewed. No pertinent family history.  Past Surgical History:  Procedure Laterality Date   ABDOMINAL HYSTERECTOMY     CHOLECYSTECTOMY     CORONARY ANGIOPLASTY WITH STENT PLACEMENT     SHOULDER SURGERY Bilateral    SPINAL FUSION     Social History   Occupational History   Not on file  Tobacco Use   Smoking status: Every Day    Packs/day: 0.50    Types: Cigarettes   Smokeless tobacco: Never  Vaping Use   Vaping Use: Never used  Substance and Sexual Activity   Alcohol use: Never   Drug use: Never   Sexual activity: Not on file

## 2021-09-27 NOTE — Progress Notes (Addendum)
Anesthesia Chart Review: Tracy Walsh  Case: [id number] Date/Time: 09/28/21 1238   Procedure: LEFT HEEL DEBRIDEMENT AND TISSUE GRAFT (Left)   Anesthesia type: Choice   Pre-op diagnosis: Ulcer Left Heel   Location: [facility] OR ROOM 05 / [facility]   Surgeons: Newt Minion, MD       DISCUSSION: Patient is a 56 year old female scheduled for the above procedure. May 2023 MRI suggestive of esteitis or early osteomyelitis of left heal ulcer. She has bene managed on doxycycline, local wound care, and PRAFO but having increasing pain, odor, and drainage at 09/20/21 follow-up visit with Dr. Sharol Given. The above procedure recommended.    History includes smoking, COPD (2L O2 at night), asthma, CAD (MI; DES mLAD & kissing balloon angioplasty at LAD/DIAG bifurcation 01/15/21), chronic diastolic CHF, HLD, DM2 (with neuropathy, nephropathy), CKD (stage IV), chronic inflammatory demyelinating polyneuropathy (CIDP), anemia, childhood hepatitis, fibromyalgia, Bipolar 2 disorder, spinal surgery (spinal fusion; spinal cord stimulator removal 05/05/18), nasal fracture (bilateral nasal fracture closed reduction 11/01/20). 02/09/20 TEE ([facility]) showed an interatrial shunt with injection of agitated saline, consistent with PFO.   - [facility] admission 08/29/21-09/01/21 with N/V/D (+ C. Difficile, WBC 23.6K) with AKI (secondary to ATN/ATI) on CKD and with bilateral heel ulcers. Creatinine 3.47, up from 2.26 on 08/08/21. AKI felt related to volume depletion in setting of V/D.  C. difficile treated with 10 days of vancomycin.  Creatinine down to 2.40 and WBC to 13.0 at discharge. She received IV iron and PRBC for anemia. MRI of the left heal suggested osteitis or early acute osteomyelitis. Continue wound care and follow-up surgery out-patient.  - [facility] Admission 08/04/21-08/08/21 for CHF exacerbation and diabetic left heel ulcer.  A1c 8%.  Amaryl added and managed on sliding scale insulin during hospitalization.  She was transfused PRBC  for hemoglobin 6.5 and started on iron supplement.  Surgery consulted and local wound care for foot wounds.  Cardiology consulted. Acute on chronic HFrEF felt related to poorly controlled HTN. Echo on 07/13/21 showed LVEF 40-45% with diffuse LV hypokinesis (previous EF 55-60%), mild LVH, grade 2 DD, mild-moderate MR. Medications adjusted. No chest pain. Continue DAPT, b-blocker and statin for CAD. Out-patient cardiology follow-up recommended, but [facility] is out of network for patient's insurance.   Her cardiologist was in [place] but now she lived in [place]. She is on ASA and Plavix for DES mLAD 01/15/21. She ran out of Plavix for about a month in April, but was resumed by [facility] cardiology during a hospitalization. She stopped it on her own on 09/11/21, reportedly because she thought she would need surgery. She is > 6 months out for DES, but she was advised to clarify recommendations for this surgery with Dr. Sharol Given. (Dr. Sharol Given did communicate that she would not need to hold DAPT for this procedure.)  Anesthesia team to evaluate on the day of surgery. Updated labs as indicated--given recent AKI and anemia, would favor repeating on the day of surgery.    VS: 09/01/21: BP 160/76, HR 69   PROVIDERS: Hasanaj, Samul Dada, MD is PCP  - Ulice Bold, MD is nephrologist [facility], see Care Everywhere).  Last visit 09/12/21. Holding off on starting her on RAS blockers given still recovering from AKI.He will continue to follow anemia. Started on calcitriol for secondary hyperparathyroidism. One month follow-up planned.   Quarry, MD is cardiologist ([place]). Last evaluation 01/2021. She now lives in [place], [place] and last cardiology evaluation was doing hospitalization at  [facility].    LABS: Most recent lab results include: Lab Results  Component Value Date   WBC 13.0 (H) 09/01/2021   HGB 7.0 (L) 09/01/2021   HCT 23.1 (L) 09/01/2021   PLT 226 09/01/2021   GLUCOSE 108 (H)  09/01/2021   ALT 16 08/29/2021   AST 16 08/29/2021   NA 137 09/01/2021   K 4.0 09/01/2021   CL 95 (L) 09/01/2021   CREATININE 2.40 (H) 09/01/2021   BUN 32 (H) 09/01/2021   CO2 35 (H) 09/01/2021   HGBA1C 7.3 (H) 08/30/2021     IMAGES: MRI Left Heel 08/30/21: IMPRESSION: 1. Shallow soft tissue ulceration at the posterior aspect of the heel overlying the calcaneus. Mild patchy subcortical bone marrow edema within the posterior calcaneus. Findings may represent reactive osteitis or early acute osteomyelitis. 2. Mild tendinosis and tenosynovitis of the peroneus longus and brevis tendons. 3. Trace tenosynovitis of the tibialis posterior and flexor digitorum longus tendons. 4. Fatty atrophy of the lower leg and foot musculature with diffuse intramuscular edema, likely reflecting denervation changes.   CXR 08/29/21: IMPRESSION: - No appreciable airspace consolidation. - Bronchial wall thickening within the medial left upper lobe, consistent with the patient's history of COPD. Bronchiectasis may also be present at this site.    EKG: 08/29/21: Normal sinus rhythm Left anterior fascicular block Left ventricular hypertrophy with QRS widening ( R in aVL , Cornell product , Romhilt-Estes ) Cannot rule out Septal infarct , age undetermined Abnormal ECG No previous ECGs available Confirmed by Octaviano Glow [phone]) on 08/29/2021 3:18:22 PM   CV: Echo 07/13/21: IMPRESSIONS   1. Left ventricular ejection fraction, by estimation, is 40 to 45%. The  left ventricle has mildly decreased function. The left ventricle  demonstrates global hypokinesis. There is mild left ventricular  hypertrophy. Left ventricular diastolic parameters  are consistent with Grade II diastolic dysfunction (pseudonormalization).   2. Right ventricular systolic function is normal. The right ventricular  size is normal. There is moderately elevated pulmonary artery systolic  pressure.   3. Left atrial size was  mild to moderately dilated.   4. Functional MR. The mitral valve is grossly normal. Mild to moderate  mitral valve regurgitation.   5. The aortic valve is grossly normal. Aortic valve regurgitation is not  visualized.   6. The inferior vena cava is dilated in size with <50% respiratory  variability, suggesting right atrial pressure of 15 mmHg.  - Comparison echo 01/19/21 @ [facility]: Normal LV/RV size and systolic function.  Ejection fraction 55 to 60% range.  Mild concentric LVH. 2.  Mild 1+ mitral regurgitation.    Cardiac cath 01/15/21 ([facility]): Findings:  The left main is a large-caliber vessel which bifurcates to the LAD and  the left circumflex.  There is no angiographic evidence of significant  left main disease.   The LAD is a large-caliber vessel which gives off a single large caliber  diagonal branch.  There is 80 to 90% stenosis along the LAD, consistent  with [facility] classification 1, 1, 0.  The ostial diagonal branch was  minimally involved.  This was treated with a Synergy 2.75 x 32 mm DES,  which was postdilated to 3.5 in the proximal segment and 3.0 in the distal  segment with Dudley balloons at high pressures.  Repeat angiography showed 0%  residual stenosis with TIMI-3 flow.   The left circumflex is a large-caliber vessel which gives off 3 obtuse  marginal branches.  The OM1 is a bifurcating vessel  with 50 to 60%  stenosis involving the inferior segment of the OM1 branch.   The RCA is a dominant vessel which provides PDA distally.  There are mild  luminal irregularities, less than 20% along the RCA.   Left heart catheterization:   1.  LVEDP of 14 mmHg  2.  No gradient across aortic valve    TEE 02/08/21 ([facility]): Left Ventricle: Normal left ventricular size.    Left Ventricle: Systolic function is low normal. EF: 50-55%.    Left Atrium: Injection of agitated saline documents an interatrial  shunt under basal conditions. This is consistent with a PFO.    Right  Ventricle: Systolic function is normal.    Aortic Valve: The aortic valve is tricuspid. The leaflets are not  thickened and exhibit normal excursion.    Mitral Valve: Mitral valve structure is normal. The annulus appears to  be mildly dilated.    Mitral Valve: There is moderate regurgitation with a centrally directed  jet. Color flow is << 50% of left atrium. The jet is dense, partial, and  parabolic in contour. The vena contracta is 0.395 cm. The ERO is 13 mm2  and the RV is 24 ml.    IVC/SVC: The pulmonary vein flow shows systolic blunting.    Pericardium: There is no pericardial effusion.   Borderline LV systolic function.  Normal RV function.  Moderate Mitral Insufficiency.   Past Medical History:  Diagnosis Date   CHF (congestive heart failure) (HCC)    CIDP (chronic inflammatory demyelinating polyneuropathy) (HCC)    CKD (chronic kidney disease)    COPD (chronic obstructive pulmonary disease) (HCC)    Diabetes mellitus without complication (HCC)    Diabetic foot ulcers (HCC)    GERD (gastroesophageal reflux disease)    Migraines    Neuropathy     Past Surgical History:  Procedure Laterality Date   ABDOMINAL HYSTERECTOMY     CHOLECYSTECTOMY     CORONARY ANGIOPLASTY WITH STENT PLACEMENT     SHOULDER SURGERY Bilateral    SPINAL FUSION      MEDICATIONS: No current facility-administered medications for this encounter.    albuterol (PROVENTIL) (2.5 MG/3ML) 0.083% nebulizer solution   albuterol (VENTOLIN HFA) 108 (90 Base) MCG/ACT inhaler   amitriptyline (ELAVIL) 10 MG tablet   ARIPiprazole (ABILIFY) 5 MG tablet   atorvastatin (LIPITOR) 40 MG tablet   busPIRone (BUSPAR) 15 MG tablet   Cholecalciferol 1.25 MG (50000 UT) capsule   colestipol (COLESTID) 1 g tablet   doxazosin (CARDURA) 4 MG tablet   doxycycline (VIBRA-TABS) 100 MG tablet   DULoxetine (CYMBALTA) 60 MG capsule   escitalopram (LEXAPRO) 20 MG tablet   ferrous sulfate 325 (65 FE) MG EC tablet    furosemide (LASIX) 80 MG tablet   gabapentin (NEURONTIN) 100 MG capsule   glimepiride (AMARYL) 2 MG tablet   hydrALAZINE (APRESOLINE) 50 MG tablet   HYDROcodone-acetaminophen (NORCO/VICODIN) 5-325 MG tablet   injection device for insulin DEVI   isosorbide mononitrate (IMDUR) 30 MG 24 hr tablet   metoprolol succinate (TOPROL-XL) 50 MG 24 hr tablet   promethazine (PHENERGAN) 12.5 MG tablet   sodium bicarbonate 325 MG tablet   traMADol (ULTRAM) 50 MG tablet   vancomycin (VANCOCIN) 125 MG capsule   vitamin B-12 (CYANOCOBALAMIN) 1000 MCG tablet   zolpidem (AMBIEN) 5 MG tablet    Myra Gianotti, PA-C Surgical Short Stay/Anesthesiology [facility] Phone [phone] [facility] Phone [phone] 09/27/2021 4:00 PM

## 2021-09-27 NOTE — Telephone Encounter (Signed)
Pt called requesting a call back about her medication. She has questions weather to start them again. Please call pt at [phone].

## 2021-09-27 NOTE — Progress Notes (Signed)
PCP - Dr Stoney Bang Cardiologist - Dr Allison Quarry  Nephrology - Dr Ulice Bold  Chest x-ray - 08/29/21 (2V) EKG - 08/29/21 Stress Test - n/a ECHO - 07/13/21 Cardiac Cath - 01/15/21  ICD Pacemaker/Loop - n/a  Sleep Study -  n/a CPAP - none  Do not take Glimepiride on the morning of surgery.  If your blood sugar is less than 70 mg/dL, you will need to treat for low blood sugar: Treat a low blood sugar (less than 70 mg/dL) with  cup of clear juice (cranberry or apple), 4 glucose tablets, OR glucose gel. Recheck blood sugar in 15 minutes after treatment (to make sure it is greater than 70 mg/dL). If your blood sugar is not greater than 70 mg/dL on recheck, call [phone] for further instructions.  ERAS: Clear liquids til 8:15 AM DOS  Anesthesia review: Yes  STOP now taking any Aspirin (unless otherwise instructed by your surgeon), Aleve, Naproxen, Ibuprofen, Motrin, Advil, Goody's, BC's, all herbal medications, fish oil, and all vitamins.   Coronavirus Screening Do you have any of the following symptoms:  Cough yes/no: No Fever (>100.63F)  yes/no: No Runny nose yes/no: No Sore throat yes/no: No Difficulty breathing/shortness of breath  yes/no: No  Have you traveled in the last 14 days and where? yes/no: No  Patient verbalized understanding of instructions that were given via phone.

## 2021-09-27 NOTE — Progress Notes (Signed)
Patient stated that she took herself off plavix with the upcoming surgical procedure.  Last dose of Plavix was on 09/11/21.  I advised her to follow up with Dr Sharol Given about her plavix instructions prior to surgery.  Patient agreed to call Dr Jess Barters office today (09/27/21) for plavix instructions for upcoming surgery.

## 2021-09-28 ENCOUNTER — Other Ambulatory Visit: Payer: Self-pay

## 2021-09-28 ENCOUNTER — Ambulatory Visit (HOSPITAL_BASED_OUTPATIENT_CLINIC_OR_DEPARTMENT_OTHER): Payer: 59 | Admitting: Vascular Surgery

## 2021-09-28 ENCOUNTER — Ambulatory Visit (HOSPITAL_COMMUNITY): Payer: 59 | Admitting: Vascular Surgery

## 2021-09-28 ENCOUNTER — Telehealth: Payer: Self-pay

## 2021-09-28 ENCOUNTER — Encounter (HOSPITAL_COMMUNITY): Payer: Self-pay | Admitting: Orthopedic Surgery

## 2021-09-28 ENCOUNTER — Encounter (HOSPITAL_COMMUNITY)
Admission: RE | Disposition: A | Discharge status: Home / Self Care | Payer: Self-pay | Source: Home / Self Care | Attending: Orthopedic Surgery

## 2021-09-28 ENCOUNTER — Other Ambulatory Visit: Payer: Self-pay | Admitting: Orthopedic Surgery

## 2021-09-28 ENCOUNTER — Ambulatory Visit (HOSPITAL_COMMUNITY)
Admission: RE | Admit: 2021-09-28 | Discharge: 2021-09-28 | Disposition: A | Discharge status: Home / Self Care | Payer: 59 | Attending: Orthopedic Surgery | Admitting: Orthopedic Surgery

## 2021-09-28 DIAGNOSIS — N2581 Secondary hyperparathyroidism of renal origin: Secondary | ICD-10-CM | POA: Insufficient documentation

## 2021-09-28 DIAGNOSIS — Z955 Presence of coronary angioplasty implant and graft: Secondary | ICD-10-CM | POA: Insufficient documentation

## 2021-09-28 DIAGNOSIS — I252 Old myocardial infarction: Secondary | ICD-10-CM | POA: Diagnosis not present

## 2021-09-28 DIAGNOSIS — I130 Hypertensive heart and chronic kidney disease with heart failure and stage 1 through stage 4 chronic kidney disease, or unspecified chronic kidney disease: Secondary | ICD-10-CM | POA: Diagnosis not present

## 2021-09-28 DIAGNOSIS — E11621 Type 2 diabetes mellitus with foot ulcer: Secondary | ICD-10-CM

## 2021-09-28 DIAGNOSIS — F17210 Nicotine dependence, cigarettes, uncomplicated: Secondary | ICD-10-CM | POA: Insufficient documentation

## 2021-09-28 DIAGNOSIS — D631 Anemia in chronic kidney disease: Secondary | ICD-10-CM | POA: Diagnosis not present

## 2021-09-28 DIAGNOSIS — I5032 Chronic diastolic (congestive) heart failure: Secondary | ICD-10-CM | POA: Insufficient documentation

## 2021-09-28 DIAGNOSIS — E1140 Type 2 diabetes mellitus with diabetic neuropathy, unspecified: Secondary | ICD-10-CM | POA: Insufficient documentation

## 2021-09-28 DIAGNOSIS — F319 Bipolar disorder, unspecified: Secondary | ICD-10-CM | POA: Insufficient documentation

## 2021-09-28 DIAGNOSIS — N184 Chronic kidney disease, stage 4 (severe): Secondary | ICD-10-CM | POA: Diagnosis not present

## 2021-09-28 DIAGNOSIS — I2510 Atherosclerotic heart disease of native coronary artery without angina pectoris: Secondary | ICD-10-CM

## 2021-09-28 DIAGNOSIS — Z9981 Dependence on supplemental oxygen: Secondary | ICD-10-CM | POA: Diagnosis not present

## 2021-09-28 DIAGNOSIS — I10 Essential (primary) hypertension: Secondary | ICD-10-CM | POA: Diagnosis not present

## 2021-09-28 DIAGNOSIS — E785 Hyperlipidemia, unspecified: Secondary | ICD-10-CM | POA: Insufficient documentation

## 2021-09-28 DIAGNOSIS — J449 Chronic obstructive pulmonary disease, unspecified: Secondary | ICD-10-CM | POA: Insufficient documentation

## 2021-09-28 DIAGNOSIS — L97421 Non-pressure chronic ulcer of left heel and midfoot limited to breakdown of skin: Secondary | ICD-10-CM | POA: Diagnosis not present

## 2021-09-28 DIAGNOSIS — E1122 Type 2 diabetes mellitus with diabetic chronic kidney disease: Secondary | ICD-10-CM | POA: Diagnosis not present

## 2021-09-28 DIAGNOSIS — M797 Fibromyalgia: Secondary | ICD-10-CM | POA: Insufficient documentation

## 2021-09-28 DIAGNOSIS — G6181 Chronic inflammatory demyelinating polyneuritis: Secondary | ICD-10-CM | POA: Insufficient documentation

## 2021-09-28 DIAGNOSIS — L97429 Non-pressure chronic ulcer of left heel and midfoot with unspecified severity: Secondary | ICD-10-CM

## 2021-09-28 HISTORY — DX: Unspecified asthma, uncomplicated: J45.909

## 2021-09-28 HISTORY — DX: Inflammatory liver disease, unspecified: K75.9

## 2021-09-28 HISTORY — DX: Fibromyalgia: M79.7

## 2021-09-28 HISTORY — PX: I & D EXTREMITY: SHX5045

## 2021-09-28 HISTORY — DX: Depression, unspecified: F32.A

## 2021-09-28 HISTORY — DX: Anemia, unspecified: D64.9

## 2021-09-28 HISTORY — DX: Anxiety disorder, unspecified: F41.9

## 2021-09-28 HISTORY — DX: Acute myocardial infarction, unspecified: I21.9

## 2021-09-28 HISTORY — DX: Personal history of urinary calculi: Z87.442

## 2021-09-28 HISTORY — DX: Hyperlipidemia, unspecified: E78.5

## 2021-09-28 HISTORY — DX: Essential (primary) hypertension: I10

## 2021-09-28 LAB — BASIC METABOLIC PANEL
Anion gap: 10 (ref 5–15)
BUN: 29 mg/dL — ABNORMAL HIGH (ref 6–20)
CO2: 20 mmol/L — ABNORMAL LOW (ref 22–32)
Calcium: 8.5 mg/dL — ABNORMAL LOW (ref 8.9–10.3)
Chloride: 109 mmol/L (ref 98–111)
Creatinine, Ser: 2.45 mg/dL — ABNORMAL HIGH (ref 0.44–1.00)
GFR, Estimated: 23 mL/min — ABNORMAL LOW (ref >60)
Glucose, Bld: 99 mg/dL (ref 70–99)
Potassium: 4.1 mmol/L (ref 3.5–5.1)
Sodium: 139 mmol/L (ref 135–145)

## 2021-09-28 LAB — CBC
HCT: 27.3 % — ABNORMAL LOW (ref 36.0–46.0)
Hemoglobin: 8.5 g/dL — ABNORMAL LOW (ref 12.0–15.0)
MCH: 27.4 pg (ref 26.0–34.0)
MCHC: 31.1 g/dL (ref 30.0–36.0)
MCV: 88.1 fL (ref 80.0–100.0)
Platelets: 253 10*3/uL (ref 150–400)
RBC: 3.1 MIL/uL — ABNORMAL LOW (ref 3.87–5.11)
RDW: 19.7 % — ABNORMAL HIGH (ref 11.5–15.5)
WBC: 12.1 10*3/uL — ABNORMAL HIGH (ref 4.0–10.5)
nRBC: 0 % (ref 0.0–0.2)

## 2021-09-28 LAB — GLUCOSE, CAPILLARY
Glucose-Capillary: 111 mg/dL — ABNORMAL HIGH (ref 70–99)
Glucose-Capillary: 123 mg/dL — ABNORMAL HIGH (ref 70–99)
Glucose-Capillary: 130 mg/dL — ABNORMAL HIGH (ref 70–99)

## 2021-09-28 SURGERY — IRRIGATION AND DEBRIDEMENT EXTREMITY
Anesthesia: General | Laterality: Left

## 2021-09-28 MED ORDER — LIDOCAINE 2% (20 MG/ML) 5 ML SYRINGE
INTRAMUSCULAR | Status: DC | PRN
  Administered 2021-09-28: 100 mg via INTRAVENOUS

## 2021-09-28 MED ORDER — CEFAZOLIN SODIUM-DEXTROSE 2-4 GM/100ML-% IV SOLN
INTRAVENOUS | Status: AC
  Filled 2021-09-28: qty 100

## 2021-09-28 MED ORDER — MIDAZOLAM HCL 5 MG/5ML IJ SOLN
INTRAMUSCULAR | Status: DC | PRN
  Administered 2021-09-28: 2 mg via INTRAVENOUS

## 2021-09-28 MED ORDER — ONDANSETRON HCL 4 MG/2ML IJ SOLN
INTRAMUSCULAR | Status: DC | PRN
  Administered 2021-09-28: 4 mg via INTRAVENOUS

## 2021-09-28 MED ORDER — LACTATED RINGERS IV SOLN: INTRAVENOUS | Status: DC

## 2021-09-28 MED ORDER — PROPOFOL 10 MG/ML IV BOLUS
INTRAVENOUS | Status: AC
  Filled 2021-09-28: qty 20

## 2021-09-28 MED ORDER — FENTANYL CITRATE (PF) 250 MCG/5ML IJ SOLN
INTRAMUSCULAR | Status: AC
  Filled 2021-09-28: qty 5

## 2021-09-28 MED ORDER — MIDAZOLAM HCL 2 MG/2ML IJ SOLN
INTRAMUSCULAR | Status: AC
  Filled 2021-09-28: qty 2

## 2021-09-28 MED ORDER — OXYCODONE-ACETAMINOPHEN 5-325 MG PO TABS: 1.0000 | ORAL_TABLET | ORAL | 0 refills | Status: DC | PRN

## 2021-09-28 MED ORDER — PROPOFOL 10 MG/ML IV BOLUS
INTRAVENOUS | Status: DC | PRN
  Administered 2021-09-28: 180 mg via INTRAVENOUS

## 2021-09-28 MED ORDER — ORAL CARE MOUTH RINSE: 15.0000 mL | Freq: Once | OROMUCOSAL | Status: AC

## 2021-09-28 MED ORDER — 0.9 % SODIUM CHLORIDE (POUR BTL) OPTIME
TOPICAL | Status: DC | PRN
  Administered 2021-09-28: 1000 mL

## 2021-09-28 MED ORDER — PHENYLEPHRINE 80 MCG/ML (10ML) SYRINGE FOR IV PUSH (FOR BLOOD PRESSURE SUPPORT)
PREFILLED_SYRINGE | INTRAVENOUS | Status: DC | PRN
  Administered 2021-09-28 (×2): 160 ug via INTRAVENOUS
  Administered 2021-09-28: 80 ug via INTRAVENOUS

## 2021-09-28 MED ORDER — SODIUM CHLORIDE 0.9 % IV SOLN: Freq: Once | INTRAVENOUS | Status: AC

## 2021-09-28 MED ORDER — PHENYLEPHRINE HCL-NACL 20-0.9 MG/250ML-% IV SOLN
INTRAVENOUS | Status: DC | PRN
  Administered 2021-09-28: 20 ug/min via INTRAVENOUS

## 2021-09-28 MED ORDER — SODIUM CHLORIDE 0.9 % IV SOLN: INTRAVENOUS | Status: DC | PRN

## 2021-09-28 MED ORDER — FENTANYL CITRATE (PF) 250 MCG/5ML IJ SOLN
INTRAMUSCULAR | Status: DC | PRN
Start: 2021-09-28 — End: 2021-09-28
  Administered 2021-09-28 (×2): 50 ug via INTRAVENOUS

## 2021-09-28 MED ORDER — CHLORHEXIDINE GLUCONATE 0.12 % MT SOLN
15.0000 mL | Freq: Once | OROMUCOSAL | Status: AC
  Administered 2021-09-28: 15 mL via OROMUCOSAL
  Filled 2021-09-28: qty 15

## 2021-09-28 MED ORDER — CEFAZOLIN SODIUM-DEXTROSE 2-4 GM/100ML-% IV SOLN
2.0000 g | INTRAVENOUS | Status: AC
  Administered 2021-09-28: 2 g via INTRAVENOUS

## 2021-09-28 MED ORDER — OXYCODONE HCL 5 MG PO CAPS: 5.0000 mg | ORAL_CAPSULE | ORAL | 0 refills | Status: DC | PRN

## 2021-09-28 SURGICAL SUPPLY — 43 items
BAG COUNTER SPONGE SURGICOUNT (BAG) IMPLANT
BLADE SURG 21 STRL SS (BLADE) ×3 IMPLANT
BNDG COHESIVE 3X5 TAN ST LF (GAUZE/BANDAGES/DRESSINGS) ×1 IMPLANT
BNDG COHESIVE 6X5 TAN STRL LF (GAUZE/BANDAGES/DRESSINGS) IMPLANT
BNDG ELASTIC 4X5.8 VLCR STR LF (GAUZE/BANDAGES/DRESSINGS) ×1 IMPLANT
BNDG GAUZE ELAST 4 BULKY (GAUZE/BANDAGES/DRESSINGS) ×4 IMPLANT
COVER SURGICAL LIGHT HANDLE (MISCELLANEOUS) ×5 IMPLANT
DRAPE U-SHAPE 47X51 STRL (DRAPES) ×2 IMPLANT
DRESSING PEEL AND PLAC PRVNA20 (GAUZE/BANDAGES/DRESSINGS) IMPLANT
DRESSING PREVENA PLUS CUSTOM (GAUZE/BANDAGES/DRESSINGS) IMPLANT
DRESSING VERAFLO CLEANS CC MED (GAUZE/BANDAGES/DRESSINGS) IMPLANT
DRSG ADAPTIC 3X8 NADH LF (GAUZE/BANDAGES/DRESSINGS) ×2 IMPLANT
DRSG PEEL AND PLACE PREVENA 20 (GAUZE/BANDAGES/DRESSINGS) ×2
DRSG PREVENA PLUS CUSTOM (GAUZE/BANDAGES/DRESSINGS) ×2
DRSG VERAFLO CLEANSE CC MED (GAUZE/BANDAGES/DRESSINGS) ×2
DURAPREP 26ML APPLICATOR (WOUND CARE) ×2 IMPLANT
ELECT REM PT RETURN 9FT ADLT (ELECTROSURGICAL)
ELECTRODE REM PT RTRN 9FT ADLT (ELECTROSURGICAL) IMPLANT
GAUZE SPONGE 4X4 12PLY STRL (GAUZE/BANDAGES/DRESSINGS) ×2 IMPLANT
GLOVE BIOGEL PI IND STRL 9 (GLOVE) ×1 IMPLANT
GLOVE BIOGEL PI INDICATOR 9 (GLOVE) ×1
GLOVE SURG ORTHO 9.0 STRL STRW (GLOVE) ×2 IMPLANT
GOWN STRL REUS W/ TWL XL LVL3 (GOWN DISPOSABLE) ×2 IMPLANT
GOWN STRL REUS W/TWL XL LVL3 (GOWN DISPOSABLE) ×2
GRAFT SKIN WND MICRO 38 (Tissue) ×1 IMPLANT
GRAFT SKIN WND SURGIBIND 3X7 (Tissue) ×1 IMPLANT
HANDPIECE INTERPULSE COAX TIP (DISPOSABLE)
KIT BASIN OR (CUSTOM PROCEDURE TRAY) ×2 IMPLANT
KIT TURNOVER KIT B (KITS) ×2 IMPLANT
MANIFOLD NEPTUNE II (INSTRUMENTS) ×2 IMPLANT
NS IRRIG 1000ML POUR BTL (IV SOLUTION) ×2 IMPLANT
PACK ORTHO EXTREMITY (CUSTOM PROCEDURE TRAY) ×2 IMPLANT
PAD ARMBOARD 7.5X6 YLW CONV (MISCELLANEOUS) ×4 IMPLANT
PAD NEG PRESSURE SENSATRAC (MISCELLANEOUS) ×1 IMPLANT
SET HNDPC FAN SPRY TIP SCT (DISPOSABLE) IMPLANT
SPONGE T-LAP 18X18 ~~LOC~~+RFID (SPONGE) ×1 IMPLANT
STOCKINETTE IMPERVIOUS 9X36 MD (GAUZE/BANDAGES/DRESSINGS) IMPLANT
SUT ETHILON 2 0 PSLX (SUTURE) ×3 IMPLANT
SWAB COLLECTION DEVICE MRSA (MISCELLANEOUS) ×2 IMPLANT
SWAB CULTURE ESWAB REG 1ML (MISCELLANEOUS) IMPLANT
TOWEL GREEN STERILE (TOWEL DISPOSABLE) ×2 IMPLANT
TUBE CONNECTING 12X1/4 (SUCTIONS) ×2 IMPLANT
YANKAUER SUCT BULB TIP NO VENT (SUCTIONS) ×3 IMPLANT

## 2021-09-28 NOTE — Telephone Encounter (Signed)
I called pt and she advised that there is a national back order on Oxycodone with tylenol. Her pharmacy can not fill the rx you gave today. Can you please sent Oxycodone without the tylenol to the [facility]?

## 2021-09-28 NOTE — Transfer of Care (Signed)
Immediate Anesthesia Transfer of Care Note  Patient: Talia Hoheisel  Procedure(s) Performed: LEFT HEEL DEBRIDEMENT AND TISSUE GRAFT (Left)  Patient Location: PACU  Anesthesia Type:General  Level of Consciousness: drowsy  Airway & Oxygen Therapy: Patient Spontanous Breathing and Patient connected to face mask oxygen  Post-op Assessment: Report given to RN and Post -op Vital signs reviewed and stable  Post vital signs: Reviewed and stable  Last Vitals:  Vitals Value Taken Time  BP    Temp    Pulse 75 09/28/21 1148  Resp 10 09/28/21 1148  SpO2 100 % 09/28/21 1148  Vitals shown include unvalidated device data.  Last Pain:  Vitals:   09/28/21 0846  TempSrc:   PainSc: 6       Patients Stated Pain Goal: 1 (90/93/11 2162)  Complications: No notable events documented.

## 2021-09-28 NOTE — Progress Notes (Signed)
Contacted Dr. Sharol Given and made him aware that patient has a documented allergy to Penicillin, Amoxicillin,and Sulfa Antibiotics. Dr. Sharol Given made aware that per patient her reaction to Penicillin was hives and shortness of breath at age 56 and that she has not received any since then. Per Dr. Sharol Given- ok to release order for Ancef 2 grams.

## 2021-09-28 NOTE — Telephone Encounter (Signed)
Patient contacted the office and states that she had surgery today (09/28/2021) with Dr. Sharol Given and pharmacy does not have prescription in stock and would like another medication sent it.  Surgery that was preformed is a LEFT HEEL DEBRIDEMENT AND TISSUE GRAFT  [phone] (home)

## 2021-09-28 NOTE — Telephone Encounter (Signed)
Message sent to Dr. Sharol Given to advise. This is a duplicate.

## 2021-09-28 NOTE — H&P (Signed)
Tracy Walsh is an 56 y.o. female.   Chief Complaint: Necrotic left heel ulcer HPI: Patient is a 56 year old woman who presents for evaluation for left heel ulcer she has been on doxycycline using a PRAFO for pressure offloading and Dial soap cleansing and dressing changes.  Patient complains of increasing pain and odor with drainage.  Past Medical History:  Diagnosis Date   Anemia    Anxiety    Asthma    Bipolar disorder ([place]) 2018   type 2   CHF (congestive heart failure) (HCC)    CIDP (chronic inflammatory demyelinating polyneuropathy) (HCC)    CKD (chronic kidney disease)    COPD (chronic obstructive pulmonary disease) (HCC)    Depression    Diabetes mellitus without complication (HCC)    type 2   Diabetic foot ulcers (HCC)    Fibromyalgia    GERD (gastroesophageal reflux disease)    Hepatitis    Hep as a child - patient can give blood   History of blood transfusion 08/2021   1 unit per patient   History of esophagogastroduodenoscopy (EGD) 2018   History of kidney stones    passed stones   HLD (hyperlipidemia)    Hypertension    Migraines    Myocardial infarction (HCC)    x 2   Neuropathy    legs and feet - bilateral    Past Surgical History:  Procedure Laterality Date   ABDOMINAL HYSTERECTOMY     APPENDECTOMY     BILATERAL NASAL FRACTURE CLOSED REDUCTION  10/2020   CHOLECYSTECTOMY     COLONOSCOPY     CORONARY ANGIOPLASTY WITH STENT PLACEMENT  01/15/2021   FOOT SURGERY Right    I & D   SHOULDER SURGERY Bilateral    SPINAL FUSION     UPPER GI ENDOSCOPY      History reviewed. No pertinent family history. Social History:  reports that she has been smoking cigarettes. She has been smoking an average of .5 packs per day. She has never used smokeless tobacco. She reports that she does not drink alcohol and does not use drugs.  Allergies:  Allergies  Allergen Reactions   Amoxicillin Anaphylaxis   Clarithromycin Anaphylaxis, Diarrhea, Itching, Nausea And  Vomiting and Rash   Codeine Itching   Hydrocodone Itching   Moxifloxacin Anaphylaxis, Anxiety, Hives, Itching, Nausea And Vomiting, Palpitations and Shortness Of Breath   Penicillins Anaphylaxis   Sulfa Antibiotics Anaphylaxis   Nitrofurantoin Nausea And Vomiting   Crab (Diagnostic) Hives   Latex Hives, Itching, Rash and Swelling    No medications prior to admission.    No results found for this or any previous visit (from the past 48 hour(s)). No results found.  Review of Systems  All other systems reviewed and are negative.   Height 5\' 8"  (1.727 m), weight 78 kg. Physical Exam  Patient is alert, oriented, no adenopathy, well-dressed, normal affect, normal respiratory effort. Examination patient has a palpable dorsalis pedis and posterior tibial pulse.  She does have a history of renal disease she was on Plavix for cardiac stents that were placed 2 years ago.  Semination of the left heel she has a necrotic ulcer which appears superficial and that is 3 cm in diameter with approximately 50% granulation tissue.  There is no ascending cellulitis no purulent drainage. Assessment/Plan 1. Non-pressure chronic ulcer of left heel and midfoot limited to breakdown of skin ([place])       Plan: We will plan for surgical debridement of the left  heel ulcer possible application of tissue graft.  Anticipate application of portable wound VAC.  Newt Minion, MD 09/28/2021, 6:47 AM

## 2021-09-28 NOTE — Op Note (Addendum)
09/28/2021  11:44 AM  PATIENT:  Tracy Walsh    PRE-OPERATIVE DIAGNOSIS:  Ulcer Left Heel  POST-OPERATIVE DIAGNOSIS:  Same  PROCEDURE:  LEFT HEEL DEBRIDEMENT AND TISSUE GRAFT Application of Kerecis micro 38 cm and Kerecis sheet 15 cm. Application of cleanse choice wound VAC sponge  SURGEON:  Newt Minion, MD  PHYSICIAN ASSISTANT:None ANESTHESIA:   General  PREOPERATIVE INDICATIONS:  [name] is a  56 y.o. female with a diagnosis of Ulcer Left Heel who failed conservative measures and elected for surgical management.    The risks benefits and alternatives were discussed with the patient preoperatively including but not limited to the risks of infection, bleeding, nerve injury, cardiopulmonary complications, the need for revision surgery, among others, and the patient was willing to proceed.  OPERATIVE IMPLANTS: Kerecis micro powder 38 cm and Kerecis sheet 3 x 5 cm  @ENCIMAGES @  OPERATIVE FINDINGS: Good healthy bleeding granulation tissue at the base of the wound no exposed bone or tendon.  OPERATIVE PROCEDURE: Patient was brought the operating room underwent a general anesthetic.  After adequate levels anesthesia were obtained patient's left lower extremity was prepped using DuraPrep draped into a sterile field a timeout was called.  A 21 blade knife was used to excise skin and soft tissue and fascia down to healthy viable bleeding tissue.  There is no exposed bone or tendon.  The wound was irrigated with normal saline.  The 38 cm micro powder was applied to the wound this was covered with the Kerecis sheet.  This was secured with staples a blue cleanse choice sponge was applied covered with derma tack this had a good suction fit patient was taken the PACU in stable condition  Debridement type: Excisional Debridement  Side: left  Body Location: heel   Tools used for debridement: scalpel  Pre-debridement Wound size (cm):   Length: 3        Width: 3     Depth: 1    Post-debridement Wound size (cm):   Length: 5        Width: 4     Depth: 1   Debridement depth beyond dead/damaged tissue down to healthy viable tissue: yes  Tissue layer involved: skin, subcutaneous tissue, muscle / fascia  Nature of tissue removed: Devitalized Tissue and Non-viable tissue  Irrigation volume: 1 liter     Irrigation fluid type: Normal Saline     DISCHARGE PLANNING:  Antibiotic duration: Preoperative antibiotics Kefzol  Weightbearing: Nonweightbearing on the left  Pain medication: Percocet  Dressing care/ Wound VAC: Wound VAC for 1 week  Ambulatory devices: Walker or crutches  Discharge to: Home.  Follow-up: In the office 1 week post operative.

## 2021-09-28 NOTE — Anesthesia Procedure Notes (Signed)
Procedure Name: LMA Insertion Date/Time: 09/28/2021 11:11 AM  Performed by: Vonna Drafts, CRNAPre-anesthesia Checklist: Patient identified, Emergency Drugs available, Suction available, Patient being monitored and Timeout performed Patient Re-evaluated:Patient Re-evaluated prior to induction Oxygen Delivery Method: Circle system utilized Preoxygenation: Pre-oxygenation with 100% oxygen Induction Type: IV induction LMA: LMA inserted LMA Size: 4.0 Number of attempts: 1 Tube secured with: Tape Dental Injury: Teeth and Oropharynx as per pre-operative assessment

## 2021-09-30 ENCOUNTER — Encounter (HOSPITAL_COMMUNITY): Payer: Self-pay | Admitting: Orthopedic Surgery

## 2021-10-02 ENCOUNTER — Encounter: Payer: Self-pay | Admitting: Orthopedic Surgery

## 2021-10-03 ENCOUNTER — Ambulatory Visit (INDEPENDENT_AMBULATORY_CARE_PROVIDER_SITE_OTHER): Payer: 59 | Admitting: Family

## 2021-10-03 ENCOUNTER — Encounter: Payer: Self-pay | Admitting: Family

## 2021-10-03 ENCOUNTER — Encounter: Payer: 59 | Admitting: Vascular Surgery

## 2021-10-03 ENCOUNTER — Other Ambulatory Visit: Payer: Self-pay | Admitting: Family

## 2021-10-03 DIAGNOSIS — L89624 Pressure ulcer of left heel, stage 4: Secondary | ICD-10-CM

## 2021-10-03 MED ORDER — OXYCODONE HCL 5 MG PO CAPS: 5.0000 mg | ORAL_CAPSULE | ORAL | 0 refills | Status: DC | PRN

## 2021-10-03 MED ORDER — MUPIROCIN 2 % EX OINT: 1.0000 | TOPICAL_OINTMENT | Freq: Every day | CUTANEOUS | 0 refills | Status: DC

## 2021-10-03 MED ORDER — MUPIROCIN CALCIUM 2 % EX CREA: 1.0000 | TOPICAL_CREAM | Freq: Two times a day (BID) | CUTANEOUS | 0 refills | Status: DC

## 2021-10-03 NOTE — Progress Notes (Signed)
Post-Op Visit Note   Patient: Tracy Walsh           Date of Birth: 03/15/1966           MRN: [id number] Visit Date: 10/03/2021 PCP: Neale Burly, MD  Chief Complaint:  Chief Complaint  Patient presents with   Left Foot - Routine Post Op    09/28/21 left heel deb with kerecis skin graft    HPI:  HPI The patient is a 56 year old woman who presents today as a work in which she is status post left heel debridement with Kerecis application on June 16.  She was concerned for a foul odor coming from her left heel.  Surgical dressing was removed today.  She does continue on doxycycline Ortho Exam On examination of the left heel staples are in place the graft material has come off.  There is necrotic tissue in 90% of the wound bed.  There is no surrounding erythema there is a foul odor there is no exposed bone  Visit Diagnoses:  1. Pressure injury of left heel, stage 4 (HCC)     Plan: She will continue on her doxycycline we will begin with mupirocin dressing changes daily continue her PRAFO boot offload the heel discussed possibility of underlying osteomyelitis.  She will follow-up with 1 week week with Dr. Sharol Given  Follow-Up Instructions: Return in about 8 days (around 10/11/2021).   Imaging: No results found.  Orders:  No orders of the defined types were placed in this encounter.  No orders of the defined types were placed in this encounter.    PMFS History: Patient Active Problem List   Diagnosis Date Noted   Non-pressure chronic ulcer of left heel and midfoot limited to breakdown of skin ([place])    Acute on chronic anemia 09/01/2021   C. difficile colitis 09/01/2021   Chronic combined systolic and diastolic CHF (congestive heart failure) ([place]) 08/31/2021   Chronic respiratory failure with hypoxia ([place]) 08/31/2021   Acute renal failure superimposed on stage 4 chronic kidney disease ([place]) 08/29/2021   Chronic diarrhea 08/29/2021   Hypokalemia 08/29/2021   Essential  hypertension 08/29/2021   Uncontrolled type 2 diabetes mellitus with hyperglycemia, without long-term current use of insulin ([place]) 08/29/2021   Diabetic foot ulcer ([place]) 08/29/2021   CAD (coronary artery disease) 08/29/2021   Past Medical History:  Diagnosis Date   Anemia    Anxiety    Asthma    Bipolar disorder ([place]) 2018   type 2   CHF (congestive heart failure) (HCC)    CIDP (chronic inflammatory demyelinating polyneuropathy) (HCC)    CKD (chronic kidney disease)    COPD (chronic obstructive pulmonary disease) (HCC)    Depression    Diabetes mellitus without complication (HCC)    type 2   Diabetic foot ulcers (HCC)    Fibromyalgia    GERD (gastroesophageal reflux disease)    Hepatitis    Hep as a child - patient can give blood   History of blood transfusion 08/2021   1 unit per patient   History of esophagogastroduodenoscopy (EGD) 2018   History of kidney stones    passed stones   HLD (hyperlipidemia)    Hypertension    Migraines    Myocardial infarction (HCC)    x 2   Neuropathy    legs and feet - bilateral    History reviewed. No pertinent family history.  Past Surgical History:  Procedure Laterality Date   ABDOMINAL HYSTERECTOMY  APPENDECTOMY     BILATERAL NASAL FRACTURE CLOSED REDUCTION  10/2020   CHOLECYSTECTOMY     COLONOSCOPY     CORONARY ANGIOPLASTY WITH STENT PLACEMENT  01/15/2021   FOOT SURGERY Right    I & D   I & D EXTREMITY Left 09/28/2021   Procedure: LEFT HEEL DEBRIDEMENT AND TISSUE GRAFT;  Surgeon: Newt Minion, MD;  Location: [facility];  Service: Orthopedics;  Laterality: Left;   SHOULDER SURGERY Bilateral    SPINAL FUSION     UPPER GI ENDOSCOPY     Social History   Occupational History   Not on file  Tobacco Use   Smoking status: Every Day    Packs/day: 0.50    Types: Cigarettes   Smokeless tobacco: Never  Vaping Use   Vaping Use: Never used  Substance and Sexual Activity   Alcohol use: Never   Drug use: Never   Sexual activity:  Yes    Birth control/protection: Surgical    Comment: Hysterectomy

## 2021-10-03 NOTE — Telephone Encounter (Signed)
SW pt, scheduled today at 2 pm.

## 2021-10-03 NOTE — Anesthesia Postprocedure Evaluation (Signed)
Anesthesia Post Note  Patient: Tracy Walsh  Procedure(s) Performed: LEFT HEEL DEBRIDEMENT AND TISSUE GRAFT (Left)     Patient location during evaluation: PACU Anesthesia Type: General Level of consciousness: awake and alert Pain management: pain level controlled Vital Signs Assessment: post-procedure vital signs reviewed and stable Respiratory status: spontaneous breathing, nonlabored ventilation, respiratory function stable and patient connected to nasal cannula oxygen Cardiovascular status: blood pressure returned to baseline and stable Postop Assessment: no apparent nausea or vomiting Anesthetic complications: no   No notable events documented.  Last Vitals:  Vitals:   09/28/21 0822 09/28/21 1150  BP: (!) 155/77   Pulse: 94   Resp: 18   Temp: 36.6 C 37.1 C  SpO2: 97%     Last Pain:  Vitals:   09/28/21 0846  TempSrc:   PainSc: [place]

## 2021-10-04 ENCOUNTER — Encounter: Payer: Self-pay | Admitting: Orthopedic Surgery

## 2021-10-04 ENCOUNTER — Telehealth: Payer: Self-pay | Admitting: Family

## 2021-10-04 NOTE — Telephone Encounter (Signed)
Prior auth needed for oxycodone. Will complete on [email or web address] Pt informed.

## 2021-10-05 ENCOUNTER — Encounter: Payer: 59 | Admitting: Family

## 2021-10-09 ENCOUNTER — Other Ambulatory Visit: Payer: Self-pay | Admitting: Orthopedic Surgery

## 2021-10-10 ENCOUNTER — Ambulatory Visit: Payer: 59 | Admitting: Family

## 2021-10-11 ENCOUNTER — Encounter (INDEPENDENT_AMBULATORY_CARE_PROVIDER_SITE_OTHER): Payer: Self-pay | Admitting: Gastroenterology

## 2021-10-11 ENCOUNTER — Ambulatory Visit (INDEPENDENT_AMBULATORY_CARE_PROVIDER_SITE_OTHER): Payer: 59 | Admitting: Gastroenterology

## 2021-10-11 DIAGNOSIS — A0471 Enterocolitis due to Clostridium difficile, recurrent: Secondary | ICD-10-CM | POA: Diagnosis not present

## 2021-10-11 DIAGNOSIS — D509 Iron deficiency anemia, unspecified: Secondary | ICD-10-CM | POA: Diagnosis not present

## 2021-10-11 MED ORDER — FIDAXOMICIN 200 MG PO TABS: ORAL_TABLET | ORAL | 0 refills | Status: AC

## 2021-10-11 NOTE — Progress Notes (Signed)
Tracy Walsh, M.D. Gastroenterology & Hepatology [facility] [address] [place], [place] [address] Primary Care Physician: Neale Burly, MD [place] [address]  Referring MD: PCP  Chief Complaint: Chronic diarrhea, history of C. difficile and abdominal pain  History of Present Illness: Tracy Walsh is a 56 y.o. female with PMH history of systolic CHF 9last EG 32-35%, moderate pulmonary hypertension), Chronic inflammatory demyelinating polyneuropathy (CIDP), CKD stage IV, COPD, type 2 diabetes, CAD with LAD stent October 2022, who presents for evaluation of chronic diarrhea, history of C. difficile and abdominal pain.  Per medical record, the patient had a history of C. difficile in the past (first episode possibly May 2022).  Patient reports she was diagnosed with C. Diff in May 2022. States she received possibly 3 courses of antibiotics - had to be hospitalized once for the diarrhea and was given vancomycin and Flagyl. Possibly in June 2022 she had the Dificid. She saw a GI group at that time and underwent EGD/colonoscopy at that time - states she was only found to have a couple of polyps.At that time she was anemic with hemoglobin in the low 7s.   Patient was seen at [facility] as consultation in mid May 2023 after she presented for evaluation of vomiting and chronic diarrhea.  She had positive antigen for C. difficile but negative toxin, PCR came back positive for C. difficile.  GI pathogen panel came back negative.  She was advised to take vancomycin 125 mg every 6 hours for 10 days.  Per evaluation by Dr. Laural Golden, she was recommended to have a repeat EGD and colonoscopy for evaluation after finishing treatment for C. difficile as part of evaluation of chronic iron deficiency anemia.  Most recent labs from 09/28/2021 showed a BUN of 29, creatinine of 2.45, normal electrolytes, CBC with white blood cell count  of 12,100, hemoglobin of 8.5, platelets of 253.  She reports that she has presented persistent diarrhea 3-6 bowel movements which she describes as watery bowel movements without melena or hematocheiza. States she has some fecal soiling sometimes and is wearing pull ups at the moment. She is using Imodium twice a day. She is having intermittent episodes of abdominal pain in her LLQ as well as presence of recurrent bloating. She is having persistent nausea with intermittent vomiting - does not take any medicines.  States that despite taking Dificid, she had diarrhea recurrent 2 months after receiving the antibiotics. She has had diarrhea since then - around August 2022.  States she has received 2 pints of blood in the last month as she has been very anemic. Last transfusion was in May while hospitalized.  The patient denies having any nausea, vomiting, fever, chills, hematochezia, melena, hematemesis, jaundice, pruritus.  Last TDD:2202 -no report available Last Colonoscopy: 2022, no report available but she reports that she had a couple of polyps removed  FHx: neg for any gastrointestinal/liver disease, father prostate cancer, mtoher breast cancer, sister lung cancer Social: smokes half a pack a day, neg alcohol or illicit drug use Surgical: appendectomy, cholecystectomy, hysterectomy.  Past Medical History: Past Medical History:  Diagnosis Date   Anemia    Anxiety    Asthma    Bipolar disorder ([place]) 2018   type 2   CHF (congestive heart failure) (HCC)    CIDP (chronic inflammatory demyelinating polyneuropathy) (HCC)    CKD (chronic kidney disease)    COPD (chronic obstructive pulmonary disease) ([place])  Depression    Diabetes mellitus without complication ([place])    type 2   Diabetic foot ulcers (HCC)    Fibromyalgia    GERD (gastroesophageal reflux disease)    Hepatitis    Hep as a child - patient can give blood   History of blood transfusion 08/2021   1 unit per patient    History of esophagogastroduodenoscopy (EGD) 2018   History of kidney stones    passed stones   HLD (hyperlipidemia)    Hypertension    Migraines    Myocardial infarction (HCC)    x 2   Neuropathy    legs and feet - bilateral    Past Surgical History: Past Surgical History:  Procedure Laterality Date   ABDOMINAL HYSTERECTOMY     APPENDECTOMY     BILATERAL NASAL FRACTURE CLOSED REDUCTION  10/2020   CHOLECYSTECTOMY     COLONOSCOPY     CORONARY ANGIOPLASTY WITH STENT PLACEMENT  01/15/2021   FOOT SURGERY Right    I & D   I & D EXTREMITY Left 09/28/2021   Procedure: LEFT HEEL DEBRIDEMENT AND TISSUE GRAFT;  Surgeon: Newt Minion, MD;  Location: [place];  Service: Orthopedics;  Laterality: Left;   SHOULDER SURGERY Bilateral    SPINAL FUSION     UPPER GI ENDOSCOPY      Family History:History reviewed. No pertinent family history.  Social History: Social History   Tobacco Use  Smoking Status Every Day   Packs/day: 0.50   Types: Cigarettes  Smokeless Tobacco Never   Social History   Substance and Sexual Activity  Alcohol Use Never   Social History   Substance and Sexual Activity  Drug Use Never    Allergies: Allergies  Allergen Reactions   Amoxicillin Anaphylaxis   Clarithromycin Anaphylaxis, Diarrhea, Itching, Nausea And Vomiting and Rash   Codeine Itching   Hydrocodone Itching   Moxifloxacin Anaphylaxis, Anxiety, Hives, Itching, Nausea And Vomiting, Palpitations and Shortness Of Breath   Penicillins Anaphylaxis   Sulfa Antibiotics Anaphylaxis   Nitrofurantoin Nausea And Vomiting   Crab (Diagnostic) Hives   Latex Hives, Itching, Rash and Swelling    Medications: Current Outpatient Medications  Medication Sig Dispense Refill   albuterol (PROVENTIL) (2.5 MG/3ML) 0.083% nebulizer solution Take 2.5 mg by nebulization every 6 (six) hours as needed for wheezing or shortness of breath.     amitriptyline (ELAVIL) 10 MG tablet Take 10 mg by mouth 2 (two) times  daily.     ARIPiprazole (ABILIFY) 5 MG tablet Take 5 mg by mouth daily.     Ascorbic Acid (VITAMIN C) 1000 MG tablet Take 1,000 mg by mouth daily.     atorvastatin (LIPITOR) 40 MG tablet Take 40 mg by mouth at bedtime.     busPIRone (BUSPAR) 15 MG tablet Take 15 mg by mouth 2 (two) times daily.     calcitRIOL (ROCALTROL) 0.25 MCG capsule Take 0.25 mcg by mouth every Monday, Wednesday, and Friday.     Cholecalciferol 25 MCG (1000 UT) tablet Take 1,000 Units by mouth daily.     cyanocobalamin (,VITAMIN B-12,) 1000 MCG/ML injection Inject 1,000 mcg into the muscle every 30 (thirty) days.     doxazosin (CARDURA) 4 MG tablet Take 4 mg by mouth daily.     doxycycline (VIBRA-TABS) 100 MG tablet Take 1 tablet (100 mg total) by mouth 2 (two) times daily. 60 tablet 0   DULoxetine (CYMBALTA) 60 MG capsule Take 60 mg by mouth daily.  ferrous sulfate 325 (65 FE) MG EC tablet Take 325 mg by mouth 2 (two) times daily.     furosemide (LASIX) 80 MG tablet Take 80 mg by mouth daily.     gabapentin (NEURONTIN) 100 MG capsule Take 200 mg by mouth 2 (two) times daily.     glimepiride (AMARYL) 2 MG tablet Take 2 mg by mouth daily before breakfast.     hydrALAZINE (APRESOLINE) 50 MG tablet Take 50 mg by mouth 2 (two) times daily.     isosorbide mononitrate (IMDUR) 30 MG 24 hr tablet Take 30 mg by mouth every morning.     metoprolol succinate (TOPROL-XL) 50 MG 24 hr tablet Take 50 mg by mouth daily.     mupirocin cream (BACTROBAN) 2 % Apply 1 Application topically 2 (two) times daily. 15 g 0   omeprazole (PRILOSEC) 20 MG capsule Take 20 mg by mouth daily.     oxycodone (OXY-IR) 5 MG capsule Take 1 capsule (5 mg total) by mouth every 4 (four) hours as needed. 30 capsule 0   Probiotic Product (PROBIOTIC PO) Take 1 capsule by mouth daily.     zolpidem (AMBIEN) 5 MG tablet Take 5 mg by mouth at bedtime.     aspirin EC 81 MG tablet Take 81 mg by mouth daily. Swallow whole. (Patient not taking: Reported on 10/11/2021)      No current facility-administered medications for this visit.    Review of Systems: GENERAL: negative for malaise, night sweats HEENT: No changes in hearing or vision, no nose bleeds or other nasal problems. NECK: Negative for lumps, goiter, pain and significant neck swelling RESPIRATORY: Negative for cough, wheezing CARDIOVASCULAR: Negative for chest pain, leg swelling, palpitations, orthopnea GI: SEE HPI MUSCULOSKELETAL: Negative for joint pain or swelling, back pain, and muscle pain. SKIN: Negative for lesions, rash PSYCH: Negative for sleep disturbance, mood disorder and recent psychosocial stressors. HEMATOLOGY Negative for prolonged bleeding, bruising easily, and swollen nodes. ENDOCRINE: Negative for cold or heat intolerance, polyuria, polydipsia and goiter. NEURO: negative for tremor, gait imbalance, syncope and seizures. The remainder of the review of systems is noncontributory.   Physical Exam: BP 119/69 (BP Location: Left Arm, Patient Position: Sitting, Cuff Size: Large)   Pulse 81   Temp (!) 97.5 F (36.4 C) (Oral)   Ht 5\' 8"  (1.727 m)   Wt 198 lb (89.8 kg)   BMI 30.11 kg/m  GENERAL: The patient is AO x3, in no acute distress. HEENT: Head is normocephalic and atraumatic. EOMI are intact. Mouth is well hydrated and without lesions. NECK: Supple. No masses LUNGS: Clear to auscultation. No presence of rhonchi/wheezing/rales. Adequate chest expansion HEART: RRR, normal s1 and s2. ABDOMEN: Soft, nontender, no guarding, no peritoneal signs, and nondistended. BS +. No masses. EXTREMITIES: Without any cyanosis, clubbing, rash, lesions or edema. NEUROLOGIC: AOx3, no focal motor deficit. SKIN: no jaundice, no rashes   Imaging/Labs: as above  I personally reviewed and interpreted the available labs, imaging and endoscopic files.  Impression and Plan: Darlyn Repsher is a 56 y.o. female with PMH history of systolic CHF 9last EG 36-62%, moderate pulmonary hypertension),  Chronic inflammatory demyelinating polyneuropathy (CIDP), CKD stage IV, COPD, type 2 diabetes, CAD with LAD stent October 2022, who presents for evaluation of chronic diarrhea, history of C. difficile and abdominal pain.  The patient has presented chronic history of diarrhea.  When her symptoms started she was diagnosed with C. difficile that responded to Dificid but it seems that she had recurrence of  C. difficile as she developed recurrent diarrhea within 2 months of finishing her antibiotic course.  Unfortunately, she has tried other antibiotic regimens with most recent regimen of vancomycin without improvement of her symptoms.  She is taking Imodium frequently.  I do consider that she will benefit from a repeat course of Dificid but we will order VOWST microcephalus to avoid recurrence of an episode in the future.  I explained that if this fails to manage her diarrhea, we will consider referring her to [facility] for fecal transplant evaluation.  The patient has presented history of iron deficiency anemia without overt gastrointestinal bleeding.  Had some endoscopic evaluation at an outside facility but no reports available.  We discussed that she will need to undergo some sort of evaluation once her diarrhea improves which will be discussed in the next appointment.  - Start fidaxomicin 200 mg BID for 5 day, then take it every other day for 20 days - Take VOWST spores for C. Diff recurrence prevention as instructed - We will evaluate anemia later once C. Diff has been treated - RTC 6 weeks  All questions were answered.      Tracy Peppers, MD Gastroenterology and Hepatology [facility]

## 2021-10-11 NOTE — Patient Instructions (Signed)
Start fidaxomicin 200 mg BID for 5 day, then take it every other day for 20 days Take VOWST spores for C. Diff recurrence prevention as instructed We will evaluate your anemia later once C. Diff has been treated

## 2021-10-12 ENCOUNTER — Ambulatory Visit (INDEPENDENT_AMBULATORY_CARE_PROVIDER_SITE_OTHER): Payer: 59 | Admitting: Family

## 2021-10-12 ENCOUNTER — Encounter: Payer: Self-pay | Admitting: Family

## 2021-10-12 DIAGNOSIS — L89624 Pressure ulcer of left heel, stage 4: Secondary | ICD-10-CM

## 2021-10-12 DIAGNOSIS — Z9889 Other specified postprocedural states: Secondary | ICD-10-CM

## 2021-10-12 NOTE — Progress Notes (Signed)
Post-Op Visit Note   Patient: Tracy Walsh           Date of Birth: 1966/03/09           MRN: [id number] Visit Date: 10/12/2021 PCP: Neale Burly, MD  Chief Complaint:  Chief Complaint  Patient presents with   Left Heel - Routine Post Op    09/28/21 debridement w/ kerecis    HPI:  HPI The patient is a 56 year old woman who presents status post left heel debridement with Kerecis placement on June 16 she is currently on doxycycline and using mupirocin dressing changes daily she is wearing a PRAFO.  She is very pleased with the improvement she has seen Ortho Exam Trace edema to the left lower extremity.  On examination of the left posterior heel the staples are in place she has 50% fibrinous exudative tissue there is scant granulation through the exudative tissue.  There is circumferential epithelialization there is no active drainage no surrounding erythema or warmth  Visit Diagnoses: No diagnosis found.  Plan: Continue daily Dial soap cleansing.  Mupirocin dressing changes.  PRAFO to offload heel when at rest.  Follow-Up Instructions: No follow-ups on file.   Imaging: No results found.  Orders:  No orders of the defined types were placed in this encounter.  No orders of the defined types were placed in this encounter.    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 difficile colitis 09/01/2021   Chronic combined systolic and diastolic CHF (congestive heart failure) ([employer] [employer]) 08/31/2021   Chronic respiratory failure with hypoxia ([place]) 08/31/2021   Acute renal failure superimposed on stage 4 chronic kidney disease ([place]) 08/29/2021   Chronic diarrhea 08/29/2021   Hypokalemia 08/29/2021   Essential hypertension 08/29/2021   Uncontrolled type 2 diabetes mellitus with  hyperglycemia, without long-term current use of insulin ([place]) 08/29/2021   Diabetic foot ulcer ([place]) 08/29/2021   CAD (coronary artery disease) 08/29/2021   Past Medical History:  Diagnosis Date   Anemia    Anxiety    Asthma    Bipolar disorder ([place]) 2018   type 2   CHF (congestive heart failure) (HCC)    CIDP (chronic inflammatory demyelinating polyneuropathy) (HCC)    CKD (chronic kidney disease)    COPD (chronic obstructive pulmonary disease) (HCC)    Depression    Diabetes mellitus without complication (HCC)    type 2   Diabetic foot ulcers (HCC)    Fibromyalgia    GERD (gastroesophageal reflux disease)    Hepatitis    Hep as a child - patient can give blood   History of blood transfusion 08/2021   1 unit per patient   History of esophagogastroduodenoscopy (EGD) 2018   History of kidney stones    passed stones   HLD (hyperlipidemia)    Hypertension    Migraines    Myocardial infarction (HCC)    x 2   Neuropathy    legs and feet - bilateral    History reviewed. No pertinent family history.  Past Surgical History:  Procedure Laterality Date   ABDOMINAL HYSTERECTOMY     APPENDECTOMY     BILATERAL NASAL FRACTURE CLOSED REDUCTION  10/2020   CHOLECYSTECTOMY     COLONOSCOPY     CORONARY ANGIOPLASTY WITH STENT PLACEMENT  01/15/2021  FOOT SURGERY Right    I & D   I & D EXTREMITY Left 09/28/2021   Procedure: LEFT HEEL DEBRIDEMENT AND TISSUE GRAFT;  Surgeon: Newt Minion, MD;  Location: [place];  Service: Orthopedics;  Laterality: Left;   SHOULDER SURGERY Bilateral    SPINAL FUSION     UPPER GI ENDOSCOPY     Social History   Occupational History   Not on file  Tobacco Use   Smoking status: Every Day    Packs/day: 0.50    Types: Cigarettes   Smokeless tobacco: Never  Vaping Use   Vaping Use: Never used  Substance and Sexual Activity   Alcohol use: Never   Drug use: Never   Sexual activity: Yes    Birth control/protection: Surgical    Comment: Hysterectomy

## 2021-10-22 ENCOUNTER — Telehealth (INDEPENDENT_AMBULATORY_CARE_PROVIDER_SITE_OTHER): Payer: Self-pay

## 2021-10-22 NOTE — Telephone Encounter (Signed)
Patient was approved for Vowst she has received the shipment with instructions on how to use it.

## 2021-10-22 NOTE — Telephone Encounter (Signed)
Thanks

## 2021-10-30 ENCOUNTER — Ambulatory Visit (INDEPENDENT_AMBULATORY_CARE_PROVIDER_SITE_OTHER): Payer: 59 | Admitting: Family

## 2021-10-30 ENCOUNTER — Encounter: Payer: Self-pay | Admitting: Family

## 2021-10-30 ENCOUNTER — Other Ambulatory Visit: Payer: Self-pay | Admitting: Family

## 2021-10-30 DIAGNOSIS — Z9889 Other specified postprocedural states: Secondary | ICD-10-CM

## 2021-10-30 DIAGNOSIS — L89624 Pressure ulcer of left heel, stage 4: Secondary | ICD-10-CM

## 2021-10-30 MED ORDER — MUPIROCIN CALCIUM 2 % EX CREA: 1.0000 | TOPICAL_CREAM | Freq: Every day | CUTANEOUS | 0 refills | Status: DC

## 2021-10-30 NOTE — Progress Notes (Signed)
Post-Op Visit Note   Patient: Tracy Walsh           Date of Birth: 1965-11-09           MRN: [id number] Visit Date: 10/30/2021 PCP: Neale Burly, MD  Chief Complaint:  Chief Complaint  Patient presents with   Left Foot - Routine Post Op    09/28/21 left heel deb w/ kerecis    HPI:  HPI The patient is a 56 year old woman who presents status post left heel debridement with Kerecis placement on June 16.  she is currently mupirocin dressing changes daily she is wearing a PRAFO.   She has completed a doxycycline course  She states she has been quite active with her walking in the [facility] and is concerned that the ulcer may be bigger now  Ortho Exam 1+ pitting edema to the left lower extremity On examination of the left posterior heel the ulcer has 50% fibrinous exudative tissue. there is scant granulation through the exudative tissue.  There is some surrounding maceration and peeling of the callus of the plantar aspect, extension of her ulcer which is now 4 cm in diameter.  There is no active drainage no surrounding erythema or warmth  Visit Diagnoses: No diagnosis found.  Plan: Back off on ambulation and weightbearing.  Continue daily Dial soap cleansing.  Mupirocin dressing changes.  PRAFO to offload heel when at rest.  Follow-Up Instructions: No follow-ups on file.   Imaging: No results found.  Orders:  No orders of the defined types were placed in this encounter.  No orders of the defined types were placed in this encounter.    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 difficile colitis 09/01/2021   Chronic combined systolic and diastolic CHF (congestive heart failure) ([place]) 08/31/2021   Chronic respiratory failure with hypoxia ([place]) 08/31/2021   Acute  renal failure superimposed on stage 4 chronic kidney disease ([place]) 08/29/2021   Chronic diarrhea 08/29/2021   Hypokalemia 08/29/2021   Essential hypertension 08/29/2021   Uncontrolled type 2 diabetes mellitus with hyperglycemia, without long-term current use of insulin ([place]) 08/29/2021   Diabetic foot ulcer ([place]) 08/29/2021   CAD (coronary artery disease) 08/29/2021   Past Medical History:  Diagnosis Date   Anemia    Anxiety    Asthma    Bipolar disorder ([place]) 2018   type 2   CHF (congestive heart failure) (HCC)    CIDP (chronic inflammatory demyelinating polyneuropathy) (HCC)    CKD (chronic kidney disease)    COPD (chronic obstructive pulmonary disease) (HCC)    Depression    Diabetes mellitus without complication (HCC)    type 2   Diabetic foot ulcers (HCC)    Fibromyalgia    GERD (gastroesophageal reflux disease)    Hepatitis    Hep as a child - patient can give blood   History of blood transfusion 08/2021   1 unit per patient   History of esophagogastroduodenoscopy (EGD) 2018   History of kidney stones    passed stones   HLD (hyperlipidemia)    Hypertension    Migraines    Myocardial infarction (HCC)    x 2   Neuropathy    legs and feet - bilateral    History reviewed. No pertinent family history.  Past Surgical History:  Procedure Laterality Date   ABDOMINAL HYSTERECTOMY     APPENDECTOMY     BILATERAL NASAL FRACTURE CLOSED REDUCTION  10/2020   CHOLECYSTECTOMY     COLONOSCOPY     CORONARY ANGIOPLASTY WITH STENT PLACEMENT  01/15/2021   FOOT SURGERY Right    I & D   I & D EXTREMITY Left 09/28/2021   Procedure: LEFT HEEL DEBRIDEMENT AND TISSUE GRAFT;  Surgeon: Newt Minion, MD;  Location: [place];  Service: Orthopedics;  Laterality: Left;   SHOULDER SURGERY Bilateral    SPINAL FUSION     UPPER GI ENDOSCOPY     Social History   Occupational History   Not on file  Tobacco Use   Smoking status: Every Day    Packs/day: 0.50    Types: Cigarettes    Smokeless tobacco: Never  Vaping Use   Vaping Use: Never used  Substance and Sexual Activity   Alcohol use: Never   Drug use: Never   Sexual activity: Yes    Birth control/protection: Surgical    Comment: Hysterectomy

## 2021-10-31 ENCOUNTER — Other Ambulatory Visit: Payer: Self-pay | Admitting: Family

## 2021-10-31 MED ORDER — MUPIROCIN 2 % EX OINT: 1.0000 | TOPICAL_OINTMENT | Freq: Every day | CUTANEOUS | 0 refills | Status: AC

## 2021-11-02 ENCOUNTER — Encounter (HOSPITAL_COMMUNITY): Payer: Self-pay | Admitting: Hematology

## 2021-11-02 ENCOUNTER — Inpatient Hospital Stay (HOSPITAL_COMMUNITY): Payer: 59

## 2021-11-02 ENCOUNTER — Inpatient Hospital Stay (HOSPITAL_COMMUNITY): Payer: 59 | Attending: Hematology | Admitting: Hematology

## 2021-11-02 VITALS — BP 173/92 | HR 72 | Temp 98.3°F | Resp 18 | Ht 68.0 in | Wt 189.0 lb

## 2021-11-02 DIAGNOSIS — Z993 Dependence on wheelchair: Secondary | ICD-10-CM | POA: Insufficient documentation

## 2021-11-02 DIAGNOSIS — F17210 Nicotine dependence, cigarettes, uncomplicated: Secondary | ICD-10-CM | POA: Diagnosis not present

## 2021-11-02 DIAGNOSIS — Z800 Family history of malignant neoplasm of digestive organs: Secondary | ICD-10-CM | POA: Diagnosis not present

## 2021-11-02 DIAGNOSIS — Z803 Family history of malignant neoplasm of breast: Secondary | ICD-10-CM | POA: Insufficient documentation

## 2021-11-02 DIAGNOSIS — D509 Iron deficiency anemia, unspecified: Secondary | ICD-10-CM | POA: Insufficient documentation

## 2021-11-02 DIAGNOSIS — Z8042 Family history of malignant neoplasm of prostate: Secondary | ICD-10-CM | POA: Insufficient documentation

## 2021-11-02 DIAGNOSIS — Z801 Family history of malignant neoplasm of trachea, bronchus and lung: Secondary | ICD-10-CM | POA: Diagnosis not present

## 2021-11-02 DIAGNOSIS — N189 Chronic kidney disease, unspecified: Secondary | ICD-10-CM | POA: Diagnosis not present

## 2021-11-02 DIAGNOSIS — D631 Anemia in chronic kidney disease: Secondary | ICD-10-CM | POA: Diagnosis not present

## 2021-11-02 DIAGNOSIS — Z85820 Personal history of malignant melanoma of skin: Secondary | ICD-10-CM | POA: Insufficient documentation

## 2021-11-02 DIAGNOSIS — D649 Anemia, unspecified: Secondary | ICD-10-CM | POA: Insufficient documentation

## 2021-11-02 LAB — CBC WITH DIFFERENTIAL/PLATELET
Abs Immature Granulocytes: 0.05 10*3/uL (ref 0.00–0.07)
Basophils Absolute: 0.1 10*3/uL (ref 0.0–0.1)
Basophils Relative: 0 %
Eosinophils Absolute: 0.2 10*3/uL (ref 0.0–0.5)
Eosinophils Relative: 2 %
HCT: 23.6 % — ABNORMAL LOW (ref 36.0–46.0)
Hemoglobin: 7.2 g/dL — ABNORMAL LOW (ref 12.0–15.0)
Immature Granulocytes: 0 %
Lymphocytes Relative: 7 %
Lymphs Abs: 0.8 10*3/uL (ref 0.7–4.0)
MCH: 28.3 pg (ref 26.0–34.0)
MCHC: 30.5 g/dL (ref 30.0–36.0)
MCV: 92.9 fL (ref 80.0–100.0)
Monocytes Absolute: 0.8 10*3/uL (ref 0.1–1.0)
Monocytes Relative: 7 %
Neutro Abs: 9.7 10*3/uL — ABNORMAL HIGH (ref 1.7–7.7)
Neutrophils Relative %: 84 %
Platelets: 214 10*3/uL (ref 150–400)
RBC: 2.54 MIL/uL — ABNORMAL LOW (ref 3.87–5.11)
RDW: 18.6 % — ABNORMAL HIGH (ref 11.5–15.5)
WBC: 11.6 10*3/uL — ABNORMAL HIGH (ref 4.0–10.5)
nRBC: 0 % (ref 0.0–0.2)

## 2021-11-02 LAB — RETICULOCYTES
Immature Retic Fract: 11.4 % (ref 2.3–15.9)
RBC.: 2.55 MIL/uL — ABNORMAL LOW (ref 3.87–5.11)
Retic Count, Absolute: 49.2 10*3/uL (ref 19.0–186.0)
Retic Ct Pct: 1.9 % (ref 0.4–3.1)

## 2021-11-02 LAB — SAMPLE TO BLOOD BANK

## 2021-11-02 LAB — LACTATE DEHYDROGENASE: LDH: 201 U/L — ABNORMAL HIGH (ref 98–192)

## 2021-11-02 LAB — DIRECT ANTIGLOBULIN TEST (NOT AT ARMC)
DAT, IgG: NEGATIVE
DAT, complement: NEGATIVE

## 2021-11-02 NOTE — Progress Notes (Signed)
[facility] [address], [place] [address]   CLINIC:  Medical Oncology/Hematology  Patient Care Team: Neale Burly, MD as PCP - General (Internal Medicine) Derek Jack, MD as Medical Oncologist (Hematology)  CHIEF COMPLAINTS/PURPOSE OF CONSULTATION:  Evaluation for normocytic anemia  HISTORY OF PRESENTING ILLNESS:  Tracy Walsh 56 y.o. female is here because of evaluation for normocytic anemia, at the request of Dr. Theador Hawthorne.  Today she reports being well. She reports a history of stage IV kidney failure. She also reports history of severe DM. 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 She previously received IVIG, and her last treatment was 1 year ago. She reports a prior history of melanoma, 1 on her left breast and 1 on her left leg, in 1993. She denies hematochezia and black stools. She underwent hysterectomy when she was 56 years old. She reports 1 episode of hematuria yesterday. She reports a history of kidney stones. She denies weight loss in the past 6 months. She denies history of iron infusions. She reports 3 blood transfusions over the past 6 months. She has been taking an iron tablet BID for the past 2 months, and she reports tolerating it well without constipation. She reports increasing fatigue. She currently has a diabetic ulcer on her left foot for which she underwent surgery.   She is not currently working, but previously she worked as a Chief Strategy Officer. She currently smokes 1/2 ppd and has smoked since she was 56 years old. She denies family history of anemia. Her sister died with lung cancer, her mother had breast cancer, her father had prostate cancer, and her maternal grandfather had pancreatic cancer.  MEDICAL HISTORY:  Past Medical History:  Diagnosis Date   Anemia    Anxiety    Asthma    Bipolar disorder ([place]) 2018   type 2   CHF (congestive heart failure) (HCC)    CIDP (chronic inflammatory demyelinating polyneuropathy) (HCC)    CKD  (chronic kidney disease)    COPD (chronic obstructive pulmonary disease) (HCC)    Depression    Diabetes mellitus without complication (HCC)    type 2   Diabetic foot ulcers (HCC)    Fibromyalgia    GERD (gastroesophageal reflux disease)    Hepatitis    Hep as a child - patient can give blood   History of blood transfusion 08/2021   1 unit per patient   History of esophagogastroduodenoscopy (EGD) 2018   History of kidney stones    passed stones   HLD (hyperlipidemia)    Hypertension    Migraines    Myocardial infarction (HCC)    x 2   Neuropathy    legs and feet - bilateral    SURGICAL HISTORY: Past Surgical History:  Procedure Laterality Date   ABDOMINAL HYSTERECTOMY     APPENDECTOMY     BILATERAL NASAL FRACTURE CLOSED REDUCTION  10/2020   CHOLECYSTECTOMY     COLONOSCOPY     CORONARY ANGIOPLASTY WITH STENT PLACEMENT  01/15/2021   FOOT SURGERY Right    I & D   I & D EXTREMITY Left 09/28/2021   Procedure: LEFT HEEL DEBRIDEMENT AND TISSUE GRAFT;  Surgeon: Newt Minion, MD;  Location: [facility];  Service: Orthopedics;  Laterality: Left;   SHOULDER SURGERY Bilateral    SPINAL FUSION     UPPER GI ENDOSCOPY      SOCIAL HISTORY: Social History   Socioeconomic History   Marital status: Married  Spouse name: Not on file   Number of children: Not on file   Years of education: Not on file   Highest education level: Not on file  Occupational History   Not on file  Tobacco Use   Smoking status: Every Day    Packs/day: 0.50    Types: Cigarettes   Smokeless tobacco: Never  Vaping Use   Vaping Use: Never used  Substance and Sexual Activity   Alcohol use: Never   Drug use: Never   Sexual activity: Yes    Birth control/protection: Surgical    Comment: Hysterectomy  Other Topics Concern   Not on file  Social History Narrative   Not on file   Social Determinants of Health   Financial Resource Strain: Not on file  Food Insecurity: Not on file  Transportation  Needs: Not on file  Physical Activity: Not on file  Stress: Not on file  Social Connections: Not on file  Intimate Partner Violence: Not on file    FAMILY HISTORY: History reviewed. No pertinent family history.  ALLERGIES:  is allergic to amoxicillin, clarithromycin, codeine, hydrocodone, moxifloxacin, penicillins, sulfa antibiotics, nitrofurantoin, crab (diagnostic), and latex.  MEDICATIONS:  Current Outpatient Medications  Medication Sig Dispense Refill   albuterol (PROVENTIL) (2.5 MG/3ML) 0.083% nebulizer solution Take 2.5 mg by nebulization every 6 (six) hours as needed for wheezing or shortness of breath.     amitriptyline (ELAVIL) 10 MG tablet Take 10 mg by mouth 2 (two) times daily.     ARIPiprazole (ABILIFY) 5 MG tablet Take 5 mg by mouth daily.     Ascorbic Acid (VITAMIN C) 1000 MG tablet Take 1,000 mg by mouth daily.     aspirin EC 81 MG tablet Take 81 mg by mouth daily. Swallow whole.     busPIRone (BUSPAR) 15 MG tablet Take 15 mg by mouth 2 (two) times daily.     calcitRIOL (ROCALTROL) 0.25 MCG capsule Take 0.25 mcg by mouth every Monday, Wednesday, and Friday.     Cholecalciferol 25 MCG (1000 UT) tablet Take 1,000 Units by mouth daily.     cyanocobalamin (,VITAMIN B-12,) 1000 MCG/ML injection Inject 1,000 mcg into the muscle every 30 (thirty) days.     doxazosin (CARDURA) 4 MG tablet Take 4 mg by mouth daily.     DULoxetine (CYMBALTA) 60 MG capsule Take 60 mg by mouth daily.     ferrous sulfate 325 (65 FE) MG EC tablet Take 325 mg by mouth 2 (two) times daily.     fidaxomicin (DIFICID) 200 MG TABS tablet Take 1 tablet (200 mg total) by mouth 2 (two) times daily for 5 days, THEN 1 tablet (200 mg total) every other day for 20 days. 20 tablet 0   furosemide (LASIX) 80 MG tablet Take 80 mg by mouth daily.     gabapentin (NEURONTIN) 100 MG capsule Take 200 mg by mouth 2 (two) times daily.     isosorbide mononitrate (IMDUR) 30 MG 24 hr tablet Take 30 mg by mouth every morning.      mupirocin cream (BACTROBAN) 2 % APPLY 1 APPLICATION TOPICALLY DAILY 30 g 0   mupirocin ointment (BACTROBAN) 2 % Apply 1 Application topically daily. 22 g 0   omeprazole (PRILOSEC) 20 MG capsule Take 20 mg by mouth daily.     Probiotic Product (PROBIOTIC PO) Take 1 capsule by mouth daily.     telmisartan (MICARDIS) 20 MG tablet Take 20 mg by mouth daily.     zolpidem (AMBIEN) 5  MG tablet Take 5 mg by mouth at bedtime.     atorvastatin (LIPITOR) 40 MG tablet Take 40 mg by mouth at bedtime.     glimepiride (AMARYL) 2 MG tablet Take 2 mg by mouth daily before breakfast.     hydrALAZINE (APRESOLINE) 50 MG tablet Take 50 mg by mouth 2 (two) times daily.     metoprolol succinate (TOPROL-XL) 50 MG 24 hr tablet Take 50 mg by mouth daily.     No current facility-administered medications for this visit.    REVIEW OF SYSTEMS:   Review of Systems  Constitutional:  Positive for fatigue. Negative for appetite change.  Gastrointestinal:  Positive for diarrhea and nausea. Negative for blood in stool and constipation.  Genitourinary:  Positive for hematuria (x1). Negative for menstrual problem (hysterectomy).   Musculoskeletal:  Positive for arthralgias (7/10 legs) and back pain (7/10).  Psychiatric/Behavioral:  Positive for depression.   All other systems reviewed and are negative.    PHYSICAL EXAMINATION: ECOG PERFORMANCE STATUS: 1 - Symptomatic but completely ambulatory  Vitals:   11/02/21 1211  BP: (!) 173/92  Pulse: 72  Resp: 18  Temp: 98.3 F (36.8 C)  SpO2: 98%   Filed Weights   11/02/21 1211  Weight: 189 lb (85.7 kg)   Physical Exam Vitals reviewed.  Constitutional:      Appearance: Normal appearance.     Comments: In wheelchair  Cardiovascular:     Rate and Rhythm: Normal rate and regular rhythm.     Pulses: Normal pulses.     Heart sounds: Normal heart sounds.  Pulmonary:     Effort: Pulmonary effort is normal.     Breath sounds: Normal breath sounds.  Chest:      Comments: L port-a-cath Abdominal:     Palpations: Abdomen is soft. There is no mass.     Tenderness: There is no abdominal tenderness.  Musculoskeletal:     Right lower leg: 3+ Edema present.     Left lower leg: 3+ Edema present.  Lymphadenopathy:     Upper Body:     Right upper body: No supraclavicular or axillary adenopathy.     Left upper body: No supraclavicular or axillary adenopathy.     Lower Body: No right inguinal adenopathy. No left inguinal adenopathy.  Neurological:     General: No focal deficit present.     Mental Status: She is alert and oriented to person, place, and time.  Psychiatric:        Mood and Affect: Mood normal.        Behavior: Behavior normal.      LABORATORY DATA:  I have reviewed the data as listed Recent Results (from the past 2160 hour(s))  CBC     Status: Abnormal   Collection Time: 08/29/21 12:26 PM  Result Value Ref Range   WBC 23.6 (H) 4.0 - 10.5 K/uL   RBC 3.41 (L) 3.87 - 5.11 MIL/uL   Hemoglobin 8.8 (L) 12.0 - 15.0 g/dL   HCT 28.1 (L) 36.0 - 46.0 %   MCV 82.4 80.0 - 100.0 fL   MCH 25.8 (L) 26.0 - 34.0 pg   MCHC 31.3 30.0 - 36.0 g/dL   RDW 17.2 (H) 11.5 - 15.5 %   Platelets 347 150 - 400 K/uL   nRBC 0.0 0.0 - 0.2 %    Comment: Performed at [facility], [address], [place], [place] [address]  Troponin I (High Sensitivity)     Status: Abnormal   Collection  Time: 08/29/21 12:26 PM  Result Value Ref Range   Troponin I (High Sensitivity) 23 (H) <18 ng/L    Comment: (NOTE) Elevated high sensitivity troponin I (hsTnI) values and significant  changes across serial measurements may suggest ACS but many other  chronic and acute conditions are known to elevate hsTnI results.  Refer to the "Links" section for chest pain algorithms and additional  guidance. Performed at [facility], [address], [place], [place] [address]   Comprehensive metabolic panel     Status: Abnormal   Collection Time: 08/29/21 12:26 PM  Result Value Ref  Range   Sodium 143 135 - 145 mmol/L   Potassium 3.1 (L) 3.5 - 5.1 mmol/L   Chloride 83 (L) 98 - 111 mmol/L   CO2 44 (H) 22 - 32 mmol/L   Glucose, Bld 134 (H) 70 - 99 mg/dL    Comment: Glucose reference range applies only to samples taken after fasting for at least 8 hours.   BUN 45 (H) 6 - 20 mg/dL   Creatinine, Ser 3.47 (H) 0.44 - 1.00 mg/dL   Calcium 8.6 (L) 8.9 - 10.3 mg/dL   Total Protein 8.0 6.5 - 8.1 g/dL   Albumin 3.1 (L) 3.5 - 5.0 g/dL   AST 16 15 - 41 U/L   ALT 16 0 - 44 U/L   Alkaline Phosphatase 107 38 - 126 U/L   Total Bilirubin 0.5 0.3 - 1.2 mg/dL   GFR, Estimated 15 (L) >60 mL/min    Comment: (NOTE) Calculated using the CKD-EPI Creatinine Equation (2021)    Anion gap 16 (H) 5 - 15    Comment: Performed at [facility], [address], [place], [place] [address]  Lipase, blood     Status: None   Collection Time: 08/29/21 12:26 PM  Result Value Ref Range   Lipase 27 11 - 51 U/L    Comment: Performed at [facility], [address], [place], [place] [address]  Troponin I (High Sensitivity)     Status: Abnormal   Collection Time: 08/29/21  2:12 PM  Result Value Ref Range   Troponin I (High Sensitivity) 22 (H) <18 ng/L    Comment: (NOTE) Elevated high sensitivity troponin I (hsTnI) values and significant  changes across serial measurements may suggest ACS but many other  chronic and acute conditions are known to elevate hsTnI results.  Refer to the "Links" section for chest pain algorithms and additional  guidance. Performed at [facility], [address], [place], [place] [address]   Magnesium     Status: None   Collection Time: 08/29/21  2:12 PM  Result Value Ref Range   Magnesium 1.8 1.7 - 2.4 mg/dL    Comment: Performed at [facility], [address], [place], [place] [address]  CBG monitoring, ED     Status: None   Collection Time: 08/29/21 11:24 PM  Result Value Ref Range   Glucose-Capillary 74 70 - 99 mg/dL    Comment: Glucose reference range applies only  to samples taken after fasting for at least 8 hours.  Sedimentation rate     Status: Abnormal   Collection Time: 08/30/21  3:46 AM  Result Value Ref Range   Sed Rate 80 (H) 0 - 22 mm/hr    Comment: Performed at [facility], [address], [place],  [address]  C-reactive protein     Status: Abnormal   Collection Time: 08/30/21  3:46 AM  Result Value Ref Range   CRP 1.5 (  H) <1.0 mg/dL    Comment: Performed at [facility], [place] [address], [place], [place] [address]  Hemoglobin A1c     Status: Abnormal   Collection Time: 08/30/21  3:46 AM  Result Value Ref Range   Hgb A1c MFr Bld 7.3 (H) 4.8 - 5.6 %    Comment: (NOTE) Pre diabetes:          5.7%-6.4%  Diabetes:              >6.4%  Glycemic control for   <7.0% adults with diabetes    Mean Plasma Glucose 162.81 mg/dL    Comment: Performed at [facility] [address], [place], [place] [address]  HIV Antibody (routine testing w rflx)     Status: None   Collection Time: 08/30/21  3:46 AM  Result Value Ref Range   HIV Screen 4th Generation wRfx Non Reactive Non Reactive    Comment: Performed at [facility], [place] [address] [place], [place] [address]  Basic metabolic panel     Status: Abnormal   Collection Time: 08/30/21  3:46 AM  Result Value Ref Range   Sodium 139 135 - 145 mmol/L   Potassium 2.9 (L) 3.5 - 5.1 mmol/L   Chloride 91 (L) 98 - 111 mmol/L   CO2 37 (H) 22 - 32 mmol/L   Glucose, Bld 91 70 - 99 mg/dL    Comment: Glucose reference range applies only to samples taken after fasting for at least 8 hours.   BUN 42 (H) 6 - 20 mg/dL   Creatinine, Ser 3.28 (H) 0.44 - 1.00 mg/dL   Calcium 7.5 (L) 8.9 - 10.3 mg/dL   GFR, Estimated 16 (L) >60 mL/min    Comment: (NOTE) Calculated using the CKD-EPI Creatinine Equation (2021)    Anion gap 11 5 - 15    Comment: Performed at [facility], [address] [place], [place] [address]  CBC     Status: Abnormal   Collection Time: 08/30/21  3:46 AM  Result  Value Ref Range   WBC 23.4 (H) 4.0 - 10.5 K/uL   RBC 2.91 (L) 3.87 - 5.11 MIL/uL   Hemoglobin 7.1 (L) 12.0 - 15.0 g/dL   HCT 23.9 (L) 36.0 - 46.0 %   MCV 82.1 80.0 - 100.0 fL   MCH 24.4 (L) 26.0 - 34.0 pg   MCHC 29.7 (L) 30.0 - 36.0 g/dL   RDW 16.8 (H) 11.5 - 15.5 %   Platelets 269 150 - 400 K/uL   nRBC 0.0 0.0 - 0.2 %    Comment: Performed at [facility], [address] [place], [place] [address]  CBG monitoring, ED     Status: None   Collection Time: 08/30/21  3:50 AM  Result Value Ref Range   Glucose-Capillary 89 70 - 99 mg/dL    Comment: Glucose reference range applies only to samples taken after fasting for at least 8 hours.  CBG monitoring, ED     Status: None   Collection Time: 08/30/21  8:47 AM  Result Value Ref Range   Glucose-Capillary 87 70 - 99 mg/dL    Comment: Glucose reference range applies only to samples taken after fasting for at least 8 hours.  CBG monitoring, ED     Status: Abnormal   Collection Time: 08/30/21 11:43 AM  Result Value Ref Range   Glucose-Capillary 102 (H) 70 - 99 mg/dL    Comment: Glucose reference range applies only to samples  taken after fasting for at least 8 hours.  CBG monitoring, ED     Status: Abnormal   Collection Time: 08/30/21  3:35 PM  Result Value Ref Range   Glucose-Capillary 135 (H) 70 - 99 mg/dL    Comment: Glucose reference range applies only to samples taken after fasting for at least 8 hours.  Glucose, capillary     Status: Abnormal   Collection Time: 08/30/21  4:51 PM  Result Value Ref Range   Glucose-Capillary 162 (H) 70 - 99 mg/dL    Comment: Glucose reference range applies only to samples taken after fasting for at least 8 hours.  Glucose, capillary     Status: Abnormal   Collection Time: 08/30/21  8:08 PM  Result Value Ref Range   Glucose-Capillary 157 (H) 70 - 99 mg/dL    Comment: Glucose reference range applies only to samples taken after fasting for at least 8 hours.  Glucose, capillary     Status: Abnormal    Collection Time: 08/30/21 11:45 PM  Result Value Ref Range   Glucose-Capillary 118 (H) 70 - 99 mg/dL    Comment: Glucose reference range applies only to samples taken after fasting for at least 8 hours.  CBC     Status: Abnormal   Collection Time: 08/31/21  4:31 AM  Result Value Ref Range   WBC 15.4 (H) 4.0 - 10.5 K/uL   RBC 2.80 (L) 3.87 - 5.11 MIL/uL   Hemoglobin 7.2 (L) 12.0 - 15.0 g/dL   HCT 23.3 (L) 36.0 - 46.0 %   MCV 83.2 80.0 - 100.0 fL   MCH 25.7 (L) 26.0 - 34.0 pg   MCHC 30.9 30.0 - 36.0 g/dL   RDW 16.6 (H) 11.5 - 15.5 %   Platelets 254 150 - 400 K/uL   nRBC 0.0 0.0 - 0.2 %    Comment: Performed at [facility], [address], [place], [place] [address]  Basic metabolic panel     Status: Abnormal   Collection Time: 08/31/21  4:31 AM  Result Value Ref Range   Sodium 138 135 - 145 mmol/L   Potassium 3.4 (L) 3.5 - 5.1 mmol/L   Chloride 93 (L) 98 - 111 mmol/L   CO2 34 (H) 22 - 32 mmol/L   Glucose, Bld 103 (H) 70 - 99 mg/dL    Comment: Glucose reference range applies only to samples taken after fasting for at least 8 hours.   BUN 39 (H) 6 - 20 mg/dL   Creatinine, Ser 2.79 (H) 0.44 - 1.00 mg/dL   Calcium 7.6 (L) 8.9 - 10.3 mg/dL   GFR, Estimated 20 (L) >60 mL/min    Comment: (NOTE) Calculated using the CKD-EPI Creatinine Equation (2021)    Anion gap 11 5 - 15    Comment: Performed at [facility], [address] [place], [place] [address]  Glucose, capillary     Status: Abnormal   Collection Time: 08/31/21  4:43 AM  Result Value Ref Range   Glucose-Capillary 118 (H) 70 - 99 mg/dL    Comment: Glucose reference range applies only to samples taken after fasting for at least 8 hours.  Glucose, capillary     Status: Abnormal   Collection Time: 08/31/21  7:23 AM  Result Value Ref Range   Glucose-Capillary 113 (H) 70 - 99 mg/dL    Comment: Glucose reference range applies only to samples taken after fasting for at least 8 hours.  Gastrointestinal Panel by PCR , Stool  Status: None   Collection Time: 08/31/21  7:49 AM   Specimen: Stool  Result Value Ref Range   Campylobacter species NOT DETECTED NOT DETECTED   Plesimonas shigelloides NOT DETECTED NOT DETECTED   Salmonella species NOT DETECTED NOT DETECTED   Yersinia enterocolitica NOT DETECTED NOT DETECTED   Vibrio species NOT DETECTED NOT DETECTED   Vibrio cholerae NOT DETECTED NOT DETECTED   Enteroaggregative E coli (EAEC) NOT DETECTED NOT DETECTED   Enteropathogenic E coli (EPEC) NOT DETECTED NOT DETECTED   Enterotoxigenic E coli (ETEC) NOT DETECTED NOT DETECTED   Shiga like toxin producing E coli (STEC) NOT DETECTED NOT DETECTED   Shigella/Enteroinvasive E coli (EIEC) NOT DETECTED NOT DETECTED   Cryptosporidium NOT DETECTED NOT DETECTED   Cyclospora cayetanensis NOT DETECTED NOT DETECTED   Entamoeba histolytica NOT DETECTED NOT DETECTED   Giardia lamblia NOT DETECTED NOT DETECTED   Adenovirus F40/41 NOT DETECTED NOT DETECTED   Astrovirus NOT DETECTED NOT DETECTED   Norovirus GI/GII NOT DETECTED NOT DETECTED   Rotavirus A NOT DETECTED NOT DETECTED   Sapovirus (I, II, IV, and V) NOT DETECTED NOT DETECTED    Comment: Performed at [facility], [address] [place], [place] [address]  C Difficile Quick Screen w PCR reflex     Status: Abnormal   Collection Time: 08/31/21  7:49 AM   Specimen: STOOL  Result Value Ref Range   C Diff antigen POSITIVE (A) NEGATIVE   C Diff toxin NEGATIVE NEGATIVE   C Diff interpretation Results are indeterminate. See PCR results.     Comment: Performed at [facility], [address], [place], [place] [address]  C. Diff by PCR, Reflexed     Status: Abnormal   Collection Time: 08/31/21  7:49 AM  Result Value Ref Range   Toxigenic C. Difficile by PCR POSITIVE (A) NEGATIVE    Comment: Positive for toxigenic C. difficile with little to no toxin production. Only treat if clinical presentation suggests symptomatic illness. Performed at [facility],  [place] [address] [place], [place] [address]   Glucose, capillary     Status: Abnormal   Collection Time: 08/31/21 11:27 AM  Result Value Ref Range   Glucose-Capillary 140 (H) 70 - 99 mg/dL    Comment: Glucose reference range applies only to samples taken after fasting for at least 8 hours.  Glucose, capillary     Status: Abnormal   Collection Time: 08/31/21  4:58 PM  Result Value Ref Range   Glucose-Capillary 127 (H) 70 - 99 mg/dL    Comment: Glucose reference range applies only to samples taken after fasting for at least 8 hours.  Glucose, capillary     Status: Abnormal   Collection Time: 08/31/21  8:18 PM  Result Value Ref Range   Glucose-Capillary 148 (H) 70 - 99 mg/dL    Comment: Glucose reference range applies only to samples taken after fasting for at least 8 hours.  Glucose, capillary     Status: Abnormal   Collection Time: 08/31/21 11:54 PM  Result Value Ref Range   Glucose-Capillary 109 (H) 70 - 99 mg/dL    Comment: Glucose reference range applies only to samples taken after fasting for at least 8 hours.  CBC     Status: Abnormal   Collection Time: 09/01/21  3:46 AM  Result Value Ref Range   WBC 13.0 (H) 4.0 - 10.5 K/uL   RBC 2.76 (L) 3.87 - 5.11 MIL/uL   Hemoglobin 7.0 (L) 12.0 -  15.0 g/dL   HCT 23.1 (L) 36.0 - 46.0 %   MCV 83.7 80.0 - 100.0 fL   MCH 25.4 (L) 26.0 - 34.0 pg   MCHC 30.3 30.0 - 36.0 g/dL   RDW 16.6 (H) 11.5 - 15.5 %   Platelets 226 150 - 400 K/uL   nRBC 0.0 0.0 - 0.2 %    Comment: Performed at [facility], [address] [place], [place] [address]  Basic metabolic panel     Status: Abnormal   Collection Time: 09/01/21  3:46 AM  Result Value Ref Range   Sodium 137 135 - 145 mmol/L   Potassium 4.0 3.5 - 5.1 mmol/L   Chloride 95 (L) 98 - 111 mmol/L   CO2 35 (H) 22 - 32 mmol/L   Glucose, Bld 108 (H) 70 - 99 mg/dL    Comment: Glucose reference range applies only to samples taken after fasting for at least 8 hours.   BUN 32 (H) 6 - 20 mg/dL   Creatinine,  Ser 2.40 (H) 0.44 - 1.00 mg/dL   Calcium 7.9 (L) 8.9 - 10.3 mg/dL   GFR, Estimated 23 (L) >60 mL/min    Comment: (NOTE) Calculated using the CKD-EPI Creatinine Equation (2021)    Anion gap 7 5 - 15    Comment: Performed at [facility], [address], [place], [place] [address]  Folate     Status: None   Collection Time: 09/01/21  3:46 AM  Result Value Ref Range   Folate 20.5 >5.9 ng/mL    Comment: Performed at [facility], [address] [place], [place] [address]  Iron and TIBC     Status: Abnormal   Collection Time: 09/01/21  3:46 AM  Result Value Ref Range   Iron 28 28 - 170 ug/dL   TIBC 244 (L) 250 - 450 ug/dL   Saturation Ratios 11 10.4 - 31.8 %   UIBC 216 ug/dL    Comment: Performed at [facility], [address], [place], [place] [address]  Ferritin     Status: None   Collection Time: 09/01/21  3:46 AM  Result Value Ref Range   Ferritin 103 11 - 307 ng/mL    Comment: Performed at [facility], [address], [place], [place] [address]  Vitamin B12     Status: Abnormal   Collection Time: 09/01/21  3:46 AM  Result Value Ref Range   Vitamin B-12 1,079 (H) 180 - 914 pg/mL    Comment: (NOTE) This assay is not validated for testing neonatal or myeloproliferative syndrome specimens for Vitamin B12 levels. Performed at [facility], [address] [place], [place] [address]   Glucose, capillary     Status: Abnormal   Collection Time: 09/01/21  4:29 AM  Result Value Ref Range   Glucose-Capillary 105 (H) 70 - 99 mg/dL    Comment: Glucose reference range applies only to samples taken after fasting for at least 8 hours.  ABO/Rh     Status: None   Collection Time: 09/01/21  4:33 AM  Result Value Ref Range   ABO/RH(D)      O POS Performed at [facility], [address] [place],  [address]   Glucose, capillary     Status: Abnormal   Collection Time: 09/01/21  8:12 AM  Result Value Ref Range   Glucose-Capillary 110 (H) 70 - 99 mg/dL    Comment: Glucose reference  range applies only to samples taken after fasting for at least 8 hours.  Prepare RBC (crossmatch)     Status: None   Collection Time: 09/01/21  9:52 AM  Result Value Ref Range   Order Confirmation      ORDER PROCESSED BY BLOOD BANK Performed at [facility], [address] [place], [place] [address]   Type and screen [facility]     Status: None   Collection Time: 09/01/21  9:52 AM  Result Value Ref Range   ABO/RH(D) O POS    Antibody Screen NEG    Sample Expiration 09/04/2021,2359    Unit Number [id number]    Blood Component Type RED CELLS,LR    Unit division 00    Status of Unit ISSUED,FINAL    Transfusion Status OK TO TRANSFUSE    Crossmatch Result      Compatible Performed at [facility], [address] [place], [place] [address]   BPAM RBC     Status: None   Collection Time: 09/01/21  9:52 AM  Result Value Ref Range   ISSUE DATE / TIME [id number]    Blood Product Unit Number [id number]    PRODUCT CODE [id number]    Unit Type and Rh 5100    Blood Product Expiration Date [id number]   Glucose, capillary     Status: Abnormal   Collection Time: 09/01/21 11:46 AM  Result Value Ref Range   Glucose-Capillary 129 (H) 70 - 99 mg/dL    Comment: Glucose reference range applies only to samples taken after fasting for at least 8 hours.  Glucose, capillary     Status: Abnormal   Collection Time: 09/28/21  8:24 AM  Result Value Ref Range   Glucose-Capillary 111 (H) 70 - 99 mg/dL    Comment: Glucose reference range applies only to samples taken after fasting for at least 8 hours.  Basic metabolic panel per protocol     Status: Abnormal   Collection Time: 09/28/21  8:40 AM  Result Value Ref Range   Sodium 139 135 - 145 mmol/L   Potassium 4.1 3.5 - 5.1 mmol/L   Chloride 109 98 - 111 mmol/L   CO2 20 (L) 22 - 32 mmol/L   Glucose, Bld 99 70 - 99 mg/dL    Comment: Glucose reference range applies only to samples taken after fasting for at least 8 hours.   BUN 29 (H) 6  - 20 mg/dL   Creatinine, Ser 2.45 (H) 0.44 - 1.00 mg/dL   Calcium 8.5 (L) 8.9 - 10.3 mg/dL   GFR, Estimated 23 (L) >60 mL/min    Comment: (NOTE) Calculated using the CKD-EPI Creatinine Equation (2021)    Anion gap 10 5 - 15    Comment: Performed at [facility] [address], [place], [place] [address]  CBC per protocol     Status: Abnormal   Collection Time: 09/28/21  8:40 AM  Result Value Ref Range   WBC 12.1 (H) 4.0 - 10.5 K/uL   RBC 3.10 (L) 3.87 - 5.11 MIL/uL   Hemoglobin 8.5 (L) 12.0 - 15.0 g/dL   HCT 27.3 (L) 36.0 - 46.0 %   MCV 88.1 80.0 - 100.0 fL   MCH 27.4 26.0 - 34.0 pg   MCHC 31.1 30.0 - 36.0 g/dL   RDW 19.7 (H) 11.5 - 15.5 %   Platelets 253 150 - 400 K/uL   nRBC 0.0 0.0 - 0.2 %    Comment: Performed at [facility], [place] [address], [place], [place] [address]  Glucose, capillary  Status: Abnormal   Collection Time: 09/28/21 10:38 AM  Result Value Ref Range   Glucose-Capillary 123 (H) 70 - 99 mg/dL    Comment: Glucose reference range applies only to samples taken after fasting for at least 8 hours.  Glucose, capillary     Status: Abnormal   Collection Time: 09/28/21 11:49 AM  Result Value Ref Range   Glucose-Capillary 130 (H) 70 - 99 mg/dL    Comment: Glucose reference range applies only to samples taken after fasting for at least 8 hours.  Sample to Blood Bank(Blood Bank Hold)     Status: None   Collection Time: 11/02/21 12:57 PM  Result Value Ref Range   Blood Bank Specimen SAMPLE AVAILABLE FOR TESTING    Sample Expiration      11/03/2021,2359 Performed at [facility], [address], [place], [place] [address]   Direct antiglobulin test     Status: None (Preliminary result)   Collection Time: 11/02/21 12:57 PM  Result Value Ref Range   DAT, complement PENDING    DAT, IgG      NEG Performed at [facility], [address] [place], [place] [address]   Lactate dehydrogenase     Status: Abnormal   Collection Time: 11/02/21 12:57 PM   Result Value Ref Range   LDH 201 (H) 98 - 192 U/L    Comment: Performed at [facility], [address], [place], [place] [address]  Reticulocytes     Status: Abnormal   Collection Time: 11/02/21 12:57 PM  Result Value Ref Range   Retic Ct Pct 1.9 0.4 - 3.1 %   RBC. 2.55 (L) 3.87 - 5.11 MIL/uL   Retic Count, Absolute 49.2 19.0 - 186.0 K/uL   Immature Retic Fract 11.4 2.3 - 15.9 %    Comment: Performed at [facility], [address], [place], [place] [address]  CBC with Differential     Status: Abnormal   Collection Time: 11/02/21 12:57 PM  Result Value Ref Range   WBC 11.6 (H) 4.0 - 10.5 K/uL   RBC 2.54 (L) 3.87 - 5.11 MIL/uL   Hemoglobin 7.2 (L) 12.0 - 15.0 g/dL   HCT 23.6 (L) 36.0 - 46.0 %   MCV 92.9 80.0 - 100.0 fL   MCH 28.3 26.0 - 34.0 pg   MCHC 30.5 30.0 - 36.0 g/dL   RDW 18.6 (H) 11.5 - 15.5 %   Platelets 214 150 - 400 K/uL   nRBC 0.0 0.0 - 0.2 %   Neutrophils Relative % 84 %   Neutro Abs 9.7 (H) 1.7 - 7.7 K/uL   Lymphocytes Relative 7 %   Lymphs Abs 0.8 0.7 - 4.0 K/uL   Monocytes Relative 7 %   Monocytes Absolute 0.8 0.1 - 1.0 K/uL   Eosinophils Relative 2 %   Eosinophils Absolute 0.2 0.0 - 0.5 K/uL   Basophils Relative 0 %   Basophils Absolute 0.1 0.0 - 0.1 K/uL   Immature Granulocytes 0 %   Abs Immature Granulocytes 0.05 0.00 - 0.07 K/uL    Comment: Performed at [facility], [address], [place], [place] [address]    RADIOGRAPHIC STUDIES: I have personally reviewed the radiological images as listed and agreed with the findings in the report. No results found.  ASSESSMENT:  Normocytic anemia: - Etiology: CKD and relative iron deficiency. - Has been taking iron tablet twice daily for 2 months. - Denies BRBPR/melena.  Had TAH at 26 secondary to bleeding.  She had 3 blood transfusions  in the last 6 months.  Never had parenteral iron therapy. 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  She is trying to find a neurologist  locally. - She had melanoma resected from left leg and left breast in 1993.   Social/family history: - She is seen with her husband today.  She moved to [place] from [facility].  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  Current active smoker half pack per day for 42 years. - Sister died of lung cancer.  Mother died of breast cancer.  Father had prostate cancer.  Maternal grandfather had pancreatic cancer.   PLAN:  Normocytic anemia: - Patient seen at the request of Dr. Theador Hawthorne for anemia secondary to CKD and relative iron deficiency. - She reports that she is feeling very weak lately.  We will repeat her CBC today along with a blood sample to see if she needs transfusion.  We will also check hemolysis labs. - We talked about parenteral iron therapy with Feraheme x2. - As she had many allergies to medications, I have recommended premedication with steroids, Benadryl, Pepcid. - Recommend RTC 6 weeks with repeat labs. - If there is no significant improvement in her hemoglobin about 10 g, will consider adding erythropoiesis stimulating agents.   All questions were answered. The patient knows to call the clinic with any problems, questions or concerns.  Derek Jack, MD 11/02/21 2:18 PM  [place] [phone]   I, Thana Ates, am acting as a scribe for Dr. Derek Jack.  I, Derek Jack MD, have reviewed the above documentation for accuracy and completeness, and I agree with the above.

## 2021-11-02 NOTE — Patient Instructions (Signed)
[place] at [facility] Discharge Instructions  You were seen and examined today by Dr. Delton Coombes. Dr. Delton Coombes is a hematologist, meaning that he specializes in blood abnormalities. Dr. Delton Coombes discussed your past medical history, family history of cancers/blood conditions and the events that led to you being here today.  You were referred to Dr. Delton Coombes due to iron deficiency anemia. Dr. Delton Coombes would like to draw labs today in the event that you need blood. Dr. Delton Coombes has also recommended iron infusions.  Follow-up as scheduled after the iron infusions and have your labs rechecked prior.   Thank you for choosing [place] at [facility] to provide your oncology and hematology care.  To afford each patient quality time with our provider, please arrive at least 15 minutes before your scheduled appointment time.   If you have a lab appointment with the [facility] please come in thru the Main Entrance and check in at the main information desk.  You need to re-schedule your appointment should you arrive 10 or more minutes late.  We strive to give you quality time with our providers, and arriving late affects you and other patients whose appointments are after yours.  Also, if you no show three or more times for appointments you may be dismissed from the clinic at the providers discretion.     Again, thank you for choosing [facility].  Our hope is that these requests will decrease the amount of time that you wait before being seen by our physicians.       _____________________________________________________________  Should you have questions after your visit to [facility], please contact our office at [phone] and follow the prompts.  Our office hours are 8:00 a.m. and 4:30 p.m. Monday - Friday.  Please note that voicemails left after 4:00 p.m. may not be returned until the following business day.   We are closed weekends and major holidays.  You do have access to a nurse 24-7, just call the main number to the clinic [phone] and do not press any options, hold on the line and a nurse will answer the phone.    For prescription refill requests, have your pharmacy contact our office and allow 72 hours.

## 2021-11-03 LAB — HAPTOGLOBIN: Haptoglobin: 164 mg/dL (ref 33–346)

## 2021-11-07 ENCOUNTER — Inpatient Hospital Stay (HOSPITAL_COMMUNITY): Payer: 59

## 2021-11-07 VITALS — BP 161/81 | HR 69 | Temp 98.6°F | Resp 18

## 2021-11-07 DIAGNOSIS — D509 Iron deficiency anemia, unspecified: Secondary | ICD-10-CM

## 2021-11-07 DIAGNOSIS — D649 Anemia, unspecified: Secondary | ICD-10-CM | POA: Diagnosis not present

## 2021-11-07 MED ORDER — SODIUM CHLORIDE 0.9 % IV SOLN: Freq: Once | INTRAVENOUS | Status: AC

## 2021-11-07 MED ORDER — METHYLPREDNISOLONE SODIUM SUCC 125 MG IJ SOLR
125.0000 mg | Freq: Once | INTRAMUSCULAR | Status: AC
  Administered 2021-11-07: 125 mg via INTRAVENOUS
  Filled 2021-11-07: qty 2

## 2021-11-07 MED ORDER — SODIUM CHLORIDE 0.9 % IV SOLN
510.0000 mg | Freq: Once | INTRAVENOUS | Status: AC
  Administered 2021-11-07: 510 mg via INTRAVENOUS
  Filled 2021-11-07: qty 510

## 2021-11-07 MED ORDER — HEPARIN SOD (PORK) LOCK FLUSH 100 UNIT/ML IV SOLN
500.0000 [IU] | Freq: Once | INTRAVENOUS | Status: AC
  Administered 2021-11-07: 500 [IU] via INTRAVENOUS

## 2021-11-07 MED ORDER — ACETAMINOPHEN 325 MG PO TABS
650.0000 mg | ORAL_TABLET | Freq: Once | ORAL | Status: AC
  Administered 2021-11-07: 650 mg via ORAL
  Filled 2021-11-07: qty 2

## 2021-11-07 MED ORDER — FAMOTIDINE IN NACL 20-0.9 MG/50ML-% IV SOLN
20.0000 mg | Freq: Once | INTRAVENOUS | Status: AC
  Administered 2021-11-07: 20 mg via INTRAVENOUS
  Filled 2021-11-07: qty 50

## 2021-11-07 MED ORDER — SODIUM CHLORIDE 0.9% FLUSH
10.0000 mL | Freq: Once | INTRAVENOUS | Status: AC
  Administered 2021-11-07: 10 mL via INTRAVENOUS

## 2021-11-07 MED ORDER — DIPHENHYDRAMINE HCL 50 MG/ML IJ SOLN
50.0000 mg | Freq: Once | INTRAMUSCULAR | Status: AC
  Administered 2021-11-07: 50 mg via INTRAVENOUS
  Filled 2021-11-07: qty 1

## 2021-11-07 NOTE — Progress Notes (Signed)
Patient presents today for Feraheme infusion per providers order.  Vital signs WNL.  Patient has no new complaints at this time.   Feraheme given today per MD orders.  Stable during infusion without adverse affects.  Vital signs stable.  No complaints at this time.  Discharge from clinic ambulatory in stable condition.  Alert and oriented X 3.  Follow up with [facility] as scheduled.

## 2021-11-07 NOTE — Patient Instructions (Signed)
[facility]  Discharge Instructions: Thank you for choosing [facility] to provide your oncology and hematology care.  If you have a lab appointment with the Cancer Center, please come in thru the Main Entrance and check in at the main information desk.  Wear comfortable clothing and clothing appropriate for easy access to any Portacath or PICC line.   We strive to give you quality time with your provider. You may need to reschedule your appointment if you arrive late (15 or more minutes).  Arriving late affects you and other patients whose appointments are after yours.  Also, if you miss three or more appointments without notifying the office, you may be dismissed from the clinic at the provider's discretion.      For prescription refill requests, have your pharmacy contact our office and allow 72 hours for refills to be completed.    Today you received the following chemotherapy and/or immunotherapy agents Feraheme      To help prevent nausea and vomiting after your treatment, we encourage you to take your nausea medication as directed.  BELOW ARE SYMPTOMS THAT SHOULD BE REPORTED IMMEDIATELY: *FEVER GREATER THAN 100.4 F (38 C) OR HIGHER *CHILLS OR SWEATING *NAUSEA AND VOMITING THAT IS NOT CONTROLLED WITH YOUR NAUSEA MEDICATION *UNUSUAL SHORTNESS OF BREATH *UNUSUAL BRUISING OR BLEEDING *URINARY PROBLEMS (pain or burning when urinating, or frequent urination) *BOWEL PROBLEMS (unusual diarrhea, constipation, pain near the anus) TENDERNESS IN MOUTH AND THROAT WITH OR WITHOUT PRESENCE OF ULCERS (sore throat, sores in mouth, or a toothache) UNUSUAL RASH, SWELLING OR PAIN  UNUSUAL VAGINAL DISCHARGE OR ITCHING   Items with * indicate a potential emergency and should be followed up as soon as possible or go to the Emergency Department if any problems should occur.  Please show the CHEMOTHERAPY ALERT CARD or IMMUNOTHERAPY ALERT CARD at check-in to the Emergency  Department and triage nurse.  Should you have questions after your visit or need to cancel or reschedule your appointment, please contact [facility] [phone]  and follow the prompts.  Office hours are 8:00 a.m. to 4:30 p.m. Monday - Friday. Please note that voicemails left after 4:00 p.m. may not be returned until the following business day.  We are closed weekends and major holidays. You have access to a nurse at all times for urgent questions. Please call the main number to the clinic [phone] and follow the prompts.  For any non-urgent questions, you may also contact your provider using MyChart. We now offer e-Visits for anyone 18 and older to request care online for non-urgent symptoms. For details visit [email or web address].   Also download the MyChart app! Go to the app store, search "MyChart", open the app, select [facility], and log in with your MyChart username and password.  Masks are optional in the cancer centers. If you would like for your care team to wear a mask while they are taking care of you, please let them know. For doctor visits, patients may have with them one support person who is at least 55 years old. At this time, visitors are not allowed in the infusion area.  

## 2021-11-09 ENCOUNTER — Other Ambulatory Visit (HOSPITAL_COMMUNITY): Payer: Self-pay | Admitting: Nephrology

## 2021-11-09 ENCOUNTER — Other Ambulatory Visit: Payer: Self-pay | Admitting: Nephrology

## 2021-11-09 DIAGNOSIS — N170 Acute kidney failure with tubular necrosis: Secondary | ICD-10-CM

## 2021-11-09 DIAGNOSIS — D638 Anemia in other chronic diseases classified elsewhere: Secondary | ICD-10-CM

## 2021-11-09 DIAGNOSIS — E1122 Type 2 diabetes mellitus with diabetic chronic kidney disease: Secondary | ICD-10-CM

## 2021-11-14 ENCOUNTER — Inpatient Hospital Stay: Payer: 59 | Attending: Hematology

## 2021-11-14 VITALS — BP 161/75 | HR 68 | Temp 98.4°F | Resp 18

## 2021-11-14 DIAGNOSIS — N184 Chronic kidney disease, stage 4 (severe): Secondary | ICD-10-CM | POA: Insufficient documentation

## 2021-11-14 DIAGNOSIS — D631 Anemia in chronic kidney disease: Secondary | ICD-10-CM | POA: Insufficient documentation

## 2021-11-14 DIAGNOSIS — D509 Iron deficiency anemia, unspecified: Secondary | ICD-10-CM | POA: Diagnosis not present

## 2021-11-14 MED ORDER — DIPHENHYDRAMINE HCL 50 MG/ML IJ SOLN
50.0000 mg | Freq: Once | INTRAMUSCULAR | Status: AC
  Administered 2021-11-14: 50 mg via INTRAVENOUS
  Filled 2021-11-14: qty 1

## 2021-11-14 MED ORDER — SODIUM CHLORIDE 0.9% FLUSH
10.0000 mL | INTRAVENOUS | Status: DC | PRN
  Administered 2021-11-14: 10 mL via INTRAVENOUS

## 2021-11-14 MED ORDER — SODIUM CHLORIDE 0.9 % IV SOLN: Freq: Once | INTRAVENOUS | Status: AC

## 2021-11-14 MED ORDER — HEPARIN SOD (PORK) LOCK FLUSH 100 UNIT/ML IV SOLN
500.0000 [IU] | Freq: Once | INTRAVENOUS | Status: AC
  Administered 2021-11-14: 500 [IU] via INTRAVENOUS

## 2021-11-14 MED ORDER — FAMOTIDINE IN NACL 20-0.9 MG/50ML-% IV SOLN
20.0000 mg | Freq: Once | INTRAVENOUS | Status: AC
  Administered 2021-11-14: 20 mg via INTRAVENOUS
  Filled 2021-11-14: qty 50

## 2021-11-14 MED ORDER — METHYLPREDNISOLONE SODIUM SUCC 125 MG IJ SOLR
125.0000 mg | Freq: Once | INTRAMUSCULAR | Status: AC
  Administered 2021-11-14: 125 mg via INTRAVENOUS
  Filled 2021-11-14: qty 2

## 2021-11-14 MED ORDER — SODIUM CHLORIDE 0.9 % IV SOLN
510.0000 mg | Freq: Once | INTRAVENOUS | Status: AC
  Administered 2021-11-14: 510 mg via INTRAVENOUS
  Filled 2021-11-14: qty 510

## 2021-11-14 MED ORDER — ONDANSETRON HCL 4 MG PO TABS
8.0000 mg | ORAL_TABLET | Freq: Once | ORAL | Status: AC
  Administered 2021-11-14: 8 mg via ORAL
  Filled 2021-11-14: qty 2

## 2021-11-14 MED ORDER — ACETAMINOPHEN 325 MG PO TABS
650.0000 mg | ORAL_TABLET | Freq: Once | ORAL | Status: AC
  Administered 2021-11-14: 650 mg via ORAL
  Filled 2021-11-14: qty 2

## 2021-11-14 NOTE — Patient Instructions (Signed)
[facility]  Discharge Instructions: Thank you for choosing [facility] to provide your oncology and hematology care.  If you have a lab appointment with the [facility], please come in thru the Main Entrance and check in at the main information desk.  Wear comfortable clothing and clothing appropriate for easy access to any Portacath or PICC line.   We strive to give you quality time with your provider. You may need to reschedule your appointment if you arrive late (15 or more minutes).  Arriving late affects you and other patients whose appointments are after yours.  Also, if you miss three or more appointments without notifying the office, you may be dismissed from the clinic at the provider's discretion.      For prescription refill requests, have your pharmacy contact our office and allow 72 hours for refills to be completed.    Today you received the following chemotherapy and/or immunotherapy agents Feraheme IV iron.   To help prevent nausea and vomiting after your treatment, we encourage you to take your nausea medication as directed.  BELOW ARE SYMPTOMS THAT SHOULD BE REPORTED IMMEDIATELY: *FEVER GREATER THAN 100.4 F (38 C) OR HIGHER *CHILLS OR SWEATING *NAUSEA AND VOMITING THAT IS NOT CONTROLLED WITH YOUR NAUSEA MEDICATION *UNUSUAL SHORTNESS OF BREATH *UNUSUAL BRUISING OR BLEEDING *URINARY PROBLEMS (pain or burning when urinating, or frequent urination) *BOWEL PROBLEMS (unusual diarrhea, constipation, pain near the anus) TENDERNESS IN MOUTH AND THROAT WITH OR WITHOUT PRESENCE OF ULCERS (sore throat, sores in mouth, or a toothache) UNUSUAL RASH, SWELLING OR PAIN  UNUSUAL VAGINAL DISCHARGE OR ITCHING   Items with * indicate a potential emergency and should be followed up as soon as possible or go to the Emergency Department if any problems should occur.  Please show the CHEMOTHERAPY ALERT CARD or IMMUNOTHERAPY ALERT CARD at check-in to the  Emergency Department and triage nurse.  Should you have questions after your visit or need to cancel or reschedule your appointment, please contact [facility] [phone]  and follow the prompts.  Office hours are 8:00 a.m. to 4:30 p.m. Monday - Friday. Please note that voicemails left after 4:00 p.m. may not be returned until the following business day.  We are closed weekends and major holidays. You have access to a nurse at all times for urgent questions. Please call the main number to the clinic [phone] and follow the prompts.  For any non-urgent questions, you may also contact your provider using MyChart. We now offer e-Visits for anyone 65 and older to request care online for non-urgent symptoms. For details visit [email or web address].   Also download the MyChart app! Go to the app store, search "MyChart", open the app, select [facility], and log in with your MyChart username and password.  Masks are optional in the cancer centers. If you would like for your care team to wear a mask while they are taking care of you, please let them know. For doctor visits, patients may have with them one support person who is at least 55 years old. At this time, visitors are not allowed in the infusion area.

## 2021-11-14 NOTE — Progress Notes (Signed)
Pt presents today for Feraheme IV iron infusion per provider's order. Pt c/o nausea after last iron infusion. Message sent to Dr.K and he stated to give 8mg  Zofran p.o x 1 dose.  Feraheme given today per MD orders. Tolerated infusion without adverse affects. Vital signs stable. No complaints at this time. Discharged from clinic ambulatory in stable condition. Alert and oriented x 3. F/U with [facility] as scheduled.

## 2021-11-16 ENCOUNTER — Encounter (HOSPITAL_COMMUNITY): Payer: Self-pay | Admitting: Hematology

## 2021-11-19 ENCOUNTER — Other Ambulatory Visit: Payer: Self-pay | Admitting: Radiology

## 2021-11-19 DIAGNOSIS — N178 Other acute kidney failure: Secondary | ICD-10-CM

## 2021-11-20 ENCOUNTER — Encounter (HOSPITAL_COMMUNITY): Payer: Self-pay

## 2021-11-20 ENCOUNTER — Ambulatory Visit (HOSPITAL_COMMUNITY)
Admission: RE | Admit: 2021-11-20 | Discharge: 2021-11-20 | Disposition: A | Discharge status: Home / Self Care | Payer: 59 | Source: Ambulatory Visit | Attending: Nephrology | Admitting: Nephrology

## 2021-11-20 DIAGNOSIS — E1122 Type 2 diabetes mellitus with diabetic chronic kidney disease: Secondary | ICD-10-CM | POA: Insufficient documentation

## 2021-11-20 DIAGNOSIS — R808 Other proteinuria: Secondary | ICD-10-CM | POA: Diagnosis not present

## 2021-11-20 DIAGNOSIS — N189 Chronic kidney disease, unspecified: Secondary | ICD-10-CM | POA: Diagnosis not present

## 2021-11-20 DIAGNOSIS — D638 Anemia in other chronic diseases classified elsewhere: Secondary | ICD-10-CM | POA: Insufficient documentation

## 2021-11-20 DIAGNOSIS — N170 Acute kidney failure with tubular necrosis: Secondary | ICD-10-CM | POA: Diagnosis not present

## 2021-11-20 DIAGNOSIS — G6181 Chronic inflammatory demyelinating polyneuritis: Secondary | ICD-10-CM | POA: Insufficient documentation

## 2021-11-20 DIAGNOSIS — D509 Iron deficiency anemia, unspecified: Secondary | ICD-10-CM | POA: Diagnosis not present

## 2021-11-20 DIAGNOSIS — E871 Hypo-osmolality and hyponatremia: Secondary | ICD-10-CM | POA: Insufficient documentation

## 2021-11-20 DIAGNOSIS — N184 Chronic kidney disease, stage 4 (severe): Secondary | ICD-10-CM | POA: Diagnosis not present

## 2021-11-20 DIAGNOSIS — I5042 Chronic combined systolic (congestive) and diastolic (congestive) heart failure: Secondary | ICD-10-CM | POA: Diagnosis not present

## 2021-11-20 LAB — GLUCOSE, CAPILLARY
Glucose-Capillary: 163 mg/dL — ABNORMAL HIGH (ref 70–99)
Glucose-Capillary: 198 mg/dL — ABNORMAL HIGH (ref 70–99)

## 2021-11-20 LAB — PROTIME-INR
INR: 1.2 (ref 0.8–1.2)
Prothrombin Time: 15 seconds (ref 11.4–15.2)

## 2021-11-20 LAB — CBC
HCT: 22.1 % — ABNORMAL LOW (ref 36.0–46.0)
Hemoglobin: 7 g/dL — ABNORMAL LOW (ref 12.0–15.0)
MCH: 30.7 pg (ref 26.0–34.0)
MCHC: 31.7 g/dL (ref 30.0–36.0)
MCV: 96.9 fL (ref 80.0–100.0)
Platelets: 199 10*3/uL (ref 150–400)
RBC: 2.28 MIL/uL — ABNORMAL LOW (ref 3.87–5.11)
RDW: 18.3 % — ABNORMAL HIGH (ref 11.5–15.5)
WBC: 11.9 10*3/uL — ABNORMAL HIGH (ref 4.0–10.5)
nRBC: 0 % (ref 0.0–0.2)

## 2021-11-20 MED ORDER — MIDAZOLAM HCL 2 MG/2ML IJ SOLN
INTRAMUSCULAR | Status: AC
  Filled 2021-11-20: qty 2

## 2021-11-20 MED ORDER — HEPARIN SOD (PORK) LOCK FLUSH 100 UNIT/ML IV SOLN
500.0000 [IU] | INTRAVENOUS | Status: AC | PRN
  Administered 2021-11-20: 500 [IU]

## 2021-11-20 MED ORDER — HYDRALAZINE HCL 20 MG/ML IJ SOLN
INTRAMUSCULAR | Status: AC
  Filled 2021-11-20: qty 1

## 2021-11-20 MED ORDER — MIDAZOLAM HCL 2 MG/2ML IJ SOLN
INTRAMUSCULAR | Status: AC | PRN
  Administered 2021-11-20: 1 mg via INTRAVENOUS

## 2021-11-20 MED ORDER — GELATIN ABSORBABLE 12-7 MM EX MISC
CUTANEOUS | Status: AC
  Filled 2021-11-20: qty 1

## 2021-11-20 MED ORDER — FENTANYL CITRATE (PF) 100 MCG/2ML IJ SOLN
INTRAMUSCULAR | Status: AC | PRN
  Administered 2021-11-20: 50 ug via INTRAVENOUS

## 2021-11-20 MED ORDER — SODIUM CHLORIDE 0.9 % IV SOLN: INTRAVENOUS | Status: DC

## 2021-11-20 MED ORDER — METOPROLOL SUCCINATE ER 50 MG PO TB24
50.0000 mg | ORAL_TABLET | ORAL | Status: AC
Start: 2021-11-20 — End: 2021-11-20
  Administered 2021-11-20: 50 mg via ORAL
  Filled 2021-11-20: qty 1

## 2021-11-20 MED ORDER — HYDRALAZINE HCL 50 MG PO TABS
50.0000 mg | ORAL_TABLET | ORAL | Status: AC
  Administered 2021-11-20: 50 mg via ORAL
  Filled 2021-11-20: qty 1

## 2021-11-20 MED ORDER — FENTANYL CITRATE (PF) 100 MCG/2ML IJ SOLN
INTRAMUSCULAR | Status: AC
  Filled 2021-11-20: qty 2

## 2021-11-20 MED ORDER — LIDOCAINE HCL (PF) 1 % IJ SOLN
INTRAMUSCULAR | Status: AC
  Filled 2021-11-20: qty 30

## 2021-11-20 NOTE — H&P (Signed)
Chief Complaint: Patient was seen in consultation today for random renal biopsy at the request of [place] S  Referring Physician(s): [place] Physician: Mir, Biochemist, clinical  Patient Status: [facility] - Out-pt  History of Present Illness: Tracy Walsh is a 56 y.o. female   Hx Chronic inflammatory demyelinating polyneuropathy (Last IVIG infusion 1 yr ago - has [place] cath) Normocytic anemia-- iron deficiency Melanoma Hx chronic kidney disease stage 4 Diabetes Follows with Dr Theador Hawthorne Uses ASA/Plavix-- off x 2 weeks  Scheduled today random renal biopsy   Past Medical History:  Diagnosis Date   Anemia    Anxiety    Asthma    Bipolar disorder ([place]) 2018   type 2   CHF (congestive heart failure) (HCC)    CIDP (chronic inflammatory demyelinating polyneuropathy) (HCC)    CKD (chronic kidney disease)    COPD (chronic obstructive pulmonary disease) ([place])    Depression    Diabetes mellitus without complication (HCC)    type 2   Diabetic foot ulcers (HCC)    Fibromyalgia    GERD (gastroesophageal reflux disease)    Hepatitis    Hep as a child - patient can give blood   History of blood transfusion 08/2021   1 unit per patient   History of esophagogastroduodenoscopy (EGD) 2018   History of kidney stones    passed stones   HLD (hyperlipidemia)    Hypertension    Migraines    Myocardial infarction ([place])    x 2   Neuropathy    legs and feet - bilateral    Past Surgical History:  Procedure Laterality Date   ABDOMINAL HYSTERECTOMY     APPENDECTOMY     BILATERAL NASAL FRACTURE CLOSED REDUCTION  10/2020   CHOLECYSTECTOMY     COLONOSCOPY     CORONARY ANGIOPLASTY WITH STENT PLACEMENT  01/15/2021   FOOT SURGERY Right    I & D   I & D EXTREMITY Left 09/28/2021   Procedure: LEFT HEEL DEBRIDEMENT AND TISSUE GRAFT;  Surgeon: Newt Minion, MD;  Location: [place];  Service: Orthopedics;  Laterality: Left;   SHOULDER SURGERY Bilateral    SPINAL FUSION      UPPER GI ENDOSCOPY      Allergies: Amoxicillin, Clarithromycin, Codeine, Hydrocodone, Moxifloxacin, Penicillins, Sulfa antibiotics, Nitrofurantoin, Crab (diagnostic), and Latex  Medications: Prior to Admission medications   Medication Sig Start Date End Date Taking? Authorizing Provider  albuterol (PROVENTIL) (2.5 MG/3ML) 0.083% nebulizer solution Take 2.5 mg by nebulization every 6 (six) hours as needed for wheezing or shortness of breath. 05/08/21  Yes [provider]  amitriptyline (ELAVIL) 10 MG tablet Take 10 mg by mouth 2 (two) times daily. 08/03/21  Yes [provider]  ARIPiprazole (ABILIFY) 5 MG tablet Take 5 mg by mouth daily. 12/14/19  Yes [provider]  Ascorbic Acid (VITAMIN C) 1000 MG tablet Take 1,000 mg by mouth daily.   Yes [provider]  atorvastatin (LIPITOR) 40 MG tablet Take 40 mg by mouth at bedtime. 08/03/21  Yes [provider]  busPIRone (BUSPAR) 15 MG tablet Take 15 mg by mouth 2 (two) times daily. 09/18/20  Yes [provider]  calcitRIOL (ROCALTROL) 0.25 MCG capsule Take 0.25 mcg by mouth every Monday, Wednesday, and Friday. 09/12/21  Yes [provider]  Cholecalciferol 25 MCG (1000 UT) tablet Take 1,000 Units by mouth daily. 05/01/21  Yes [provider]  cyanocobalamin (,VITAMIN B-12,) 1000 MCG/ML injection Inject 1,000 mcg into the  muscle every 30 (thirty) days. 09/17/21  Yes [provider]  doxazosin (CARDURA) 4 MG tablet Take 4 mg by mouth daily. 08/20/21  Yes [provider]  DULoxetine (CYMBALTA) 60 MG capsule Take 60 mg by mouth daily. 09/18/20  Yes [provider]  ferrous sulfate 325 (65 FE) MG EC tablet Take 325 mg by mouth 2 (two) times daily. 02/19/21  Yes [provider]  furosemide (LASIX) 80 MG tablet Take 80 mg by mouth daily. 08/09/21 08/22/22 Yes [provider]  gabapentin (NEURONTIN) 100 MG capsule Take 200 mg by mouth 2 (two) times  daily. 08/21/21  Yes [provider]  glimepiride (AMARYL) 2 MG tablet Take 2 mg by mouth daily before breakfast. 08/03/21  Yes [provider]  hydrALAZINE (APRESOLINE) 50 MG tablet Take 50 mg by mouth 2 (two) times daily. 08/08/21  Yes [provider]  isosorbide mononitrate (IMDUR) 30 MG 24 hr tablet Take 30 mg by mouth every morning. 05/04/21  Yes [provider]  metoprolol succinate (TOPROL-XL) 50 MG 24 hr tablet Take 50 mg by mouth daily. 08/09/21  Yes [provider]  Multiple Vitamins-Minerals (HAIR/SKIN/NAILS/BIOTIN PO) Take 2 each by mouth daily.   Yes [provider]  Multiple Vitamins-Minerals (MULTIVITAMIN WITH MINERALS) tablet Take 1 tablet by mouth daily.   Yes [provider]  mupirocin ointment (BACTROBAN) 2 % Apply 1 Application topically daily. 10/31/21  Yes Suzan Slick, NP  omeprazole (PRILOSEC) 20 MG capsule Take 20 mg by mouth daily.   Yes [provider]  Probiotic Product (PROBIOTIC PO) Take 1 capsule by mouth daily.   Yes [provider]  telmisartan (MICARDIS) 20 MG tablet Take 20 mg by mouth daily. 10/10/21  Yes [provider]  zolpidem (AMBIEN) 5 MG tablet Take 5 mg by mouth at bedtime. 08/20/21  Yes [provider]  aspirin EC 81 MG tablet Take 81 mg by mouth daily. Swallow whole.    [provider]     History reviewed. No pertinent family history.  Social History   Socioeconomic History   Marital status: Married    Spouse name: Not on file   Number of children: Not on file   Years of education: Not on file   Highest education level: Not on file  Occupational History   Not on file  Tobacco Use   Smoking status: Every Day    Packs/day: 0.50    Types: Cigarettes   Smokeless tobacco: Never  Vaping Use   Vaping Use: Never used  Substance and Sexual Activity   Alcohol use: Never   Drug use: Never   Sexual activity: Yes    Birth control/protection:  Surgical    Comment: Hysterectomy  Other Topics Concern   Not on file  Social History Narrative   Not on file   Social Determinants of Health   Financial Resource Strain: Not on file  Food Insecurity: Not on file  Transportation Needs: Not on file  Physical Activity: Not on file  Stress: Not on file  Social Connections: Not on file    Review of Systems: A 12 point ROS discussed and pertinent positives are indicated in the HPI above.  All other systems are negative.  Review of Systems  Constitutional:  Negative for activity change, fatigue and fever.  Respiratory:  Negative for cough and shortness of breath.   Cardiovascular:  Negative for chest pain.  Gastrointestinal:  Negative for abdominal pain.  Neurological:  Negative for weakness.  Psychiatric/Behavioral:  Negative for behavioral problems and confusion.     Vital Signs: BP (!) 169/80   Pulse 82   Temp 98.3 F (36.8 C) (Oral)   Resp 16   Ht 5\' 8"  (1.727 m)   Wt 186 lb (84.4 kg)   SpO2 98%   BMI 28.28 kg/m     Physical Exam Vitals reviewed.  HENT:     Mouth/Throat:     Mouth: Mucous membranes are moist.  Cardiovascular:     Rate and Rhythm: Normal rate and regular rhythm.     Heart sounds: Normal heart sounds.  Pulmonary:     Effort: Pulmonary effort is normal.     Breath sounds: Normal breath sounds.  Abdominal:     Palpations: Abdomen is soft.     Tenderness: There is no abdominal tenderness.  Musculoskeletal:        General: Normal range of motion.     Right lower leg: No edema.     Left lower leg: No edema.  Skin:    General: Skin is warm.  Neurological:     Mental Status: She is alert and oriented to person, place, and time.  Psychiatric:        Behavior: Behavior normal.     Imaging: No results found.  Labs:  CBC: Recent Labs    09/01/21 0346 09/28/21 0840 11/02/21 1257 11/20/21 0630  WBC 13.0* 12.1* 11.6* 11.9*  HGB 7.0* 8.5* 7.2* 7.0*  HCT 23.1* 27.3* 23.6* 22.1*  PLT 226  253 214 199    COAGS: No results for input(s): "INR", "APTT" in the last 8760 hours.  BMP: Recent Labs    08/30/21 0346 08/31/21 0431 09/01/21 0346 09/28/21 0840  NA 139 138 137 139  K 2.9* 3.4* 4.0 4.1  CL 91* 93* 95* 109  CO2 37* 34* 35* 20*  GLUCOSE 91 103* 108* 99  BUN 42* 39* 32* 29*  CALCIUM 7.5* 7.6* 7.9* 8.5*  CREATININE 3.28* 2.79* 2.40* 2.45*  GFRNONAA 16* 20* 23* 23*    LIVER FUNCTION TESTS: Recent Labs    08/29/21 1226  BILITOT 0.5  AST 16  ALT 16  ALKPHOS 107  PROT 8.0  ALBUMIN 3.1*    TUMOR MARKERS: No results for input(s): "AFPTM", "CEA", "CA199", "CHROMGRNA" in the last 8760 hours.  Assessment and Plan:  CKD stage 48 Follows with Dr Theador Hawthorne Request for random renal biopsy Scheduled today Risks and benefits of random renal biopsy was discussed with the patient and/or patient's family including, but not limited to bleeding, infection, damage to adjacent structures or low yield requiring additional tests.  All of the questions were answered and there is agreement to proceed.  Consent signed and in chart.   Thank you for this interesting consult.  I greatly enjoyed meeting Tracy Walsh and look forward to participating in their care.  A copy of this report was sent to the requesting provider on this date.  Electronically Signed: Lavonia Drafts, PA-C 11/20/2021, 7:00 AM   I spent a total of  30 Minutes   in face to face in clinical consultation, greater than 50% of which was counseling/coordinating care for random renal biopsy

## 2021-11-20 NOTE — Sedation Documentation (Signed)
Pt resting prone position. Procedure started. No complaints at this time

## 2021-11-20 NOTE — Procedures (Signed)
Interventional Radiology Procedure Note  Procedure: US guided random renal biopsy  Indication: CKD  Findings: Please refer to procedural dictation for full description.  Complications: None  EBL: < 10 mL  Miachel Roux, MD [phone]

## 2021-11-20 NOTE — Sedation Documentation (Signed)
Patient is resting comfortably. Asleep

## 2021-11-20 NOTE — Sedation Documentation (Signed)
Biopsies taken °

## 2021-11-20 NOTE — Sedation Documentation (Signed)
Patient is resting comfortably. 

## 2021-11-26 ENCOUNTER — Ambulatory Visit (INDEPENDENT_AMBULATORY_CARE_PROVIDER_SITE_OTHER): Payer: 59 | Admitting: Gastroenterology

## 2021-11-26 ENCOUNTER — Encounter (INDEPENDENT_AMBULATORY_CARE_PROVIDER_SITE_OTHER): Payer: Self-pay | Admitting: Gastroenterology

## 2021-11-26 VITALS — BP 155/84 | HR 91 | Temp 97.7°F | Ht 68.0 in | Wt 203.5 lb

## 2021-11-26 DIAGNOSIS — A0471 Enterocolitis due to Clostridium difficile, recurrent: Secondary | ICD-10-CM

## 2021-11-26 DIAGNOSIS — R600 Localized edema: Secondary | ICD-10-CM | POA: Insufficient documentation

## 2021-11-26 NOTE — Patient Instructions (Addendum)
Start Benefiber fiber supplements daily to increase stool bulk Continue Imodium as needed for diarrhea (if going out or having diarrhea during the night) Start a food diary - write down the food that does not sit well Referral to cardiology Continue daily probiotics

## 2021-11-26 NOTE — Progress Notes (Signed)
Tracy Walsh, M.D. Gastroenterology & Hepatology [facility] [address] [place], [place] [address]  Primary Care Physician: Neale Burly, MD [place] [address]  I will communicate my assessment and recommendations to the referring MD via EMR.  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  female with PMH history of systolic CHF 9last EG 41-32%, moderate pulmonary hypertension), Chronic inflammatory demyelinating polyneuropathy (CIDP), CKD stage IV, COPD, type 2 diabetes, CAD with LAD stent October 2022, who presents for follow-up of recurrent C. difficile.  The patient was last seen on 10/11/2021. At that time, the patient was given fidaxomicin for a total of 25 days per protocol and she was advised to take VOWST.  Patient reports that she finished her antibiotic and VOWST as advised. Since then she has presented persistent diarrhea but the number fo bowel movements has decreased to 3 Bms per day, between soft to watery. She was having 6-7 Bms per day prior to taking the antibiotic. She is presenting nausea but no vomiting. She has some cramping when she moves her bowels but it resolves after she moves her bowels and is not present during other times of the day. No nighttime episodes. Has not been taking fiber in diet, f she ollows a regular meal without restrictions, has not noticed any difference in her Bms with the intake of milk.  She has noticed worsening swelling of her legs. Believes her HF has worsened but does not have a cardiologist at the moment.  She would like to establish with a cardiologist in [place].  Takes a probiotic daily.  The patient denies having any nausea, vomiting, fever, chills, hematochezia, melena, hematemesis, abdominal distention, jaundice, pruritus or weight loss.  Last [id number] -no  report available Last Colonoscopy: 2022, no report available but she reports that she had a couple of polyps removed  Past Medical History: Past Medical History:  Diagnosis Date   Anemia    Anxiety    Asthma    Bipolar disorder ([place]) 2018   type 2   CHF (congestive heart failure) (HCC)    CIDP (chronic inflammatory demyelinating polyneuropathy) (HCC)    CKD (chronic kidney disease)    COPD (chronic obstructive pulmonary disease) ([place])    Depression    Diabetes mellitus without complication (HCC)    type 2   Diabetic foot ulcers (HCC)    Fibromyalgia    GERD (gastroesophageal reflux disease)    Hepatitis    Hep as a child - patient can give blood   History of blood transfusion 08/2021   1 unit per patient   History of esophagogastroduodenoscopy (EGD) 2018   History of kidney stones    passed stones   HLD (hyperlipidemia)    Hypertension    Migraines    Myocardial infarction (HCC)    x 2   Neuropathy    legs and feet - bilateral    Past Surgical History: Past Surgical History:  Procedure Laterality Date   ABDOMINAL HYSTERECTOMY     APPENDECTOMY     BILATERAL NASAL FRACTURE CLOSED REDUCTION  10/2020   CHOLECYSTECTOMY     COLONOSCOPY     CORONARY ANGIOPLASTY WITH STENT PLACEMENT  01/15/2021   FOOT SURGERY Right    I & D   I & D EXTREMITY Left 09/28/2021   Procedure: LEFT HEEL DEBRIDEMENT AND TISSUE GRAFT;  Surgeon: Sharol Given,  Illene Regulus, MD;  Location: [place];  Service: Orthopedics;  Laterality: Left;   SHOULDER SURGERY Bilateral    SPINAL FUSION     UPPER GI ENDOSCOPY      Family History:History reviewed. No pertinent family history.  Social History: Social History   Tobacco Use  Smoking Status Every Day   Packs/day: 0.50   Types: Cigarettes  Smokeless Tobacco Never   Social History   Substance and Sexual Activity  Alcohol Use Never   Social History   Substance and Sexual Activity  Drug Use Never    Allergies: Allergies  Allergen Reactions    Amoxicillin Anaphylaxis   Clarithromycin Anaphylaxis, Diarrhea, Itching, Nausea And Vomiting and Rash   Codeine Itching   Hydrocodone Itching   Moxifloxacin Anaphylaxis, Anxiety, Hives, Itching, Nausea And Vomiting, Palpitations and Shortness Of Breath   Penicillins Anaphylaxis   Sulfa Antibiotics Anaphylaxis   Nitrofurantoin Nausea And Vomiting   Crab (Diagnostic) Hives   Latex Hives, Itching, Rash and Swelling    Medications: Current Outpatient Medications  Medication Sig Dispense Refill   albuterol (PROVENTIL) (2.5 MG/3ML) 0.083% nebulizer solution Take 2.5 mg by nebulization every 6 (six) hours as needed for wheezing or shortness of breath.     amitriptyline (ELAVIL) 10 MG tablet Take 10 mg by mouth 2 (two) times daily.     ARIPiprazole (ABILIFY) 5 MG tablet Take 5 mg by mouth daily.     Ascorbic Acid (VITAMIN C) 1000 MG tablet Take 1,000 mg by mouth daily.     atorvastatin (LIPITOR) 40 MG tablet Take 40 mg by mouth at bedtime.     calcitRIOL (ROCALTROL) 0.25 MCG capsule Take 0.25 mcg by mouth every Monday, Wednesday, and Friday.     Cholecalciferol 25 MCG (1000 UT) tablet Take 1,000 Units by mouth daily.     cyanocobalamin (,VITAMIN B-12,) 1000 MCG/ML injection Inject 1,000 mcg into the muscle every 30 (thirty) days.     doxazosin (CARDURA) 4 MG tablet Take 4 mg by mouth daily.     DULoxetine (CYMBALTA) 60 MG capsule Take 60 mg by mouth daily.     ferrous sulfate 325 (65 FE) MG EC tablet Take 325 mg by mouth 2 (two) times daily.     furosemide (LASIX) 80 MG tablet Take 80 mg by mouth daily.     gabapentin (NEURONTIN) 100 MG capsule Take 200 mg by mouth 2 (two) times daily.     glimepiride (AMARYL) 2 MG tablet Take 2 mg by mouth daily before breakfast.     hydrALAZINE (APRESOLINE) 50 MG tablet Take 50 mg by mouth 2 (two) times daily.     isosorbide mononitrate (IMDUR) 30 MG 24 hr tablet Take 30 mg by mouth every morning.     metoprolol succinate (TOPROL-XL) 50 MG 24 hr tablet Take  50 mg by mouth daily.     Multiple Vitamins-Minerals (HAIR/SKIN/NAILS/BIOTIN PO) Take 2 each by mouth daily.     Multiple Vitamins-Minerals (MULTIVITAMIN WITH MINERALS) tablet Take 1 tablet by mouth daily.     mupirocin ointment (BACTROBAN) 2 % Apply 1 Application topically daily. 22 g 0   omeprazole (PRILOSEC) 20 MG capsule Take 20 mg by mouth daily.     Probiotic Product (PROBIOTIC PO) Take 1 capsule by mouth daily.     telmisartan (MICARDIS) 20 MG tablet Take 20 mg by mouth daily.     zolpidem (AMBIEN) 5 MG tablet Take 5 mg by mouth at bedtime.     aspirin EC 81 MG  tablet Take 81 mg by mouth daily. Swallow whole. (Patient not taking: Reported on 11/26/2021)     busPIRone (BUSPAR) 15 MG tablet Take 15 mg by mouth 2 (two) times daily. (Patient not taking: Reported on 11/26/2021)     No current facility-administered medications for this visit.    Review of Systems: GENERAL: negative for malaise, night sweats HEENT: No changes in hearing or vision, no nose bleeds or other nasal problems. NECK: Negative for lumps, goiter, pain and significant neck swelling RESPIRATORY: Negative for cough, wheezing CARDIOVASCULAR: Negative for chest pain, leg swelling, palpitations, orthopnea GI: SEE HPI MUSCULOSKELETAL: Negative for joint pain or swelling, back pain, and muscle pain. SKIN: Negative for lesions, rash PSYCH: Negative for sleep disturbance, mood disorder and recent psychosocial stressors. HEMATOLOGY Negative for prolonged bleeding, bruising easily, and swollen nodes. ENDOCRINE: Negative for cold or heat intolerance, polyuria, polydipsia and goiter. NEURO: negative for tremor, gait imbalance, syncope and seizures. The remainder of the review of systems is noncontributory.   Physical Exam: BP (!) 155/84 (BP Location: Left Arm, Patient Position: Sitting, Cuff Size: Large)   Pulse 91   Temp 97.7 F (36.5 C) (Oral)   Ht 5\' 8"  (1.727 m)   Wt 203 lb 8 oz (92.3 kg)   BMI 30.94 kg/m  GENERAL:  The patient is AO x3, in no acute distress. HEENT: Head is normocephalic and atraumatic. EOMI are intact. Mouth is well hydrated and without lesions. NECK: Supple. No masses LUNGS: Clear to auscultation. No presence of rhonchi/wheezing/rales. Adequate chest expansion HEART: RRR, normal s1 and s2. ABDOMEN: Soft, nontender, no guarding, no peritoneal signs, and nondistended. BS +. No masses. EXTREMITIES: Without any cyanosis, clubbing, rash, lesions. Has +2 edema up to both shins NEUROLOGIC: AOx3, no focal motor deficit. SKIN: no jaundice, no rashes  Imaging/Labs: as above  I personally reviewed and interpreted the available labs, imaging and endoscopic files.  Impression and Plan: Tracy Walsh is a 56 y.o.  female with PMH history of systolic CHF 9last EG 85-02%, moderate pulmonary hypertension), Chronic inflammatory demyelinating polyneuropathy (CIDP), CKD stage IV, COPD, type 2 diabetes, CAD with LAD stent October 2022, who presents for follow-up of recurrent C. difficile.  The patient to cause acute Oxymycin in bowels compliantly and has presented improvement in her bowel movement frequency and consistency although this has not normalized yet.  I consider that given the improvement of her symptoms she actually had resolution of her C. difficile clinically but it may take some time for the bowel movements to completely normalize.  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  I advised the patient to increase the intake of bulking agents such as Benefiber, but she can take Imodium as needed if she has breakthrough episodes of diarrhea.  She can also keep a food diary to determine what food worsens her symptoms.  Also, she can continue taking daily probiotics as these may help normalize the consistency of her stools.  We will readdress her anemia in her follow up appointment.  Finally, she presented worsening edema in her lower extremities, I will refer her to cardiology for further  management of her third spacing and heart failure.  - Start Benefiber fiber supplements daily to increase stool bulk - Continue Imodium as needed for diarrhea breakthrough episodes - Start a food diary - Referral to cardiology - Continue daily probiotics - Will discuss anemia workup in follow up appointment  All questions were answered.      Harvel Quale, MD Gastroenterology  and Hepatology [facility]

## 2021-12-03 ENCOUNTER — Encounter (HOSPITAL_COMMUNITY): Payer: Self-pay

## 2021-12-03 LAB — SURGICAL PATHOLOGY

## 2021-12-05 ENCOUNTER — Encounter: Payer: Self-pay | Admitting: Family

## 2021-12-05 ENCOUNTER — Ambulatory Visit: Payer: 59 | Admitting: Family

## 2021-12-05 DIAGNOSIS — R600 Localized edema: Secondary | ICD-10-CM

## 2021-12-05 DIAGNOSIS — L89623 Pressure ulcer of left heel, stage 3: Secondary | ICD-10-CM

## 2021-12-05 DIAGNOSIS — E1165 Type 2 diabetes mellitus with hyperglycemia: Secondary | ICD-10-CM

## 2021-12-05 NOTE — Progress Notes (Signed)
Office Visit Note   Patient: Tracy Walsh           Date of Birth: [birth year]           MRN: [id number] Visit Date: 12/05/2021              Requested by: Neale Burly, MD [address] [place],  [place] [address] PCP: Neale Burly, MD  Chief Complaint  Patient presents with   Left Foot - Routine Post Op    09/28/21 left heel deb w/ kerecis      HPI: The patient is a 56 year old woman who presents today in follow-up for left heel decubitus ulceration she did have Kerecis placed on June 16 she has been doing mupirocin dressing changes daily she unfortunately has had recent hospitalization she is concerned about worsening ulcer ration she has noticed new wounds to her anterior ankle she also reports while she was in the hospital she developed a right decubitus ulcer  Has new diagnosis of stage IV chronic kidney disease as well as congestive heart failure recurrent C. difficile  Assessment & Plan: Visit Diagnoses: No diagnosis found.  Plan: Placed a medical compression stocking on the left lower extremity given a spare she will use this for her daily dressing change and wound care after cleansing the ulcers daily continue the [facility] continue to offload bilateral heels no dressing required for the right heel there is no open wound  Follow-Up Instructions: No follow-ups on file.   Ortho Exam  Patient is alert, oriented, no adenopathy, well-dressed, normal affect, normal respiratory effort. On examination of the right heel there is a centimeter in diameter area of soft tissue injury this is stage II there is no open area there is dry flaking skin no erythema no warmth no sign of infection to the left heel please see attached photo she has open ulcer this is chronic this is 3 cm in diameter filled in with flat pink tissue there is epiboly this is 3 mm deep.  She has an anterior ulcer which is 15 mm in diameter there is no drainage no surrounding erythema  Imaging: No results  found.     Labs: Lab Results  Component Value Date   HGBA1C 7.3 (H) 08/30/2021   ESRSEDRATE 80 (H) 08/30/2021   CRP 1.5 (H) 08/30/2021     Lab Results  Component Value Date   ALBUMIN 3.1 (L) 08/29/2021    Lab Results  Component Value Date   MG 1.8 08/29/2021   No results found for: "VD25OH"  No results found for: "PREALBUMIN"    Latest Ref Rng & Units 11/20/2021    6:30 AM 11/02/2021   12:57 PM 09/28/2021    8:40 AM  CBC EXTENDED  WBC 4.0 - 10.5 K/uL 11.9  11.6  12.1   RBC 3.87 - 5.11 MIL/uL 2.28  2.55    2.54  3.10   Hemoglobin 12.0 - 15.0 g/dL 7.0  7.2  8.5   HCT 36.0 - 46.0 % 22.1  23.6  27.3   Platelets 150 - 400 K/uL 199  214  253   NEUT# 1.7 - 7.7 K/uL  9.7    Lymph# 0.7 - 4.0 K/uL  0.8       There is no height or weight on file to calculate BMI.  Orders:  No orders of the defined types were placed in this encounter.  No orders of the defined types were placed in this encounter.  Procedures: No procedures performed  Clinical Data: No additional findings.  ROS:  All other systems negative, except as noted in the HPI. Review of Systems  Objective: Vital Signs: There were no vitals taken for this visit.  Specialty Comments:  No specialty comments available.  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)    Chronic combined systolic and diastolic CHF (congestive heart failure) ([place]) 08/31/2021   Chronic respiratory failure with hypoxia ([place]) 08/31/2021   Acute renal failure superimposed on stage 4 chronic kidney disease ([place]) 08/29/2021   Chronic diarrhea 08/29/2021   Hypokalemia 08/29/2021   Essential hypertension 08/29/2021   Uncontrolled type 2 diabetes mellitus with hyperglycemia, without long-term  current use of insulin ([place]) 08/29/2021   Diabetic foot ulcer ([place]) 08/29/2021   CAD (coronary artery disease) 08/29/2021   Past Medical History:  Diagnosis Date   Anemia    Anxiety    Asthma    Bipolar disorder ([place]) 2018   type 2   CHF (congestive heart failure) (HCC)    CIDP (chronic inflammatory demyelinating polyneuropathy) (HCC)    CKD (chronic kidney disease)    COPD (chronic obstructive pulmonary disease) (HCC)    Depression    Diabetes mellitus without complication (HCC)    type 2   Diabetic foot ulcers (HCC)    Fibromyalgia    GERD (gastroesophageal reflux disease)    Hepatitis    Hep as a child - patient can give blood   History of blood transfusion 08/2021   1 unit per patient   History of esophagogastroduodenoscopy (EGD) 2018   History of kidney stones    passed stones   HLD (hyperlipidemia)    Hypertension    Migraines    Myocardial infarction (HCC)    x 2   Neuropathy    legs and feet - bilateral    No family history on file.  Past Surgical History:  Procedure Laterality Date   ABDOMINAL HYSTERECTOMY     APPENDECTOMY     BILATERAL NASAL FRACTURE CLOSED REDUCTION  10/2020   CHOLECYSTECTOMY     COLONOSCOPY     CORONARY ANGIOPLASTY WITH STENT PLACEMENT  01/15/2021   FOOT SURGERY Right    I & D   I & D EXTREMITY Left 09/28/2021   Procedure: LEFT HEEL DEBRIDEMENT AND TISSUE GRAFT;  Surgeon: Newt Minion, MD;  Location: [facility];  Service: Orthopedics;  Laterality: Left;   SHOULDER SURGERY Bilateral    SPINAL FUSION     UPPER GI ENDOSCOPY     Social History   Occupational History   Not on file  Tobacco Use   Smoking status: Every Day    Packs/day: 0.50    Types: Cigarettes   Smokeless tobacco: Never  Vaping Use   Vaping Use: Never used  Substance and Sexual Activity   Alcohol use: Never   Drug use: Never   Sexual activity: Yes    Birth control/protection: Surgical    Comment: Hysterectomy

## 2021-12-06 ENCOUNTER — Inpatient Hospital Stay: Payer: 59

## 2021-12-06 DIAGNOSIS — N184 Chronic kidney disease, stage 4 (severe): Secondary | ICD-10-CM | POA: Diagnosis not present

## 2021-12-06 DIAGNOSIS — D509 Iron deficiency anemia, unspecified: Secondary | ICD-10-CM

## 2021-12-06 LAB — CBC WITH DIFFERENTIAL/PLATELET
Abs Immature Granulocytes: 0.06 10*3/uL (ref 0.00–0.07)
Basophils Absolute: 0.1 10*3/uL (ref 0.0–0.1)
Basophils Relative: 1 %
Eosinophils Absolute: 0.3 10*3/uL (ref 0.0–0.5)
Eosinophils Relative: 3 %
HCT: 27.8 % — ABNORMAL LOW (ref 36.0–46.0)
Hemoglobin: 8.7 g/dL — ABNORMAL LOW (ref 12.0–15.0)
Immature Granulocytes: 1 %
Lymphocytes Relative: 8 %
Lymphs Abs: 0.7 10*3/uL (ref 0.7–4.0)
MCH: 30.3 pg (ref 26.0–34.0)
MCHC: 31.3 g/dL (ref 30.0–36.0)
MCV: 96.9 fL (ref 80.0–100.0)
Monocytes Absolute: 0.7 10*3/uL (ref 0.1–1.0)
Monocytes Relative: 8 %
Neutro Abs: 7.3 10*3/uL (ref 1.7–7.7)
Neutrophils Relative %: 79 %
Platelets: 211 10*3/uL (ref 150–400)
RBC: 2.87 MIL/uL — ABNORMAL LOW (ref 3.87–5.11)
RDW: 16.6 % — ABNORMAL HIGH (ref 11.5–15.5)
WBC: 9.1 10*3/uL (ref 4.0–10.5)
nRBC: 0 % (ref 0.0–0.2)

## 2021-12-06 LAB — IRON AND TIBC
Iron: 55 ug/dL (ref 28–170)
Saturation Ratios: 19 % (ref 10.4–31.8)
TIBC: 285 ug/dL (ref 250–450)
UIBC: 230 ug/dL

## 2021-12-06 LAB — FERRITIN: Ferritin: 268 ng/mL (ref 11–307)

## 2021-12-07 ENCOUNTER — Encounter: Payer: Self-pay | Admitting: Internal Medicine

## 2021-12-07 ENCOUNTER — Encounter (HOSPITAL_COMMUNITY): Payer: Self-pay | Admitting: Hematology

## 2021-12-07 ENCOUNTER — Ambulatory Visit (INDEPENDENT_AMBULATORY_CARE_PROVIDER_SITE_OTHER): Payer: 59 | Admitting: Internal Medicine

## 2021-12-07 VITALS — BP 165/74 | HR 90 | Ht 68.0 in | Wt 194.2 lb

## 2021-12-07 DIAGNOSIS — I25119 Atherosclerotic heart disease of native coronary artery with unspecified angina pectoris: Secondary | ICD-10-CM

## 2021-12-07 DIAGNOSIS — F339 Major depressive disorder, recurrent, unspecified: Secondary | ICD-10-CM

## 2021-12-07 DIAGNOSIS — Z720 Tobacco use: Secondary | ICD-10-CM | POA: Diagnosis not present

## 2021-12-07 DIAGNOSIS — E08621 Diabetes mellitus due to underlying condition with foot ulcer: Secondary | ICD-10-CM

## 2021-12-07 DIAGNOSIS — I10 Essential (primary) hypertension: Secondary | ICD-10-CM

## 2021-12-07 DIAGNOSIS — D509 Iron deficiency anemia, unspecified: Secondary | ICD-10-CM

## 2021-12-07 DIAGNOSIS — R69 Illness, unspecified: Secondary | ICD-10-CM | POA: Diagnosis not present

## 2021-12-07 DIAGNOSIS — I5042 Chronic combined systolic (congestive) and diastolic (congestive) heart failure: Secondary | ICD-10-CM | POA: Diagnosis not present

## 2021-12-07 DIAGNOSIS — Z0000 Encounter for general adult medical examination without abnormal findings: Secondary | ICD-10-CM | POA: Insufficient documentation

## 2021-12-07 DIAGNOSIS — L97409 Non-pressure chronic ulcer of unspecified heel and midfoot with unspecified severity: Secondary | ICD-10-CM

## 2021-12-07 DIAGNOSIS — G4700 Insomnia, unspecified: Secondary | ICD-10-CM | POA: Diagnosis not present

## 2021-12-07 DIAGNOSIS — N184 Chronic kidney disease, stage 4 (severe): Secondary | ICD-10-CM

## 2021-12-07 DIAGNOSIS — E1165 Type 2 diabetes mellitus with hyperglycemia: Secondary | ICD-10-CM

## 2021-12-07 DIAGNOSIS — G6181 Chronic inflammatory demyelinating polyneuritis: Secondary | ICD-10-CM | POA: Diagnosis not present

## 2021-12-07 DIAGNOSIS — K529 Noninfective gastroenteritis and colitis, unspecified: Secondary | ICD-10-CM

## 2021-12-07 HISTORY — DX: Major depressive disorder, recurrent, unspecified: F33.9

## 2021-12-07 MED ORDER — ZOLPIDEM TARTRATE 5 MG PO TABS
5.0000 mg | ORAL_TABLET | Freq: Every day | ORAL | 0 refills | Status: DC
Start: 2021-12-07 — End: 2022-01-25

## 2021-12-07 MED ORDER — AMITRIPTYLINE HCL 10 MG PO TABS: 10.0000 mg | ORAL_TABLET | Freq: Two times a day (BID) | ORAL | 2 refills | Status: DC

## 2021-12-07 MED ORDER — BUSPIRONE HCL 15 MG PO TABS: 15.0000 mg | ORAL_TABLET | Freq: Two times a day (BID) | ORAL | 2 refills | Status: DC

## 2021-12-07 MED ORDER — GABAPENTIN 100 MG PO CAPS: 200.0000 mg | ORAL_CAPSULE | Freq: Two times a day (BID) | ORAL | 2 refills | Status: DC

## 2021-12-07 MED ORDER — GLIMEPIRIDE 2 MG PO TABS: 2.0000 mg | ORAL_TABLET | Freq: Every day | ORAL | 2 refills | Status: DC

## 2021-12-07 MED ORDER — METOPROLOL SUCCINATE ER 50 MG PO TB24: 50.0000 mg | ORAL_TABLET | Freq: Every day | ORAL | 2 refills | Status: DC

## 2021-12-07 MED ORDER — DULOXETINE HCL 60 MG PO CPEP: 60.0000 mg | ORAL_CAPSULE | Freq: Every day | ORAL | 2 refills | Status: DC

## 2021-12-07 NOTE — Assessment & Plan Note (Signed)
She reports a history of 2 prior MIs.  Denies chest pain currently.  She is taking atorvastatin, metoprolol, and telmisartan.  Previously prescribed ASA 81 mg daily, however she is not currently taking it due to concern for bleeding.

## 2021-12-07 NOTE — Assessment & Plan Note (Signed)
Currently taking Cymbalta, gabapentin, and Elavil for management of CIDP.  Previously received IVIG infusions.

## 2021-12-07 NOTE — Assessment & Plan Note (Signed)
-  I have referred her today for a screening mammogram -She reports undergoing a colonoscopy last year.  We will obtain her previous medical records. -States that she has undergone partial hysterectomy and does not need Pap smear. Will obtain and review previous medical records.

## 2021-12-07 NOTE — Progress Notes (Signed)
New Patient Office Visit  Subjective    Patient ID: Tracy Walsh, female    DOB: May 16, 1965  Age: 56 y.o. MRN: [id number]  CC:  Chief Complaint  Patient presents with   Establish Care    Needs meds refilled    HPI Tracy Walsh presents to establish care.  She is a 56 year old woman with an extensive past medical history notable for CAD, systolic HF, HTN, IDA, CKD 4, T2DM, CIDP, depression, diabetic foot ulcers, and current tobacco use.  She was previously followed by Dr. Sherrie Sport in [place], [place].  Presenting today to establish care.  Tracy Walsh acute concerns are needing numerous medication refills and persistent diarrhea.  This is in the setting of recurrent C. 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  She is followed by GI for the symptoms, seen earlier this month.  She is accompanied by her husband today.  Acute concerns and chronic medical issues discussed today are individually addressed in A/P below.  Outpatient Encounter Medications as of 12/07/2021  Medication Sig   albuterol (PROVENTIL) (2.5 MG/3ML) 0.083% nebulizer solution Take 2.5 mg by nebulization every 6 (six) hours as needed for wheezing or shortness of breath.   ARIPiprazole (ABILIFY) 5 MG tablet Take 5 mg by mouth daily.   Ascorbic Acid (VITAMIN C) 1000 MG tablet Take 1,000 mg by mouth daily.   atorvastatin (LIPITOR) 40 MG tablet Take 40 mg by mouth at bedtime.   calcitRIOL (ROCALTROL) 0.25 MCG capsule Take 0.25 mcg by mouth every Monday, Wednesday, and Friday.   Cholecalciferol 25 MCG (1000 UT) tablet Take 1,000 Units by mouth daily.   cyanocobalamin (,VITAMIN B-12,) 1000 MCG/ML injection Inject 1,000 mcg into the muscle every 30 (thirty) days.   doxazosin (CARDURA) 4 MG tablet Take 4 mg by mouth daily.   ferrous sulfate 325 (65 FE) MG EC tablet Take 325 mg by mouth 2 (two) times daily.   furosemide (LASIX) 80 MG tablet Take 80 mg by mouth daily.   hydrALAZINE (APRESOLINE) 50 MG  tablet Take 50 mg by mouth 2 (two) times daily.   isosorbide mononitrate (IMDUR) 30 MG 24 hr tablet Take 30 mg by mouth every morning.   Multiple Vitamins-Minerals (HAIR/SKIN/NAILS/BIOTIN PO) Take 2 each by mouth daily.   Multiple Vitamins-Minerals (MULTIVITAMIN WITH MINERALS) tablet Take 1 tablet by mouth daily.   mupirocin ointment (BACTROBAN) 2 % Apply 1 Application topically daily.   omeprazole (PRILOSEC) 20 MG capsule Take 20 mg by mouth daily.   Probiotic Product (PROBIOTIC PO) Take 1 capsule by mouth daily.   telmisartan (MICARDIS) 20 MG tablet Take 20 mg by mouth daily.   [DISCONTINUED] amitriptyline (ELAVIL) 10 MG tablet Take 10 mg by mouth 2 (two) times daily.   [DISCONTINUED] busPIRone (BUSPAR) 15 MG tablet Take 15 mg by mouth 2 (two) times daily.   [DISCONTINUED] DULoxetine (CYMBALTA) 60 MG capsule Take 60 mg by mouth daily.   [DISCONTINUED] gabapentin (NEURONTIN) 100 MG capsule Take 200 mg by mouth 2 (two) times daily.   [DISCONTINUED] glimepiride (AMARYL) 2 MG tablet Take 2 mg by mouth daily before breakfast.   [DISCONTINUED] metoprolol succinate (TOPROL-XL) 50 MG 24 hr tablet Take 50 mg by mouth daily.   [DISCONTINUED] zolpidem (AMBIEN) 5 MG tablet Take 5 mg by mouth at bedtime.   amitriptyline (ELAVIL) 10 MG tablet Take 1 tablet (10 mg total) by mouth 2 (two) times daily.   aspirin EC 81 MG tablet Take 81 mg by mouth daily. Swallow whole. (  Patient not taking: Reported on 11/26/2021)   busPIRone (BUSPAR) 15 MG tablet Take 1 tablet (15 mg total) by mouth 2 (two) times daily.   DULoxetine (CYMBALTA) 60 MG capsule Take 1 capsule (60 mg total) by mouth daily.   gabapentin (NEURONTIN) 100 MG capsule Take 2 capsules (200 mg total) by mouth 2 (two) times daily.   glimepiride (AMARYL) 2 MG tablet Take 1 tablet (2 mg total) by mouth daily before breakfast.   metoprolol succinate (TOPROL-XL) 50 MG 24 hr tablet Take 1 tablet (50 mg total) by mouth daily.   zolpidem (AMBIEN) 5 MG tablet Take  1 tablet (5 mg total) by mouth at bedtime.   No facility-administered encounter medications on file as of 12/07/2021.    Past Medical History:  Diagnosis Date   Anemia    Anxiety    Asthma    Bipolar disorder ([place]) 2018   type 2   CHF (congestive heart failure) (HCC)    CIDP (chronic inflammatory demyelinating polyneuropathy) (HCC)    CKD (chronic kidney disease)    COPD (chronic obstructive pulmonary disease) (HCC)    Depression    Diabetes mellitus without complication (HCC)    type 2   Diabetic foot ulcers (HCC)    Fibromyalgia    GERD (gastroesophageal reflux disease)    Hepatitis    Hep as a child - patient can give blood   History of blood transfusion 08/2021   1 unit per patient   History of esophagogastroduodenoscopy (EGD) 2018   History of kidney stones    passed stones   HLD (hyperlipidemia)    Hypertension    Migraines    Myocardial infarction (HCC)    x 2   Neuropathy    legs and feet - bilateral    Past Surgical History:  Procedure Laterality Date   ABDOMINAL HYSTERECTOMY     APPENDECTOMY     BILATERAL NASAL FRACTURE CLOSED REDUCTION  10/2020   CHOLECYSTECTOMY     COLONOSCOPY     CORONARY ANGIOPLASTY WITH STENT PLACEMENT  01/15/2021   FOOT SURGERY Right    I & D   I & D EXTREMITY Left 09/28/2021   Procedure: LEFT HEEL DEBRIDEMENT AND TISSUE GRAFT;  Surgeon: Newt Minion, MD;  Location: [facility];  Service: Orthopedics;  Laterality: Left;   SHOULDER SURGERY Bilateral    SPINAL FUSION     UPPER GI ENDOSCOPY      No family history on file.  Social History   Socioeconomic History   Marital status: Married    Spouse name: Not on file   Number of children: Not on file   Years of education: Not on file   Highest education level: Not on file  Occupational History   Not on file  Tobacco Use   Smoking status: Every Day    Packs/day: 0.50    Types: Cigarettes   Smokeless tobacco: Never  Vaping Use   Vaping Use: Never used  Substance and Sexual  Activity   Alcohol use: Never   Drug use: Never   Sexual activity: Yes    Birth control/protection: Surgical    Comment: Hysterectomy  Other Topics Concern   Not on file  Social History Narrative   Not on file   Social Determinants of Health   Financial Resource Strain: Not on file  Food Insecurity: Not on file  Transportation Needs: Not on file  Physical Activity: Not on file  Stress: Not on file  Social Connections: Not  on file  Intimate Partner Violence: Not on file    Review of Systems  Constitutional:  Negative for chills and fever.  HENT:  Negative for sore throat.   Respiratory:  Negative for cough and shortness of breath.   Cardiovascular:  Negative for chest pain, palpitations and leg swelling.  Gastrointestinal:  Positive for diarrhea. Negative for abdominal pain, blood in stool, constipation, nausea and vomiting.  Genitourinary:  Negative for dysuria and hematuria.  Musculoskeletal:  Negative for myalgias.  Skin:  Negative for itching and rash.       Ulcers on both feet  Neurological:  Negative for dizziness and headaches.  Psychiatric/Behavioral:  Negative for depression and suicidal ideas.     Objective    BP (!) 165/74   Pulse 90   Ht 5\' 8"  (1.727 m)   Wt 194 lb 3.2 oz (88.1 kg)   SpO2 97%   BMI 29.53 kg/m   Physical Exam Vitals reviewed.  Constitutional:      General: She is not in acute distress.    Appearance: Normal appearance.     Comments: Appears chronically ill  HENT:     Head: Normocephalic and atraumatic.     Nose: Nose normal. No congestion or rhinorrhea.     Mouth/Throat:     Mouth: Mucous membranes are moist.     Pharynx: Oropharynx is clear. No oropharyngeal exudate or posterior oropharyngeal erythema.  Eyes:     General: No scleral icterus.    Extraocular Movements: Extraocular movements intact.     Conjunctiva/sclera: Conjunctivae normal.     Pupils: Pupils are equal, round, and reactive to light.  Cardiovascular:     Rate  and Rhythm: Normal rate and regular rhythm.     Pulses: Normal pulses.     Heart sounds: Normal heart sounds.  Pulmonary:     Effort: Pulmonary effort is normal.     Comments: Bilateral wheezes on exam Abdominal:     General: Bowel sounds are normal. There is no distension.     Palpations: Abdomen is soft.     Tenderness: There is no abdominal tenderness.  Musculoskeletal:     Cervical back: Normal range of motion.     Right lower leg: Edema present.     Left lower leg: Edema present.  Lymphadenopathy:     Cervical: No cervical adenopathy.  Skin:    General: Skin is warm and dry.     Capillary Refill: Capillary refill takes less than 2 seconds.     Comments: Ulcerations noted on both heels as well as the dorsal aspect of the left foot.  Neurological:     General: No focal deficit present.     Mental Status: She is alert and oriented to person, place, and time.     Gait: Gait abnormal.  Psychiatric:        Mood and Affect: Mood normal.        Behavior: Behavior normal.    Diabetic foot exam was performed.  Visual Findings: There are ulcerations present on both heels as well as on the dorsal aspect of the left foot. Posterior tibialis and dorsalis pulse intact bilaterally.  Sensation: Grossly diminished across both feet.    Last CBC Lab Results  Component Value Date   WBC 9.1 12/06/2021   HGB 8.7 (L) 12/06/2021   HCT 27.8 (L) 12/06/2021   MCV 96.9 12/06/2021   MCH 30.3 12/06/2021   RDW 16.6 (H) 12/06/2021   PLT 211 12/06/2021  Last metabolic panel Lab Results  Component Value Date   GLUCOSE 99 09/28/2021   NA 139 09/28/2021   K 4.1 09/28/2021   CL 109 09/28/2021   CO2 20 (L) 09/28/2021   BUN 29 (H) 09/28/2021   CREATININE 2.45 (H) 09/28/2021   GFRNONAA 23 (L) 09/28/2021   CALCIUM 8.5 (L) 09/28/2021   PROT 8.0 08/29/2021   ALBUMIN 3.1 (L) 08/29/2021   BILITOT 0.5 08/29/2021   ALKPHOS 107 08/29/2021   AST 16 08/29/2021   ALT 16 08/29/2021   ANIONGAP 10  09/28/2021   Last hemoglobin A1c Lab Results  Component Value Date   HGBA1C 7.3 (H) 08/30/2021   Last vitamin B12 and Folate Lab Results  Component Value Date   VITAMINB12 1,079 (H) 09/01/2021   FOLATE 20.5 09/01/2021     Assessment & Plan:   Problem List Items Addressed This Visit       Cardiovascular and Mediastinum   Essential hypertension    Poorly controlled.  BP today is 165/74.  She is currently prescribed hydralazine, Imdur, metoprolol succinate, and telmisartan.  Per chart review, she has been reluctant to make any additional changes to her antihypertensive regimen.  She would like to discuss medication changes with her nephrologist.      Relevant Medications   metoprolol succinate (TOPROL-XL) 50 MG 24 hr tablet   CAD (coronary artery disease)    She reports a history of 2 prior MIs.  Denies chest pain currently.  She is taking atorvastatin, metoprolol, and telmisartan.  Previously prescribed ASA 81 mg daily, however she is not currently taking it due to concern for bleeding.      Relevant Medications   metoprolol succinate (TOPROL-XL) 50 MG 24 hr tablet   Chronic combined systolic and diastolic CHF (congestive heart failure) ([place])    EF 40-45% on echo from March 2023.  She is taking Lasix 80 mg daily in addition to metoprolol and telmisartan.  On exam today she has 2+ bilateral pitting edema.      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  Last seen earlier this month.  Today she reports recurrent diarrhea.  She was counseled by GI on taking bulking agents, such as Benefiber, and told that she could use Imodium if needed.  Despite these measures, her diarrhea persists.  I recommended that she contact GI for further management recommendations.        Endocrine   Uncontrolled  type 2 diabetes mellitus with hyperglycemia, without long-term current use of insulin (HCC)    Last HgbA1c 7.3.  She is currently taking glimepiride.  We can consider adding GLP-1 therapy and will discuss this at her next appointment.      Relevant Medications   glimepiride (AMARYL) 2 MG tablet   Diabetic foot ulcer ([place])    She has bilateral diabetic foot ulcers on her heels as well as on the dorsum of the left foot.  She previously underwent debridement of the left heel ulceration with Dr. Sharol Given of [facility].  She is interested in moving her care to [facility] as it is more convenient for her to get to that location.  I recommended that she discuss this with her surgeon, who could communicate with the surgeons in [place] for better coordination of care.  Relevant Medications   glimepiride (AMARYL) 2 MG tablet     Nervous and Auditory   CIDP (chronic inflammatory demyelinating polyneuropathy) (HCC)    Currently taking Cymbalta, gabapentin, and Elavil for management of CIDP.  Previously received IVIG infusions.      Relevant Medications   DULoxetine (CYMBALTA) 60 MG capsule   busPIRone (BUSPAR) 15 MG tablet   gabapentin (NEURONTIN) 100 MG capsule   amitriptyline (ELAVIL) 10 MG tablet   zolpidem (AMBIEN) 5 MG tablet   Other Relevant Orders   Ambulatory referral to Psychiatry     Genitourinary   Chronic kidney disease, stage 4 (severe) (HCC)    Approaching stage V CKD.  She is followed by nephrology, Dr. Theador Hawthorne and has follow-up scheduled for next week.        Other   Iron deficiency anemia    Followed by hematology and received an iron infusion earlier this month.  Likely due to advanced CKD, however there is also concern for GI loss.  She is followed by GI as well.  Denies fatigue currently.      Depression, recurrent ([place])    She is currently taking BuSpar, Abilify, Cymbalta, and Elavil for management of both depression and CIDP.  I have referred her to  psychiatry today at her request for further management of her antidepressants as she is not sure that she needs to be taking all this medication, which I would agree with.      Relevant Medications   DULoxetine (CYMBALTA) 60 MG capsule   busPIRone (BUSPAR) 15 MG tablet   amitriptyline (ELAVIL) 10 MG tablet   Other Relevant Orders   Ambulatory referral to Psychiatry   Tobacco use    She is currently smoking cigarettes 0.5 packs/day and has been smoking since age of 48.  She relates that she has tried numerous medications previously including Chantix, Wellbutrin, and several NRT options without sustained cessation.  Although she knows that she needs to stop smoking, she does not seem motivated to do so today given her previous failed attempts.  I again counseled her on the imperative nature of smoking cessation given her numerous chronic medical conditions.      Preventative health care - Primary    -I have referred her today for a screening mammogram -She reports undergoing a colonoscopy last year.  We will obtain her previous medical records. -States that she has undergone partial hysterectomy and does not need Pap smear. Will obtain and review previous medical records.      Relevant Orders   MM Digital Screening     Return in about 2 weeks (around 12/21/2021).   [name], MD

## 2021-12-07 NOTE — Assessment & Plan Note (Signed)
Approaching stage V CKD.  She is followed by nephrology, Dr. Theador Hawthorne and has follow-up scheduled for next week.

## 2021-12-07 NOTE — Patient Instructions (Signed)
It was a pleasure to see you today.  Thank you for giving [place] the opportunity to be involved in your care.  Below is a brief recap of your visit and next steps.  We will plan to see you again in 2-4 weeks.  Summary I have referred you to psychiatry and for a mammogram today I have also refilled your medications  Next steps We will plan for follow up in 2-4 weeks to better address your chronic medical conditions.

## 2021-12-07 NOTE — Assessment & Plan Note (Signed)
She is currently taking BuSpar, Abilify, Cymbalta, and Elavil for management of both depression and CIDP.  I have referred her to psychiatry today at her request for further management of her antidepressants as she is not sure that she needs to be taking all this medication, which I would agree with.

## 2021-12-07 NOTE — Assessment & Plan Note (Signed)
Last HgbA1c 7.3.  She is currently taking glimepiride.  We can consider adding GLP-1 therapy and will discuss this at her next appointment.

## 2021-12-07 NOTE — Assessment & Plan Note (Signed)
She is currently smoking cigarettes 0.5 packs/day and has been smoking since age of 76.  She relates that she has tried numerous medications previously including Chantix, Wellbutrin, and several NRT options without sustained cessation.  Although she knows that she needs to stop smoking, she does not seem motivated to do so today given her previous failed attempts.  I again counseled her on the imperative nature of smoking cessation given her numerous chronic medical conditions.

## 2021-12-07 NOTE — Assessment & Plan Note (Signed)
Poorly controlled.  BP today is 165/74.  She is currently prescribed hydralazine, Imdur, metoprolol succinate, and telmisartan.  Per chart review, she has been reluctant to make any additional changes to her antihypertensive regimen.  She would like to discuss medication changes with her nephrologist.

## 2021-12-07 NOTE — Assessment & Plan Note (Signed)
Followed by hematology and received an iron infusion earlier this month.  Likely due to advanced CKD, however there is also concern for GI loss.  She is followed by GI as well.  Denies fatigue currently.

## 2021-12-07 NOTE — Assessment & Plan Note (Signed)
EF 40-45% on echo from March 2023.  She is taking Lasix 80 mg daily in addition to metoprolol and telmisartan.  On exam today she has 2+ bilateral pitting edema.

## 2021-12-07 NOTE — Assessment & Plan Note (Addendum)
She has bilateral diabetic foot ulcers on her heels as well as on the dorsum of the left foot.  She previously underwent debridement of the left heel ulceration with Dr. Sharol Given of [facility].  She is interested in moving her care to [facility] as it is more convenient for her to get to that location.  I recommended that she discuss this with her surgeon, who could communicate with the surgeons in [place] for better coordination of care.

## 2021-12-07 NOTE — Assessment & Plan Note (Signed)
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  Last seen earlier this month.  Today she reports recurrent diarrhea.  She was counseled by GI on taking bulking agents, such as Benefiber, and told that she could use Imodium if needed.  Despite these measures, her diarrhea persists.  I recommended that she contact GI for further management recommendations.

## 2021-12-09 LAB — METHYLMALONIC ACID, SERUM: Methylmalonic Acid, Quantitative: 243 nmol/L (ref 0–378)

## 2021-12-10 LAB — IMMUNOFIXATION ELECTROPHORESIS
IgA: 257 mg/dL (ref 87–352)
IgG (Immunoglobin G), Serum: 1774 mg/dL — ABNORMAL HIGH (ref 586–1602)
IgM (Immunoglobulin M), Srm: 92 mg/dL (ref 26–217)
Total Protein ELP: 6.8 g/dL (ref 6.0–8.5)

## 2021-12-11 LAB — COPPER, SERUM: Copper: 70 ug/dL — ABNORMAL LOW (ref 80–158)

## 2021-12-12 NOTE — Progress Notes (Unsigned)
[address], [place] [address]   CLINIC:  Medical Oncology/Hematology  PCP:  [name], MD [address] [place] [address] [phone]   REASON FOR VISIT:  Follow-up for normocytic anemia (CKD stage IV, functional iron deficiency, copper deficiency)  CURRENT THERAPY: Intermittent IV iron  INTERVAL HISTORY:  Tracy Walsh 56 y.o. female returns for routine follow-up of her normocytic anemia.  She was seen for initial consultation by Dr. Delton Coombes on 11/02/2021.  At today's visit, she reports feeling fair.  No recent hospitalizations, surgeries, or changes in baseline health status.  She remains fatigued, but had some briefly improved energy for 3 to 4 days after each of her IV iron infusions.  She denies any prior blood per rectum, melena, or epistaxis.  She has restless legs and increased frequency of headaches.  She denies any pica, chest pain, dyspnea on exertion, or lightheadedness.  She reports syncopal episode 2 nights ago, but reports that this was secondary to hypoglycemia (blood sugar was 51).  She is taking iron tablet twice daily.  She has 40% energy and 50% appetite. She endorses that she is maintaining a stable weight.   REVIEW OF SYSTEMS:  Review of Systems  Constitutional:  Positive for fatigue. Negative for appetite change, chills, diaphoresis, fever and unexpected weight change.  HENT:   Negative for lump/mass and nosebleeds.   Eyes:  Negative for eye problems.  Respiratory:  Negative for cough, hemoptysis and shortness of breath.   Cardiovascular:  Negative for chest pain, leg swelling and palpitations.  Gastrointestinal:  Negative for abdominal pain, blood in stool, constipation, diarrhea, nausea and vomiting.  Genitourinary:  Negative for hematuria.   Musculoskeletal:  Positive for arthralgias and myalgias.  Skin: Negative.   Neurological:  Negative for dizziness, headaches and light-headedness.  Hematological:   Does not bruise/bleed easily.      PAST MEDICAL/SURGICAL HISTORY:  Past Medical History:  Diagnosis Date   Anemia    Anxiety    Asthma    Bipolar disorder ([place]) 2018   type 2   CHF (congestive heart failure) (HCC)    CIDP (chronic inflammatory demyelinating polyneuropathy) (HCC)    CKD (chronic kidney disease)    COPD (chronic obstructive pulmonary disease) (HCC)    Depression    Diabetes mellitus without complication (HCC)    type 2   Diabetic foot ulcers (HCC)    Fibromyalgia    GERD (gastroesophageal reflux disease)    Hepatitis    Hep as a child - patient can give blood   History of blood transfusion 08/2021   1 unit per patient   History of esophagogastroduodenoscopy (EGD) 2018   History of kidney stones    passed stones   HLD (hyperlipidemia)    Hypertension    Migraines    Myocardial infarction (HCC)    x 2   Neuropathy    legs and feet - bilateral   Past Surgical History:  Procedure Laterality Date   ABDOMINAL HYSTERECTOMY     APPENDECTOMY     BILATERAL NASAL FRACTURE CLOSED REDUCTION  10/2020   CHOLECYSTECTOMY     COLONOSCOPY     CORONARY ANGIOPLASTY WITH STENT PLACEMENT  01/15/2021   FOOT SURGERY Right    I & D   I & D EXTREMITY Left 09/28/2021   Procedure: LEFT HEEL DEBRIDEMENT AND TISSUE GRAFT;  Surgeon: Newt Minion, MD;  Location: [facility];  Service: Orthopedics;  Laterality: Left;  SHOULDER SURGERY Bilateral    SPINAL FUSION     UPPER GI ENDOSCOPY       SOCIAL HISTORY:  Social History   Socioeconomic History   Marital status: Married    Spouse name: Not on file   Number of children: Not on file   Years of education: Not on file   Highest education level: Not on file  Occupational History   Not on file  Tobacco Use   Smoking status: Every Day    Packs/day: 0.50    Types: Cigarettes   Smokeless tobacco: Never  Vaping Use   Vaping Use: Never used  Substance and Sexual Activity   Alcohol use: Never   Drug use: Never   Sexual  activity: Yes    Birth control/protection: Surgical    Comment: Hysterectomy  Other Topics Concern   Not on file  Social History Narrative   Not on file   Social Determinants of Health   Financial Resource Strain: Not on file  Food Insecurity: Not on file  Transportation Needs: Not on file  Physical Activity: Not on file  Stress: Not on file  Social Connections: Not on file  Intimate Partner Violence: Not on file    FAMILY HISTORY:  No family history on file.  CURRENT MEDICATIONS:  Outpatient Encounter Medications as of 12/13/2021  Medication Sig Note   albuterol (PROVENTIL) (2.5 MG/3ML) 0.083% nebulizer solution Take 2.5 mg by nebulization every 6 (six) hours as needed for wheezing or shortness of breath.    amitriptyline (ELAVIL) 10 MG tablet Take 1 tablet (10 mg total) by mouth 2 (two) times daily.    ARIPiprazole (ABILIFY) 5 MG tablet Take 5 mg by mouth daily.    Ascorbic Acid (VITAMIN C) 1000 MG tablet Take 1,000 mg by mouth daily.    aspirin EC 81 MG tablet Take 81 mg by mouth daily. Swallow whole. (Patient not taking: Reported on 11/26/2021) 11/15/2021: ON HOLD    atorvastatin (LIPITOR) 40 MG tablet Take 40 mg by mouth at bedtime.    busPIRone (BUSPAR) 15 MG tablet Take 1 tablet (15 mg total) by mouth 2 (two) times daily.    calcitRIOL (ROCALTROL) 0.25 MCG capsule Take 0.25 mcg by mouth every Monday, Wednesday, and Friday.    Cholecalciferol 25 MCG (1000 UT) tablet Take 1,000 Units by mouth daily.    cyanocobalamin (,VITAMIN B-12,) 1000 MCG/ML injection Inject 1,000 mcg into the muscle every 30 (thirty) days.    doxazosin (CARDURA) 4 MG tablet Take 4 mg by mouth daily.    DULoxetine (CYMBALTA) 60 MG capsule Take 1 capsule (60 mg total) by mouth daily.    ferrous sulfate 325 (65 FE) MG EC tablet Take 325 mg by mouth 2 (two) times daily.    furosemide (LASIX) 80 MG tablet Take 80 mg by mouth daily.    gabapentin (NEURONTIN) 100 MG capsule Take 2 capsules (200 mg total) by  mouth 2 (two) times daily.    glimepiride (AMARYL) 2 MG tablet Take 1 tablet (2 mg total) by mouth daily before breakfast.    hydrALAZINE (APRESOLINE) 50 MG tablet Take 50 mg by mouth 2 (two) times daily.    isosorbide mononitrate (IMDUR) 30 MG 24 hr tablet Take 30 mg by mouth every morning.    metoprolol succinate (TOPROL-XL) 50 MG 24 hr tablet Take 1 tablet (50 mg total) by mouth daily.    Multiple Vitamins-Minerals (HAIR/SKIN/NAILS/BIOTIN PO) Take 2 each by mouth daily.    Multiple Vitamins-Minerals (MULTIVITAMIN  WITH MINERALS) tablet Take 1 tablet by mouth daily.    mupirocin ointment (BACTROBAN) 2 % Apply 1 Application topically daily.    omeprazole (PRILOSEC) 20 MG capsule Take 20 mg by mouth daily.    Probiotic Product (PROBIOTIC PO) Take 1 capsule by mouth daily.    telmisartan (MICARDIS) 20 MG tablet Take 20 mg by mouth daily.    zolpidem (AMBIEN) 5 MG tablet Take 1 tablet (5 mg total) by mouth at bedtime.    No facility-administered encounter medications on file as of 12/13/2021.    ALLERGIES:  Allergies  Allergen Reactions   Amoxicillin Anaphylaxis   Clarithromycin Anaphylaxis, Diarrhea, Itching, Nausea And Vomiting and Rash   Codeine Itching   Hydrocodone Itching   Moxifloxacin Anaphylaxis, Anxiety, Hives, Itching, Nausea And Vomiting, Palpitations and Shortness Of Breath   Penicillins Anaphylaxis   Sulfa Antibiotics Anaphylaxis   Nitrofurantoin Nausea And Vomiting   Crab (Diagnostic) Hives   Latex Hives, Itching, Rash and Swelling     PHYSICAL EXAM:  ECOG PERFORMANCE STATUS: 2 - Symptomatic, <50% confined to bed  There were no vitals filed for this visit. There were no vitals filed for this visit. Physical Exam Constitutional:      Appearance: Normal appearance. She is obese.  HENT:     Head: Normocephalic and atraumatic.     Mouth/Throat:     Mouth: Mucous membranes are moist.  Eyes:     Extraocular Movements: Extraocular movements intact.     Pupils: Pupils  are equal, round, and reactive to light.  Cardiovascular:     Rate and Rhythm: Normal rate and regular rhythm.     Pulses: Normal pulses.     Heart sounds: Normal heart sounds.  Pulmonary:     Effort: Pulmonary effort is normal.     Breath sounds: Normal breath sounds.  Abdominal:     General: Bowel sounds are normal.     Palpations: Abdomen is soft.     Tenderness: There is no abdominal tenderness.  Musculoskeletal:        General: No swelling.     Right lower leg: No edema.     Left lower leg: No edema.  Lymphadenopathy:     Cervical: No cervical adenopathy.  Skin:    General: Skin is warm and dry.  Neurological:     General: No focal deficit present.     Mental Status: She is alert and oriented to person, place, and time.  Psychiatric:        Mood and Affect: Mood normal.        Behavior: Behavior normal.      LABORATORY DATA:  I have reviewed the labs as listed.  CBC    Component Value Date/Time   WBC 9.1 12/06/2021 1300   RBC 2.87 (L) 12/06/2021 1300   HGB 8.7 (L) 12/06/2021 1300   HCT 27.8 (L) 12/06/2021 1300   PLT 211 12/06/2021 1300   MCV 96.9 12/06/2021 1300   MCH 30.3 12/06/2021 1300   MCHC 31.3 12/06/2021 1300   RDW 16.6 (H) 12/06/2021 1300   LYMPHSABS 0.7 12/06/2021 1300   MONOABS 0.7 12/06/2021 1300   EOSABS 0.3 12/06/2021 1300   BASOSABS 0.1 12/06/2021 1300      Latest Ref Rng & Units 09/28/2021    8:40 AM 09/01/2021    3:46 AM 08/31/2021    4:31 AM  CMP  Glucose 70 - 99 mg/dL 99  108  103   BUN 6 - 20 mg/dL 29  32  39   Creatinine 0.44 - 1.00 mg/dL 2.45  2.40  2.79   Sodium 135 - 145 mmol/L 139  137  138   Potassium 3.5 - 5.1 mmol/L 4.1  4.0  3.4   Chloride 98 - 111 mmol/L 109  95  93   CO2 22 - 32 mmol/L 20  35  34   Calcium 8.9 - 10.3 mg/dL 8.5  7.9  7.6     DIAGNOSTIC IMAGING:  I have independently reviewed the relevant imaging and discussed with the patient.  ASSESSMENT & PLAN: 1.  Normocytic anemia: - Etiology: CKD stage IV,  relative iron deficiency, and mild copper deficiency - Has been taking iron tablet twice daily for 2 months since May 2023 - Denies BRBPR/melena.  Had total abdominal hysterectomy at 26 secondary to bleeding. - She had 3 blood transfusions in the last 6 months. - Hematology work-up (and nephrology labs via [facility]) June/July 2023:  Hgb 7.8/MCV 86.3.  Iron panel showed functional iron deficiency (ferritin 107, iron saturation 15% with low TIBC 207) Creatinine 2.26/GFR 25 (baseline CKD stage IV) Negative haptoglobin/DAT, normal reticulocytes, mildly elevated LDH 201.   SPEP negative for monoclonal protein.  Immunofixation shows polyclonal immunoglobulins.  Elevated kappa 135.3, elevated lambda 99.5, normal ratio 1.36 in keeping with CKD stage IV. Normal B12, MMA, folate. Copper deficiency (70) - Received IV Feraheme on 11/07/2021 and 11/14/2021 - Most recent labs (12/06/2021): Hgb minimally improved at 8.7/MCV 96.9, ferritin 268 with 19% iron saturation.   -Persistent fatigue, headaches, RLS - PLAN: Recommend additional IV Venofer 400 mg x 1 (with PREMEDS due to multiple medication allergies and autoimmune disease) - Start taking OTC copper 2 mg daily - We will repeat iron panel, copper and ceruloplasmin in 3 months.  (November/December 2023) - We will start patient on Retacrit 20,000 units every 2 weeks (pending prior authorization) - Repeat CBC with office visit in 1 month (September/October 2023)   2.  Other PMH - History of CIDP (chronic inflammatory demyelinating polyneuropathy): Was on IVIG for many years, last infusion 1 year ago.  Trying to find a neurologist locally. - History of melanoma resected from left leg and left breast in 2878 - Systolic CHF (EF 40 to 67%) - CKD stage IV (kidney biopsy on 11/20/2021, results pending) - COPD - Type 2 diabetes mellitus - CAD with LAD stent - Fibromyalgia  3.  Social/family history: - She is seen with her husband today.  She moved to [place]  from [place] [facility].  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  Current active smoker half pack per day for 42 years. - Sister died of lung cancer.  Mother died of breast cancer.  Father had prostate cancer.  Maternal grandfather had pancreatic cancer.   All questions were answered. The patient knows to call the clinic with any problems, questions or concerns.  Medical decision making: Moderate  Time spent on visit: I spent 20 minutes counseling the patient face to face. The total time spent in the appointment was 30 minutes and more than 50% was on counseling.   Tracy Rush, PA-C  12/13/2021 2:37 PM

## 2021-12-13 ENCOUNTER — Inpatient Hospital Stay: Payer: 59 | Admitting: Physician Assistant

## 2021-12-13 VITALS — BP 148/74 | HR 75 | Temp 98.1°F | Resp 18 | Ht 68.0 in | Wt 203.7 lb

## 2021-12-13 DIAGNOSIS — N179 Acute kidney failure, unspecified: Secondary | ICD-10-CM | POA: Diagnosis not present

## 2021-12-13 DIAGNOSIS — E610 Copper deficiency: Secondary | ICD-10-CM | POA: Diagnosis not present

## 2021-12-13 DIAGNOSIS — D509 Iron deficiency anemia, unspecified: Secondary | ICD-10-CM | POA: Diagnosis not present

## 2021-12-13 DIAGNOSIS — D631 Anemia in chronic kidney disease: Secondary | ICD-10-CM | POA: Diagnosis not present

## 2021-12-13 DIAGNOSIS — N184 Chronic kidney disease, stage 4 (severe): Secondary | ICD-10-CM | POA: Diagnosis not present

## 2021-12-13 DIAGNOSIS — E875 Hyperkalemia: Secondary | ICD-10-CM | POA: Diagnosis not present

## 2021-12-13 MED ORDER — COPPER 2 MG PO TABS: 2.0000 mg | Freq: Every day | 3 refills | Status: DC

## 2021-12-13 NOTE — Patient Instructions (Addendum)
[place] at [place] **   You were seen today by Tarri Abernethy PA-C for your anemia.    ANEMIA: Your anemia is primarily related to chronic kidney disease, which affects your body's ability to make healthy blood cells and also affects your body's ability to process and use iron. DECREASE your iron tablet to once daily.  This is easier for your body to absorb. We will schedule you for IV iron x1 dose. We will schedule you for Retacrit injections to help your body make more blood.  Main side effects of Retacrit injections includes elevated blood pressure and blood clots.  Please see the attached handout for further information. You will need to have your blood labs checked on the same day as each of your injections.  These labs will be done about 1 hour prior to your injection appointment.  COPPER DEFICIENCY: You have mild copper deficiency. Start taking copper supplement 2 mg daily.  (Prescription sent to pharmacy, but may also be available over-the-counter)  FOLLOW-UP APPOINTMENT: Same-day labs and office visit in about 1 month (2 weeks after your second Retacrit injection)  ** Thank you for trusting me with your healthcare!  I strive to provide all of my patients with quality care at each visit.  If you receive a survey for this visit, I would be so grateful to you for taking the time to provide feedback.  Thank you in advance!  ~ Daymien Goth                   Dr. Derek Jack   &   Tarri Abernethy, PA-C   - - - - - - - - - - - - - - - - - -    Thank you for choosing [place] at [facility] to provide your oncology and hematology care.  To afford each patient quality time with our provider, please arrive at least 15 minutes before your scheduled appointment time.   If you have a lab appointment with the  please come in thru the Main Entrance and check in at the main  information desk.  You need to re-schedule your appointment should you arrive 10 or more minutes late.  We strive to give you quality time with our providers, and arriving late affects you and other patients whose appointments are after yours.  Also, if you no show three or more times for appointments you may be dismissed from the clinic at the providers discretion.     Again, thank you for choosing [facility].  Our hope is that these requests will decrease the amount of time that you wait before being seen by our physicians.       _____________________________________________________________  Should you have questions after your visit to [facility], please contact our office at [phone] and follow the prompts.  Our office hours are 8:00 a.m. and 4:30 p.m. Monday - Friday.  Please note that voicemails left after 4:00 p.m. may not be returned until the following business day.  We are closed weekends and major holidays.  You do have access to a nurse 24-7, just call the main number to the clinic [phone] and do not press any options, hold on the line and a nurse will answer the phone.    For prescription refill requests, have your pharmacy contact our office and allow 72 hours.

## 2021-12-16 ENCOUNTER — Inpatient Hospital Stay (HOSPITAL_COMMUNITY): Payer: 59

## 2021-12-16 ENCOUNTER — Other Ambulatory Visit: Payer: Self-pay

## 2021-12-16 ENCOUNTER — Encounter (HOSPITAL_COMMUNITY): Payer: Self-pay

## 2021-12-16 ENCOUNTER — Emergency Department (HOSPITAL_COMMUNITY): Payer: 59

## 2021-12-16 ENCOUNTER — Inpatient Hospital Stay (HOSPITAL_COMMUNITY)
Admission: EM | Admit: 2021-12-16 | Discharge: 2021-12-22 | DRG: 674 | Disposition: A | Discharge status: Home Health | Payer: 59 | Attending: Internal Medicine | Admitting: Internal Medicine

## 2021-12-16 DIAGNOSIS — E1140 Type 2 diabetes mellitus with diabetic neuropathy, unspecified: Secondary | ICD-10-CM | POA: Diagnosis not present

## 2021-12-16 DIAGNOSIS — E871 Hypo-osmolality and hyponatremia: Secondary | ICD-10-CM | POA: Diagnosis not present

## 2021-12-16 DIAGNOSIS — Z885 Allergy status to narcotic agent status: Secondary | ICD-10-CM

## 2021-12-16 DIAGNOSIS — Z0389 Encounter for observation for other suspected diseases and conditions ruled out: Secondary | ICD-10-CM | POA: Diagnosis not present

## 2021-12-16 DIAGNOSIS — E1165 Type 2 diabetes mellitus with hyperglycemia: Secondary | ICD-10-CM | POA: Diagnosis not present

## 2021-12-16 DIAGNOSIS — J449 Chronic obstructive pulmonary disease, unspecified: Secondary | ICD-10-CM | POA: Diagnosis not present

## 2021-12-16 DIAGNOSIS — R339 Retention of urine, unspecified: Secondary | ICD-10-CM | POA: Diagnosis not present

## 2021-12-16 DIAGNOSIS — Z880 Allergy status to penicillin: Secondary | ICD-10-CM

## 2021-12-16 DIAGNOSIS — G43909 Migraine, unspecified, not intractable, without status migrainosus: Secondary | ICD-10-CM | POA: Diagnosis not present

## 2021-12-16 DIAGNOSIS — I10 Essential (primary) hypertension: Secondary | ICD-10-CM | POA: Diagnosis not present

## 2021-12-16 DIAGNOSIS — E785 Hyperlipidemia, unspecified: Secondary | ICD-10-CM | POA: Diagnosis not present

## 2021-12-16 DIAGNOSIS — I5040 Unspecified combined systolic (congestive) and diastolic (congestive) heart failure: Secondary | ICD-10-CM | POA: Diagnosis not present

## 2021-12-16 DIAGNOSIS — F419 Anxiety disorder, unspecified: Secondary | ICD-10-CM | POA: Diagnosis present

## 2021-12-16 DIAGNOSIS — D649 Anemia, unspecified: Secondary | ICD-10-CM | POA: Diagnosis not present

## 2021-12-16 DIAGNOSIS — N184 Chronic kidney disease, stage 4 (severe): Secondary | ICD-10-CM | POA: Diagnosis not present

## 2021-12-16 DIAGNOSIS — I959 Hypotension, unspecified: Secondary | ICD-10-CM | POA: Diagnosis not present

## 2021-12-16 DIAGNOSIS — I509 Heart failure, unspecified: Secondary | ICD-10-CM | POA: Diagnosis not present

## 2021-12-16 DIAGNOSIS — R112 Nausea with vomiting, unspecified: Secondary | ICD-10-CM

## 2021-12-16 DIAGNOSIS — E1122 Type 2 diabetes mellitus with diabetic chronic kidney disease: Secondary | ICD-10-CM | POA: Diagnosis not present

## 2021-12-16 DIAGNOSIS — I5042 Chronic combined systolic (congestive) and diastolic (congestive) heart failure: Secondary | ICD-10-CM

## 2021-12-16 DIAGNOSIS — E8729 Other acidosis: Secondary | ICD-10-CM | POA: Diagnosis present

## 2021-12-16 DIAGNOSIS — I252 Old myocardial infarction: Secondary | ICD-10-CM

## 2021-12-16 DIAGNOSIS — Z91013 Allergy to seafood: Secondary | ICD-10-CM

## 2021-12-16 DIAGNOSIS — F319 Bipolar disorder, unspecified: Secondary | ICD-10-CM | POA: Diagnosis present

## 2021-12-16 DIAGNOSIS — R7989 Other specified abnormal findings of blood chemistry: Secondary | ICD-10-CM | POA: Diagnosis present

## 2021-12-16 DIAGNOSIS — F418 Other specified anxiety disorders: Secondary | ICD-10-CM

## 2021-12-16 DIAGNOSIS — N186 End stage renal disease: Secondary | ICD-10-CM | POA: Diagnosis present

## 2021-12-16 DIAGNOSIS — Z4901 Encounter for fitting and adjustment of extracorporeal dialysis catheter: Secondary | ICD-10-CM | POA: Diagnosis not present

## 2021-12-16 DIAGNOSIS — E162 Hypoglycemia, unspecified: Secondary | ICD-10-CM | POA: Diagnosis not present

## 2021-12-16 DIAGNOSIS — Z90710 Acquired absence of both cervix and uterus: Secondary | ICD-10-CM

## 2021-12-16 DIAGNOSIS — N189 Chronic kidney disease, unspecified: Secondary | ICD-10-CM | POA: Diagnosis not present

## 2021-12-16 DIAGNOSIS — E11649 Type 2 diabetes mellitus with hypoglycemia without coma: Secondary | ICD-10-CM | POA: Diagnosis present

## 2021-12-16 DIAGNOSIS — Z881 Allergy status to other antibiotic agents status: Secondary | ICD-10-CM

## 2021-12-16 DIAGNOSIS — R404 Transient alteration of awareness: Secondary | ICD-10-CM | POA: Diagnosis not present

## 2021-12-16 DIAGNOSIS — E872 Acidosis, unspecified: Secondary | ICD-10-CM | POA: Diagnosis not present

## 2021-12-16 DIAGNOSIS — I132 Hypertensive heart and chronic kidney disease with heart failure and with stage 5 chronic kidney disease, or end stage renal disease: Secondary | ICD-10-CM | POA: Diagnosis not present

## 2021-12-16 DIAGNOSIS — I130 Hypertensive heart and chronic kidney disease with heart failure and stage 1 through stage 4 chronic kidney disease, or unspecified chronic kidney disease: Secondary | ICD-10-CM | POA: Diagnosis not present

## 2021-12-16 DIAGNOSIS — N179 Acute kidney failure, unspecified: Secondary | ICD-10-CM | POA: Diagnosis not present

## 2021-12-16 DIAGNOSIS — F17210 Nicotine dependence, cigarettes, uncomplicated: Secondary | ICD-10-CM | POA: Diagnosis present

## 2021-12-16 DIAGNOSIS — R778 Other specified abnormalities of plasma proteins: Secondary | ICD-10-CM | POA: Diagnosis not present

## 2021-12-16 DIAGNOSIS — Z7982 Long term (current) use of aspirin: Secondary | ICD-10-CM

## 2021-12-16 DIAGNOSIS — G6181 Chronic inflammatory demyelinating polyneuritis: Secondary | ICD-10-CM

## 2021-12-16 DIAGNOSIS — Z981 Arthrodesis status: Secondary | ICD-10-CM

## 2021-12-16 DIAGNOSIS — I2510 Atherosclerotic heart disease of native coronary artery without angina pectoris: Secondary | ICD-10-CM | POA: Diagnosis present

## 2021-12-16 DIAGNOSIS — Z20822 Contact with and (suspected) exposure to covid-19: Secondary | ICD-10-CM | POA: Diagnosis present

## 2021-12-16 DIAGNOSIS — E875 Hyperkalemia: Secondary | ICD-10-CM | POA: Diagnosis not present

## 2021-12-16 DIAGNOSIS — A419 Sepsis, unspecified organism: Secondary | ICD-10-CM | POA: Diagnosis not present

## 2021-12-16 DIAGNOSIS — I25119 Atherosclerotic heart disease of native coronary artery with unspecified angina pectoris: Secondary | ICD-10-CM | POA: Diagnosis not present

## 2021-12-16 DIAGNOSIS — Z743 Need for continuous supervision: Secondary | ICD-10-CM | POA: Diagnosis not present

## 2021-12-16 DIAGNOSIS — R69 Illness, unspecified: Secondary | ICD-10-CM | POA: Diagnosis not present

## 2021-12-16 DIAGNOSIS — K219 Gastro-esophageal reflux disease without esophagitis: Secondary | ICD-10-CM | POA: Diagnosis not present

## 2021-12-16 DIAGNOSIS — Z882 Allergy status to sulfonamides status: Secondary | ICD-10-CM

## 2021-12-16 DIAGNOSIS — Z79899 Other long term (current) drug therapy: Secondary | ICD-10-CM

## 2021-12-16 DIAGNOSIS — Z9049 Acquired absence of other specified parts of digestive tract: Secondary | ICD-10-CM

## 2021-12-16 DIAGNOSIS — Z888 Allergy status to other drugs, medicaments and biological substances status: Secondary | ICD-10-CM

## 2021-12-16 DIAGNOSIS — D631 Anemia in chronic kidney disease: Secondary | ICD-10-CM | POA: Diagnosis present

## 2021-12-16 DIAGNOSIS — Z91040 Latex allergy status: Secondary | ICD-10-CM

## 2021-12-16 DIAGNOSIS — K529 Noninfective gastroenteritis and colitis, unspecified: Secondary | ICD-10-CM | POA: Diagnosis present

## 2021-12-16 DIAGNOSIS — Z955 Presence of coronary angioplasty implant and graft: Secondary | ICD-10-CM

## 2021-12-16 DIAGNOSIS — R531 Weakness: Secondary | ICD-10-CM | POA: Diagnosis not present

## 2021-12-16 DIAGNOSIS — E1110 Type 2 diabetes mellitus with ketoacidosis without coma: Secondary | ICD-10-CM | POA: Diagnosis not present

## 2021-12-16 DIAGNOSIS — M797 Fibromyalgia: Secondary | ICD-10-CM | POA: Diagnosis present

## 2021-12-16 HISTORY — DX: Other specified anxiety disorders: F41.8

## 2021-12-16 LAB — CBG MONITORING, ED
Glucose-Capillary: 79 mg/dL (ref 70–99)
Glucose-Capillary: 59 mg/dL — ABNORMAL LOW (ref 70–99)
Glucose-Capillary: 153 mg/dL — ABNORMAL HIGH (ref 70–99)
Glucose-Capillary: 115 mg/dL — ABNORMAL HIGH (ref 70–99)

## 2021-12-16 LAB — HEMOGLOBIN A1C
Hgb A1c MFr Bld: 6.3 % — ABNORMAL HIGH (ref 4.8–5.6)
Mean Plasma Glucose: 134.11 mg/dL

## 2021-12-16 LAB — COMPREHENSIVE METABOLIC PANEL
ALT: 34 U/L (ref 0–44)
AST: 27 U/L (ref 15–41)
Albumin: 3 g/dL — ABNORMAL LOW (ref 3.5–5.0)
Alkaline Phosphatase: 149 U/L — ABNORMAL HIGH (ref 38–126)
Anion gap: 9 (ref 5–15)
BUN: 78 mg/dL — ABNORMAL HIGH (ref 6–20)
CO2: 11 mmol/L — ABNORMAL LOW (ref 22–32)
Calcium: 7.8 mg/dL — ABNORMAL LOW (ref 8.9–10.3)
Chloride: 111 mmol/L (ref 98–111)
Creatinine, Ser: 5.9 mg/dL — ABNORMAL HIGH (ref 0.44–1.00)
GFR, Estimated: 8 mL/min — ABNORMAL LOW (ref >60)
Glucose, Bld: 51 mg/dL — ABNORMAL LOW (ref 70–99)
Potassium: 5.3 mmol/L — ABNORMAL HIGH (ref 3.5–5.1)
Sodium: 131 mmol/L — ABNORMAL LOW (ref 135–145)
Total Bilirubin: 0.6 mg/dL (ref 0.3–1.2)
Total Protein: 6.7 g/dL (ref 6.5–8.1)

## 2021-12-16 LAB — CBC WITH DIFFERENTIAL/PLATELET
Abs Immature Granulocytes: 0.17 10*3/uL — ABNORMAL HIGH (ref 0.00–0.07)
Basophils Absolute: 0 10*3/uL (ref 0.0–0.1)
Basophils Relative: 0 %
Eosinophils Absolute: 0.1 10*3/uL (ref 0.0–0.5)
Eosinophils Relative: 1 %
HCT: 27.1 % — ABNORMAL LOW (ref 36.0–46.0)
Hemoglobin: 8.4 g/dL — ABNORMAL LOW (ref 12.0–15.0)
Immature Granulocytes: 2 %
Lymphocytes Relative: 3 %
Lymphs Abs: 0.3 10*3/uL — ABNORMAL LOW (ref 0.7–4.0)
MCH: 30.5 pg (ref 26.0–34.0)
MCHC: 31 g/dL (ref 30.0–36.0)
MCV: 98.5 fL (ref 80.0–100.0)
Monocytes Absolute: 0.9 10*3/uL (ref 0.1–1.0)
Monocytes Relative: 8 %
Neutro Abs: 9.9 10*3/uL — ABNORMAL HIGH (ref 1.7–7.7)
Neutrophils Relative %: 86 %
Platelets: 203 10*3/uL (ref 150–400)
RBC: 2.75 MIL/uL — ABNORMAL LOW (ref 3.87–5.11)
RDW: 16.1 % — ABNORMAL HIGH (ref 11.5–15.5)
WBC: 11.4 10*3/uL — ABNORMAL HIGH (ref 4.0–10.5)
nRBC: 0 % (ref 0.0–0.2)

## 2021-12-16 LAB — CK: Total CK: 166 U/L (ref 38–234)

## 2021-12-16 LAB — D-DIMER, QUANTITATIVE: D-Dimer, Quant: 1.66 ug/mL-FEU — ABNORMAL HIGH (ref 0.00–0.50)

## 2021-12-16 LAB — GLUCOSE, CAPILLARY: Glucose-Capillary: 86 mg/dL (ref 70–99)

## 2021-12-16 LAB — TROPONIN I (HIGH SENSITIVITY)
Troponin I (High Sensitivity): 164 ng/L (ref <18)
Troponin I (High Sensitivity): 154 ng/L (ref <18)

## 2021-12-16 LAB — LACTIC ACID, PLASMA
Lactic Acid, Venous: 0.5 mmol/L (ref 0.5–1.9)
Lactic Acid, Venous: 0.8 mmol/L (ref 0.5–1.9)

## 2021-12-16 LAB — RESP PANEL BY RT-PCR (FLU A&B, COVID) ARPGX2
Influenza A by PCR: NEGATIVE
Influenza B by PCR: NEGATIVE
SARS Coronavirus 2 by RT PCR: NEGATIVE

## 2021-12-16 LAB — MAGNESIUM: Magnesium: 1.8 mg/dL (ref 1.7–2.4)

## 2021-12-16 LAB — PHOSPHORUS: Phosphorus: 8.7 mg/dL — ABNORMAL HIGH (ref 2.5–4.6)

## 2021-12-16 MED ORDER — ONDANSETRON HCL 4 MG/2ML IJ SOLN
4.0000 mg | Freq: Four times a day (QID) | INTRAMUSCULAR | Status: DC | PRN
  Administered 2021-12-19 – 2021-12-21 (×5): 4 mg via INTRAVENOUS
  Filled 2021-12-16 (×5): qty 2

## 2021-12-16 MED ORDER — ONDANSETRON HCL 4 MG/2ML IJ SOLN
4.0000 mg | Freq: Once | INTRAMUSCULAR | Status: AC
  Administered 2021-12-16: 4 mg via INTRAVENOUS
  Filled 2021-12-16: qty 2

## 2021-12-16 MED ORDER — METOCLOPRAMIDE HCL 5 MG/ML IJ SOLN
10.0000 mg | Freq: Once | INTRAMUSCULAR | Status: AC
  Administered 2021-12-16: 10 mg via INTRAVENOUS
  Filled 2021-12-16: qty 2

## 2021-12-16 MED ORDER — ACETAMINOPHEN 650 MG RE SUPP: 650.0000 mg | Freq: Four times a day (QID) | RECTAL | Status: DC | PRN

## 2021-12-16 MED ORDER — SODIUM ZIRCONIUM CYCLOSILICATE 5 G PO PACK
10.0000 g | PACK | Freq: Once | ORAL | Status: AC
  Administered 2021-12-16: 10 g via ORAL
  Filled 2021-12-16: qty 2

## 2021-12-16 MED ORDER — PANTOPRAZOLE SODIUM 40 MG PO TBEC
40.0000 mg | DELAYED_RELEASE_TABLET | Freq: Two times a day (BID) | ORAL | Status: DC
  Administered 2021-12-16 – 2021-12-22 (×12): 40 mg via ORAL
  Filled 2021-12-16 (×11): qty 1

## 2021-12-16 MED ORDER — FUROSEMIDE 10 MG/ML IJ SOLN
20.0000 mg | Freq: Once | INTRAMUSCULAR | Status: AC
Start: 2021-12-16 — End: 2021-12-16
  Administered 2021-12-16: 20 mg via INTRAVENOUS

## 2021-12-16 MED ORDER — HEPARIN (PORCINE) 25000 UT/250ML-% IV SOLN
1100.0000 [IU]/h | INTRAVENOUS | Status: DC
Start: 2021-12-16 — End: 2021-12-16
  Administered 2021-12-16: 1100 [IU]/h via INTRAVENOUS
  Filled 2021-12-16: qty 250

## 2021-12-16 MED ORDER — DEXTROSE 50 % IV SOLN
INTRAVENOUS | Status: AC
  Administered 2021-12-17: 50 mL
  Filled 2021-12-16: qty 50

## 2021-12-16 MED ORDER — ALBUTEROL SULFATE (2.5 MG/3ML) 0.083% IN NEBU: 2.5000 mg | INHALATION_SOLUTION | Freq: Four times a day (QID) | RESPIRATORY_TRACT | Status: DC | PRN

## 2021-12-16 MED ORDER — SODIUM BICARBONATE 8.4 % IV SOLN
INTRAVENOUS | Status: DC
  Filled 2021-12-16 (×9): qty 1000

## 2021-12-16 MED ORDER — BUSPIRONE HCL 5 MG PO TABS
15.0000 mg | ORAL_TABLET | Freq: Two times a day (BID) | ORAL | Status: DC
  Administered 2021-12-16 – 2021-12-19 (×6): 15 mg via ORAL
  Filled 2021-12-16 (×6): qty 3

## 2021-12-16 MED ORDER — ARIPIPRAZOLE 5 MG PO TABS
5.0000 mg | ORAL_TABLET | Freq: Every day | ORAL | Status: DC
  Administered 2021-12-16 – 2021-12-22 (×7): 5 mg via ORAL
  Filled 2021-12-16 (×7): qty 1

## 2021-12-16 MED ORDER — PANTOPRAZOLE SODIUM 40 MG IV SOLR
40.0000 mg | Freq: Two times a day (BID) | INTRAVENOUS | Status: DC
  Administered 2021-12-16: 40 mg via INTRAVENOUS
  Filled 2021-12-16: qty 10

## 2021-12-16 MED ORDER — GABAPENTIN 300 MG PO CAPS
300.0000 mg | ORAL_CAPSULE | Freq: Every day | ORAL | Status: DC
  Administered 2021-12-16 – 2021-12-21 (×6): 300 mg via ORAL
  Filled 2021-12-16 (×6): qty 1

## 2021-12-16 MED ORDER — ACETAMINOPHEN 325 MG PO TABS
650.0000 mg | ORAL_TABLET | Freq: Four times a day (QID) | ORAL | Status: DC | PRN
  Administered 2021-12-16 – 2021-12-20 (×5): 650 mg via ORAL
  Filled 2021-12-16 (×5): qty 2

## 2021-12-16 MED ORDER — DIPHENHYDRAMINE HCL 50 MG/ML IJ SOLN
12.5000 mg | Freq: Once | INTRAMUSCULAR | Status: AC
  Administered 2021-12-16: 12.5 mg via INTRAVENOUS
  Filled 2021-12-16: qty 1

## 2021-12-16 MED ORDER — ASPIRIN 81 MG PO CHEW
324.0000 mg | CHEWABLE_TABLET | Freq: Once | ORAL | Status: AC
  Administered 2021-12-16: 324 mg via ORAL
  Filled 2021-12-16: qty 4

## 2021-12-16 MED ORDER — HEPARIN BOLUS VIA INFUSION
4000.0000 [IU] | Freq: Once | INTRAVENOUS | Status: AC
  Administered 2021-12-16: 4000 [IU] via INTRAVENOUS

## 2021-12-16 MED ORDER — DEXTROSE 50 % IV SOLN
25.0000 mL | Freq: Once | INTRAVENOUS | Status: DC
  Filled 2021-12-16: qty 50

## 2021-12-16 MED ORDER — ACETAMINOPHEN 325 MG PO TABS
650.0000 mg | ORAL_TABLET | Freq: Once | ORAL | Status: AC
  Administered 2021-12-16: 650 mg via ORAL
  Filled 2021-12-16: qty 2

## 2021-12-16 MED ORDER — DULOXETINE HCL 60 MG PO CPEP
60.0000 mg | ORAL_CAPSULE | Freq: Every day | ORAL | Status: DC
  Administered 2021-12-16 – 2021-12-19 (×4): 60 mg via ORAL
  Filled 2021-12-16 (×3): qty 1
  Filled 2021-12-16: qty 2

## 2021-12-16 MED ORDER — FUROSEMIDE 10 MG/ML IJ SOLN
40.0000 mg | Freq: Once | INTRAMUSCULAR | Status: DC
  Filled 2021-12-16: qty 4

## 2021-12-16 MED ORDER — ATORVASTATIN CALCIUM 40 MG PO TABS
40.0000 mg | ORAL_TABLET | Freq: Every day | ORAL | Status: DC
  Administered 2021-12-16 – 2021-12-21 (×6): 40 mg via ORAL
  Filled 2021-12-16 (×6): qty 1

## 2021-12-16 MED ORDER — ASPIRIN 81 MG PO TBEC
81.0000 mg | DELAYED_RELEASE_TABLET | Freq: Every day | ORAL | Status: DC
  Administered 2021-12-17 – 2021-12-22 (×6): 81 mg via ORAL
  Filled 2021-12-16 (×7): qty 1

## 2021-12-16 MED ORDER — SODIUM CHLORIDE 0.9 % IV BOLUS
500.0000 mL | Freq: Once | INTRAVENOUS | Status: AC
  Administered 2021-12-16: 500 mL via INTRAVENOUS

## 2021-12-16 NOTE — Assessment & Plan Note (Signed)
-  no SI or hallucinations -resume home antidepressant and anxiolytics meds.

## 2021-12-16 NOTE — Assessment & Plan Note (Signed)
-   Presenting with severe hypoglycemia -At this moment holding oral hypoglycemic agents -Patient experiencing ongoing nausea/vomiting and having difficulty taking by mouth. -Follow CBGs every 4 hours -Receiving IV dextrose along with sodium bicarbonate. -Update A1c

## 2021-12-16 NOTE — Progress Notes (Signed)
ANTICOAGULATION CONSULT NOTE - Initial Consult  Pharmacy Consult for heparin Indication: chest pain/ACS  Allergies  Allergen Reactions   Amoxicillin Anaphylaxis   Clarithromycin Anaphylaxis, Diarrhea, Itching, Nausea And Vomiting and Rash   Codeine Itching   Hydrocodone Itching   Moxifloxacin Anaphylaxis, Anxiety, Hives, Itching, Nausea And Vomiting, Palpitations and Shortness Of Breath   Penicillins Anaphylaxis   Sulfa Antibiotics Anaphylaxis   Nitrofurantoin Nausea And Vomiting   Crab (Diagnostic) Hives   Latex Hives, Itching, Rash and Swelling    Patient Measurements: Height: 5\' 8"  (172.7 cm) Weight: 90.7 kg (200 lb) IBW/kg (Calculated) : 63.9 Heparin Dosing Weight: 83 kg  Vital Signs: Temp: 97.6 F (36.4 C) (09/03 1000) Temp Source: Oral (09/03 1000) BP: 131/51 (09/03 1033) Pulse Rate: 66 (09/03 1033)  Labs: Recent Labs    12/16/21 1131  HGB 8.4*  HCT 27.1*  PLT 203  CREATININE 5.90*  TROPONINIHS 164*    Estimated Creatinine Clearance: 12.8 mL/min (A) (by C-G formula based on SCr of 5.9 mg/dL (H)).   Medical History: Past Medical History:  Diagnosis Date   Anemia    Anxiety    Asthma    Bipolar disorder ([place]) 2018   type 2   CHF (congestive heart failure) (HCC)    CIDP (chronic inflammatory demyelinating polyneuropathy) (HCC)    CKD (chronic kidney disease)    COPD (chronic obstructive pulmonary disease) (HCC)    Depression    Diabetes mellitus without complication (HCC)    type 2   Diabetic foot ulcers (HCC)    Fibromyalgia    GERD (gastroesophageal reflux disease)    Hepatitis    Hep as a child - patient can give blood   History of blood transfusion 08/2021   1 unit per patient   History of esophagogastroduodenoscopy (EGD) 2018   History of kidney stones    passed stones   HLD (hyperlipidemia)    Hypertension    Migraines    Myocardial infarction (HCC)    x 2   Neuropathy    legs and feet - bilateral    Medications:  (Not in a  hospital admission)   Assessment: Pharmacy consulted to dose heparin in patient with chest pain/ACS.  Patient is not on anticoagulation prior to admission.  Hgb 8.4 Trop 1.64 D-Dimer 1.66  Goal of Therapy:  Heparin level 0.3-0.7 units/ml Monitor platelets by anticoagulation protocol: Yes   Plan:  Give 4000 units bolus x 1 Start heparin infusion at 1100 units/hr Check anti-Xa level in 8 hours and daily while on heparin Continue to monitor H&H and platelets  Margot Ables, PharmD Clinical Pharmacist 12/16/2021 12:38 PM

## 2021-12-16 NOTE — ED Triage Notes (Signed)
Pt arrived via [facility] from home c/o hypoglycemia. Hx of type 2 diabetes, CHF, asthma, and MI. Per EMS, cbg was originally 20, then they got it up to 116.

## 2021-12-16 NOTE — Assessment & Plan Note (Signed)
-   Currently holding antihypertensives and diuretics agents in the setting of soft blood pressure and worsening renal function -Follow daily weights and strict intake and output -Most recent echo with ejection fraction 40-45% (March 2023). -Cardiology was consulted and expressed that they can follow patient when she arrived to come in consultation.

## 2021-12-16 NOTE — Assessment & Plan Note (Signed)
-   Treated with sodium bicarbonate, IV fluids, Lasix x1 dose and Lokelma -Most likely in the setting of worsening renal function -Follow electrolytes trend -Continue telemetry monitoring.

## 2021-12-16 NOTE — Assessment & Plan Note (Signed)
-   Most likely associated with hypoglycemic event -Will provide as needed antiemetics -Continue PPI -Follow clinical response maintain adequate hydration.

## 2021-12-16 NOTE — Assessment & Plan Note (Addendum)
-   Currently denied chest pain, but expressing shortness of breath -Patient with significant risk factors and elevated troponin on presentation -Case was briefly discussed with cardiologist on-call (Dr. Terrence Dupont) who felt patient troponin elevation most likely associated with worsening renal function. -Patient was initially started on heparin drip by EDP, second troponin has started to trend down -No acute ischemic changes on EKG. -Continue telemetry monitoring -Adequately continue to replete electrolytes -Follow daily weights, strict I's and O's and cardiology will see patient in consultation when she arrived to come. -Patient with recent echocardiogram in March 2023 demonstrating ejection fraction 40-45%; will hold on repeating echo at this moment and allow cardiology to determine if needed to further evaluate for any wall motion abnormalities. -continue ASA and statin

## 2021-12-16 NOTE — Assessment & Plan Note (Signed)
-   In the setting of worsening renal function -Patient has been started on sodium bicarbonate -Follow bicarb levels and renal function trend and stability.

## 2021-12-16 NOTE — H&P (Signed)
History and Physical    Patient: Tracy Walsh YKD:[id number] DOB: [birth year] DOA: 12/16/2021 DOS: the patient was seen and examined on 12/16/2021 PCP: [name], MD  Patient coming from: Home  Chief Complaint:  Chief Complaint  Patient presents with   Hypoglycemia   HPI: Tracy Walsh is a 56 y.o. 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  Patient reports some decrease in her oral intake will continue taking medications and noticing a low readings on her glucometer along with symptoms of headaches, nausea/vomiting and generalized weakness.  Despite attempting to eat something at home symptoms worsen and patient reported associated shortness of breath.  EMS was called.  On their arrival patient's CBGs were noticed to be in the 20s range; D50 was given with improvement and patient was transferred to the emergency department for further evaluation and management. Patient reports no fever or chills, no frank chest pain, no abdominal pain, no hematemesis, no melena or hematochezia.  There has not been any focal weakness.  She denies sick contacts.  On further investigation patient expressed noticing decrease in her urine output despite adequate hydration 2 to 3 days prior to admission and continues use of her Lasix.   Work-up in the ED demonstrated severe metabolic acidosis, hyperkalemia, worsening renal function with a creatinine up to 5.90 and continue hypoglycemia.  Patient continues expressing nausea, headache, was noticed to continue to be hypoglycemic and ended demonstrating elevated troponin.  EKG without acute ischemic changes.  Discussions with nephrology and cardiology on-call has recommended admission  and transfer to [facility] for further evaluation and management.   Review of Systems: As mentioned in the history of present illness. All other systems reviewed and are negative.  Past Medical History:  Diagnosis Date   Anemia    Anxiety    Asthma    Bipolar disorder ([place]) 2018   type 2   CHF (congestive heart failure) (HCC)    CIDP (chronic inflammatory demyelinating polyneuropathy) (HCC)    CKD (chronic kidney disease)    COPD (chronic obstructive pulmonary disease) (HCC)    Depression    Diabetes mellitus without complication (HCC)    type 2   Diabetic foot ulcers (HCC)    Fibromyalgia    GERD (gastroesophageal reflux disease)    Hepatitis    Hep as a child - patient can give blood   History of blood transfusion 08/2021   1 unit per patient   History of esophagogastroduodenoscopy (EGD) 2018   History of kidney stones    passed stones   HLD (hyperlipidemia)    Hypertension    Migraines    Myocardial infarction (HCC)    x 2   Neuropathy    legs and feet - bilateral   Past Surgical History:  Procedure Laterality Date   ABDOMINAL HYSTERECTOMY     APPENDECTOMY     BILATERAL NASAL FRACTURE CLOSED REDUCTION  10/2020   CHOLECYSTECTOMY     COLONOSCOPY     CORONARY ANGIOPLASTY WITH STENT PLACEMENT  01/15/2021   FOOT SURGERY Right    I & D   I & D EXTREMITY Left 09/28/2021   Procedure: LEFT HEEL DEBRIDEMENT AND TISSUE GRAFT;  Surgeon: Newt Minion, MD;  Location: [facility];  Service: Orthopedics;  Laterality: Left;   SHOULDER SURGERY Bilateral  SPINAL FUSION     UPPER GI ENDOSCOPY     Social History:  reports that she has been smoking cigarettes. She has been smoking an average of .5 packs per day. She has never used smokeless tobacco. She reports that she does not drink alcohol and does not use drugs.  Allergies  Allergen Reactions   Amoxicillin Anaphylaxis   Clarithromycin Anaphylaxis, Diarrhea, Itching, Nausea And Vomiting and Rash   Codeine Itching    Hydrocodone Itching   Moxifloxacin Anaphylaxis, Anxiety, Hives, Itching, Nausea And Vomiting, Palpitations and Shortness Of Breath   Penicillins Anaphylaxis   Sulfa Antibiotics Anaphylaxis   Nitrofurantoin Nausea And Vomiting   Crab (Diagnostic) Hives   Latex Hives, Itching, Rash and Swelling    No family history on file.  Prior to Admission medications   Medication Sig Start Date End Date Taking? Authorizing Provider  albuterol (PROVENTIL) (2.5 MG/3ML) 0.083% nebulizer solution Take 2.5 mg by nebulization every 6 (six) hours as needed for wheezing or shortness of breath. 05/08/21  Yes [provider]  amitriptyline (ELAVIL) 10 MG tablet Take 1 tablet (10 mg total) by mouth 2 (two) times daily. 12/07/21 03/07/22 Yes [name], MD  ARIPiprazole (ABILIFY) 5 MG tablet Take 5 mg by mouth daily. 12/14/19  Yes [provider]  Ascorbic Acid (VITAMIN C) 1000 MG tablet Take 1,000 mg by mouth daily.   Yes [provider]  aspirin EC 81 MG tablet Take 81 mg by mouth daily. Swallow whole.   Yes [provider]  atorvastatin (LIPITOR) 40 MG tablet Take 40 mg by mouth at bedtime. 08/03/21  Yes [provider]  busPIRone (BUSPAR) 15 MG tablet Take 1 tablet (15 mg total) by mouth 2 (two) times daily. 12/07/21 03/07/22 Yes [name], MD  calcitRIOL (ROCALTROL) 0.25 MCG capsule Take 0.25 mcg by mouth every Monday, Wednesday, and Friday. 09/12/21  Yes [provider]  Cholecalciferol 25 MCG (1000 UT) tablet Take 1,000 Units by mouth daily. 05/01/21  Yes [provider]  cyanocobalamin (,VITAMIN B-12,) 1000 MCG/ML injection Inject 1,000 mcg into the muscle every 30 (thirty) days. 09/17/21  Yes [provider]  doxazosin (CARDURA) 4 MG tablet Take 4 mg by mouth daily. 08/20/21  Yes [provider]  DULoxetine (CYMBALTA) 60 MG capsule Take 1 capsule (60 mg total) by mouth daily. 12/07/21 03/07/22 Yes [name], MD   furosemide (LASIX) 80 MG tablet Take 80 mg by mouth daily. 08/09/21 08/22/22 Yes [provider]  gabapentin (NEURONTIN) 100 MG capsule Take 2 capsules (200 mg total) by mouth 2 (two) times daily. 12/07/21 03/07/22 Yes [name], MD  glimepiride (AMARYL) 2 MG tablet Take 1 tablet (2 mg total) by mouth daily before breakfast. 12/07/21 03/07/22 Yes [name], MD  hydrALAZINE (APRESOLINE) 50 MG tablet Take 50 mg by mouth 2 (two) times daily. 08/08/21  Yes [provider]  isosorbide mononitrate (IMDUR) 30 MG 24 hr tablet Take 30 mg by mouth every morning. 05/04/21  Yes [provider]  metoprolol succinate (TOPROL-XL) 50 MG 24 hr tablet Take 1 tablet (50 mg total) by mouth daily. 12/07/21 03/07/22 Yes [name], MD  Multiple Vitamins-Minerals (HAIR/SKIN/NAILS/BIOTIN PO) Take 2 each by mouth daily.   Yes [provider]  Multiple Vitamins-Minerals (MULTIVITAMIN WITH MINERALS) tablet Take 1 tablet by mouth daily.   Yes [provider]  mupirocin ointment (BACTROBAN) 2 % Apply 1 Application topically daily. 10/31/21  Yes Suzan Slick,  NP  omeprazole (PRILOSEC) 20 MG capsule Take 20 mg by mouth daily.   Yes [provider]  Probiotic Product (PROBIOTIC PO) Take 1 capsule by mouth daily.   Yes [provider]  telmisartan (MICARDIS) 20 MG tablet Take 20 mg by mouth daily. 10/10/21  Yes [provider]  zolpidem (AMBIEN) 5 MG tablet Take 1 tablet (5 mg total) by mouth at bedtime. 12/07/21 01/06/22 Yes [name], MD  ferrous sulfate 325 (65 FE) MG EC tablet Take 325 mg by mouth 2 (two) times daily. Patient not taking: Reported on 12/16/2021 02/19/21   [provider]    Physical Exam: Vitals:   12/16/21 1430 12/16/21 1500 12/16/21 1600 12/16/21 1626  BP: 94/73 (!) 103/56 (!) 114/50   Pulse: 64 61 62   Resp: 17 11 15    Temp:    98 F (36.7 C)  TempSrc:    Oral  SpO2: 98% 99% 97%   Weight:      Height:        General exam: Alert, awake, oriented x 3; currently reporting no chest pain, still feeling nauseated but no further bleeding.  Complaining of bilateral lower extremity cramps and numbness from her neuropathy problem. Respiratory system: No using accessory muscles, no wheezing, no crackles.  Good saturation on room air. Cardiovascular system: Rate controlled, no rubs, no gallops, no JVD appreciated on exam. Gastrointestinal system: Abdomen is nondistended, soft and nontender. No organomegaly or masses felt. Normal bowel sounds heard. Central nervous system: Alert and oriented. No focal neurological deficits. Extremities: No cyanosis or clubbing; 1-2+ edema appreciated bilaterally (according to patient no significant changes from her baseline). Skin: No petechiae. Psychiatry: Judgement and insight appear normal. Mood & affect appropriate.   Data Reviewed: Magnesium 1.8 Phosphorus 8.7 CK level: 166 COVID and influenza PCR negative. High sensitive troponin: 164>>154 Lactic acid 0.5>> 0.8 D-dimer 1.66 Urinalysis pending for collection CBG: 20 (per EMS report  on scene)>>79>>>59 Comprehensive metabolic panel: Sodium 254, potassium 5.3, chloride 111, bicarb 11, glucose 51, BUN 78, creatinine 5.90, calcium 7.8, GFR 8 and alkaline phosphatase 149. CBC with differential: WBCs 11.4, hemoglobin 8.4 platelet count 203K   Assessment and Plan: * Acute renal failure superimposed on stage 4 chronic kidney disease, unspecified acute renal failure type ([place]) - Patient with stage IV renal failure at baseline -Significant worsening in her condition with a creatinine reaching 5.90 and associated severe metabolic acidosis -Will avoid nephrotoxic agents, hypotension and contrast; patient's medications will be adjusted to current renal function -Fluid resuscitation with sodium bicarbonate has been started -Checking urinalysis and renal ultrasound. -Nephrology service has been consulted by emergency  department physician with recommendations to transfer patient to [facility] as she is a high risk for ended requiring dialysis and no availability for access placement at [facility] at least until Tuesday (12/18/2021).  HLD (hyperlipidemia) -continue statin  Depression with anxiety -no SI or hallucinations -resume home antidepressant and anxiolytics meds.   GERD (gastroesophageal reflux disease) - Continue PPI. -dose adjusted to BID for better assistance relieving symptoms.   Nausea & vomiting - Most likely associated with hypoglycemic event -Will provide as needed antiemetics -Continue PPI -Follow clinical response maintain adequate hydration.  Hyperkalemia - Treated with sodium bicarbonate, IV fluids, Lasix x1 dose and Lokelma -Most likely in the setting of worsening renal function -Follow electrolytes trend -Continue telemetry monitoring.  Elevated troponin - In the setting of demand ischemia versus worsening renal function -Patient with a heart score of 4-5  and significant risk factors. -Will continue to cycle troponin -Telemetry monitoring requested -Holding beta-blockers or any other agents in the setting of soft blood pressure and acute renal failure. -will resume aspirin and statin  Metabolic acidosis - In the setting of worsening renal function -Patient has been started on sodium bicarbonate -Follow bicarb levels and renal function trend and stability.  CIDP (chronic inflammatory demyelinating polyneuropathy) (HCC) -continue cymbalta and adjusted dose of neuronitn.  Chronic combined systolic and diastolic CHF (congestive heart failure) (HCC) - Currently holding antihypertensives and diuretics agents in the setting of soft blood pressure and worsening renal function -Follow daily weights and strict intake and output -Most recent echo with ejection fraction 40-45% (March 2023). -Cardiology was consulted and expressed that they can follow patient when she arrived to  come in consultation.  CAD (coronary artery disease) - Currently denied chest pain, but expressing shortness of breath -Patient with significant risk factors and elevated troponin on presentation -Case was briefly discussed with cardiologist on-call (Dr. Terrence Dupont) who felt patient troponin elevation most likely associated with worsening renal function. -Patient was initially started on heparin drip by EDP, second troponin has started to trend down -No acute ischemic changes on EKG. -Continue telemetry monitoring -Adequately continue to replete electrolytes -Follow daily weights, strict I's and O's and cardiology will see patient in consultation when she arrived to come. -Patient with recent echocardiogram in March 2023 demonstrating ejection fraction 40-45%; will hold on repeating echo at this moment and allow cardiology to determine if needed to further evaluate for any wall motion abnormalities. -continue ASA and statin  Uncontrolled type 2 diabetes mellitus with hyperglycemia, without long-term current use of insulin (HCC) - Presenting with severe hypoglycemia -At this moment holding oral hypoglycemic agents -Patient experiencing ongoing nausea/vomiting and having difficulty taking by mouth. -Follow CBGs every 4 hours -Receiving IV dextrose along with sodium bicarbonate. -Update A1c  Essential hypertension - Currently soft blood pressure -Patient with worsening renal function -Holding antihypertensive agents at this moment -Maintaining adequate hydration -Follow vital signs.  Hyperphosphatemia -In the setting of worsening renal function -Continue to follow electrolytes trend -Follow nephrology recommendations.    Advance Care Planning:   Code Status: Full Code   Consults: Nephrology (Dr. Augustin Coupe) and Cardiology (Dr. Terrence Dupont) by EDP.  Family Communication: no family at bedside on my examination.  Severity of Illness: The appropriate patient status for this patient is INPATIENT.  Inpatient status is judged to be reasonable and necessary in order to provide the required intensity of service to ensure the patient's safety. The patient's presenting symptoms, physical exam findings, and initial radiographic and laboratory data in the context of their chronic comorbidities is felt to place them at high risk for further clinical deterioration. Furthermore, it is not anticipated that the patient will be medically stable for discharge from the hospital within 2 midnights of admission.   * I certify that at the point of admission it is my clinical judgment that the patient will require inpatient hospital care spanning beyond 2 midnights from the point of admission due to high intensity of service, high risk for further deterioration and high frequency of surveillance required.*  Author: Barton Dubois, MD 12/16/2021 6:17 PM  For on call review [email or web address].

## 2021-12-16 NOTE — ED Notes (Signed)
Pt reports continued headache and nausea, MD aware

## 2021-12-16 NOTE — Assessment & Plan Note (Addendum)
-   In the setting of demand ischemia versus worsening renal function -Patient with a heart score of 4-5 and significant risk factors. -Will continue to cycle troponin -Telemetry monitoring requested -Holding beta-blockers or any other agents in the setting of soft blood pressure and acute renal failure. -will resume aspirin and statin

## 2021-12-16 NOTE — Assessment & Plan Note (Signed)
-  continue cymbalta and adjusted dose of neuronitn.

## 2021-12-16 NOTE — Assessment & Plan Note (Signed)
-   Currently soft blood pressure -Patient with worsening renal function -Holding antihypertensive agents at this moment -Maintaining adequate hydration -Follow vital signs.

## 2021-12-16 NOTE — Assessment & Plan Note (Addendum)
-   Continue PPI. -dose adjusted to BID for better assistance relieving symptoms.

## 2021-12-16 NOTE — ED Provider Notes (Signed)
[facility] EMERGENCY DEPARTMENT Provider Note   CSN: [id number] Arrival date & time: 12/16/21  1015     History  Chief Complaint  Patient presents with   Hypoglycemia    Tracy Walsh is a 56 y.o. female.  HPI Patient presents for hypoglycemia.  Medical history includes CKD, HTN, T2DM, CAD, CHF, chronic hypoxia, depression, CIDP, migraine headaches, neuropathy, and anemia.  She takes glimepiride for her diabetes.  She states that her last dose was yesterday morning.  Patient reports that she was in her normal state of health yesterday.  She ate some cereal at 10 PM.  This was the last time she had any food.  She woke up this morning feeling nausea, fatigue, generalized weakness.  Her husband called EMS.  EMS noted a CBG of 20.  Blood sugar normalized on arrival.  Patient continues to endorse nausea and fatigue.  She denies any recent infectious symptoms.  She has had increased leg swelling which she attributes to her CHF.  This is managed with p.o. Lasix.  She does have a chronic cough.    Home Medications Prior to Admission medications   Medication Sig Start Date End Date Taking? Authorizing Provider  albuterol (PROVENTIL) (2.5 MG/3ML) 0.083% nebulizer solution Take 2.5 mg by nebulization every 6 (six) hours as needed for wheezing or shortness of breath. 05/08/21  Yes [provider]  amitriptyline (ELAVIL) 10 MG tablet Take 1 tablet (10 mg total) by mouth 2 (two) times daily. 12/07/21 03/07/22 Yes [name], MD  ARIPiprazole (ABILIFY) 5 MG tablet Take 5 mg by mouth daily. 12/14/19  Yes [provider]  Ascorbic Acid (VITAMIN C) 1000 MG tablet Take 1,000 mg by mouth daily.   Yes [provider]  aspirin EC 81 MG tablet Take 81 mg by mouth daily. Swallow whole.   Yes [provider]  atorvastatin (LIPITOR) 40 MG tablet Take 40 mg by mouth at bedtime. 08/03/21  Yes [provider]  busPIRone (BUSPAR) 15 MG tablet Take 1 tablet (15 mg total)  by mouth 2 (two) times daily. 12/07/21 03/07/22 Yes [name], MD  calcitRIOL (ROCALTROL) 0.25 MCG capsule Take 0.25 mcg by mouth every Monday, Wednesday, and Friday. 09/12/21  Yes [provider]  Cholecalciferol 25 MCG (1000 UT) tablet Take 1,000 Units by mouth daily. 05/01/21  Yes [provider]  cyanocobalamin (,VITAMIN B-12,) 1000 MCG/ML injection Inject 1,000 mcg into the muscle every 30 (thirty) days. 09/17/21  Yes [provider]  doxazosin (CARDURA) 4 MG tablet Take 4 mg by mouth daily. 08/20/21  Yes [provider]  DULoxetine (CYMBALTA) 60 MG capsule Take 1 capsule (60 mg total) by mouth daily. 12/07/21 03/07/22 Yes [name], MD  furosemide (LASIX) 80 MG tablet Take 80 mg by mouth daily. 08/09/21 08/22/22 Yes [provider]  gabapentin (NEURONTIN) 100 MG capsule Take 2 capsules (200 mg total) by mouth 2 (two) times daily. 12/07/21 03/07/22 Yes [name], MD  glimepiride (AMARYL) 2 MG tablet Take 1 tablet (2 mg total) by mouth daily before breakfast. 12/07/21 03/07/22 Yes [name], MD  hydrALAZINE (APRESOLINE) 50 MG tablet Take 50 mg by mouth 2 (two) times daily. 08/08/21  Yes [provider]  isosorbide mononitrate (IMDUR) 30 MG 24 hr tablet Take 30 mg by mouth every morning. 05/04/21  Yes [provider]  metoprolol succinate (TOPROL-XL) 50 MG 24 hr tablet Take 1 tablet (50 mg total) by mouth daily. 12/07/21 03/07/22 Yes Antwon Rochin,  Hazle Nordmann, MD  Multiple Vitamins-Minerals (HAIR/SKIN/NAILS/BIOTIN PO) Take 2 each by mouth daily.   Yes [provider]  Multiple Vitamins-Minerals (MULTIVITAMIN WITH MINERALS) tablet Take 1 tablet by mouth daily.   Yes [provider]  mupirocin ointment (BACTROBAN) 2 % Apply 1 Application topically daily. 10/31/21  Yes Suzan Slick, NP  omeprazole (PRILOSEC) 20 MG capsule Take 20 mg by mouth daily.   Yes [provider]  Probiotic Product (PROBIOTIC PO) Take  1 capsule by mouth daily.   Yes [provider]  telmisartan (MICARDIS) 20 MG tablet Take 20 mg by mouth daily. 10/10/21  Yes [provider]  zolpidem (AMBIEN) 5 MG tablet Take 1 tablet (5 mg total) by mouth at bedtime. 12/07/21 01/06/22 Yes [name], MD  ferrous sulfate 325 (65 FE) MG EC tablet Take 325 mg by mouth 2 (two) times daily. Patient not taking: Reported on 12/16/2021 02/19/21   [provider]      Allergies    Amoxicillin, Clarithromycin, Codeine, Hydrocodone, Moxifloxacin, Penicillins, Sulfa antibiotics, Nitrofurantoin, Crab (diagnostic), and Latex    Review of Systems   Review of Systems  Constitutional:  Positive for fatigue.  Respiratory:  Positive for cough (Chronic).   Cardiovascular:  Positive for leg swelling (Chronic).  Gastrointestinal:  Positive for nausea.  Neurological:  Positive for weakness (Generalized).  All other systems reviewed and are negative.   Physical Exam Updated Vital Signs BP (!) 101/50   Pulse (!) 59   Temp 97.6 F (36.4 C) (Oral)   Resp 12   Ht 5\' 8"  (1.727 m)   Wt 90.7 kg   SpO2 100%   BMI 30.41 kg/m  Physical Exam Vitals and nursing note reviewed.  Constitutional:      General: She is not in acute distress.    Appearance: Normal appearance. She is well-developed. She is not toxic-appearing or diaphoretic.  HENT:     Head: Normocephalic and atraumatic.     Right Ear: External ear normal.     Left Ear: External ear normal.     Nose: Nose normal.     Mouth/Throat:     Mouth: Mucous membranes are moist.     Pharynx: Oropharynx is clear.  Eyes:     Extraocular Movements: Extraocular movements intact.     Conjunctiva/sclera: Conjunctivae normal.  Cardiovascular:     Rate and Rhythm: Normal rate and regular rhythm.     Heart sounds: No murmur heard. Pulmonary:     Effort: Pulmonary effort is normal. No respiratory distress.     Breath sounds: Normal breath sounds. No wheezing or rales.   Abdominal:     General: There is no distension.     Palpations: Abdomen is soft.     Tenderness: There is no abdominal tenderness.  Musculoskeletal:        General: No swelling or deformity. Normal range of motion.     Cervical back: Normal range of motion and neck supple.     Right lower leg: Edema present.     Left lower leg: Edema present.  Skin:    General: Skin is warm and dry.     Capillary Refill: Capillary refill takes less than 2 seconds.     Coloration: Skin is not jaundiced or pale.  Neurological:     General: No focal deficit present.     Mental Status: She is alert.     Cranial Nerves: No cranial nerve deficit.     Sensory: No sensory  deficit.     Motor: No weakness.     Coordination: Coordination normal.  Psychiatric:        Mood and Affect: Mood normal.        Behavior: Behavior normal.        Thought Content: Thought content normal.        Judgment: Judgment normal.     ED Results / Procedures / Treatments   Labs (all labs ordered are listed, but only abnormal results are displayed) Labs Reviewed  COMPREHENSIVE METABOLIC PANEL - Abnormal; Notable for the following components:      Result Value   Sodium 131 (*)    Potassium 5.3 (*)    CO2 11 (*)    Glucose, Bld 51 (*)    BUN 78 (*)    Creatinine, Ser 5.90 (*)    Calcium 7.8 (*)    Albumin 3.0 (*)    Alkaline Phosphatase 149 (*)    GFR, Estimated 8 (*)    All other components within normal limits  CBC WITH DIFFERENTIAL/PLATELET - Abnormal; Notable for the following components:   WBC 11.4 (*)    RBC 2.75 (*)    Hemoglobin 8.4 (*)    HCT 27.1 (*)    RDW 16.1 (*)    Neutro Abs 9.9 (*)    Lymphs Abs 0.3 (*)    Abs Immature Granulocytes 0.17 (*)    All other components within normal limits  D-DIMER, QUANTITATIVE - Abnormal; Notable for the following components:   D-Dimer, Quant 1.66 (*)    All other components within normal limits  CBG MONITORING, ED - Abnormal; Notable for the following  components:   Glucose-Capillary 59 (*)    All other components within normal limits  TROPONIN I (HIGH SENSITIVITY) - Abnormal; Notable for the following components:   Troponin I (High Sensitivity) 164 (*)    All other components within normal limits  TROPONIN I (HIGH SENSITIVITY) - Abnormal; Notable for the following components:   Troponin I (High Sensitivity) 154 (*)    All other components within normal limits  RESP PANEL BY RT-PCR (FLU A&B, COVID) [id number]  LACTIC ACID, PLASMA  LACTIC ACID, PLASMA  URINALYSIS, ROUTINE W REFLEX MICROSCOPIC  CK  PHOSPHORUS  MAGNESIUM  HEMOGLOBIN A1C  CBG MONITORING, ED    EKG EKG Interpretation  Date/Time:  Sunday December 16 2021 10:23:32 EDT Ventricular Rate:  60 PR Interval:  159 QRS Duration: 130 QT Interval:  458 QTC Calculation: 458 R Axis:   -43 Text Interpretation: Sinus rhythm Nonspecific T wave abnormality inferior leads Confirmed by Godfrey Pick [phone]) on 12/16/2021 12:29:03 PM  Radiology DG Chest Port 1 View  Result Date: 12/16/2021 CLINICAL DATA:  Questionable sepsis. EXAM: PORTABLE CHEST 1 VIEW COMPARISON:  08/29/2021. FINDINGS: Cardiac silhouette is normal in size. Normal mediastinal and hilar contours. Stable left anterior chest wall Port-A-Cath. Clear lungs.  No pleural effusion or pneumothorax. Skeletal structures are grossly intact. IMPRESSION: No active disease. Electronically Signed   By: Lajean Manes M.D.   On: 12/16/2021 11:08    Procedures Procedures    Medications Ordered in ED Medications  sodium bicarbonate 150 mEq in dextrose 5 % 1,150 mL infusion ( Intravenous New Bag/Given 12/16/21 1517)  dextrose 50 % solution 25 mL (has no administration in time range)  ondansetron (ZOFRAN) injection 4 mg (has no administration in time range)  pantoprazole (PROTONIX) injection 40 mg (has no administration in time range)  acetaminophen (TYLENOL) tablet 650 mg (has no administration  in time range)    Or  acetaminophen (TYLENOL)  suppository 650 mg (has no administration in time range)  ondansetron (ZOFRAN) injection 4 mg (4 mg Intravenous Given 12/16/21 1141)  acetaminophen (TYLENOL) tablet 650 mg (650 mg Oral Given 12/16/21 1228)  aspirin chewable tablet 324 mg (324 mg Oral Given 12/16/21 1237)  sodium chloride 0.9 % bolus 500 mL (0 mLs Intravenous Stopped 12/16/21 1400)  heparin bolus via infusion 4,000 Units (4,000 Units Intravenous Bolus from Bag 12/16/21 1411)  furosemide (LASIX) injection 20 mg (20 mg Intravenous Given 12/16/21 1400)  metoCLOPramide (REGLAN) injection 10 mg (10 mg Intravenous Given 12/16/21 1453)  diphenhydrAMINE (BENADRYL) injection 12.5 mg (12.5 mg Intravenous Given 12/16/21 1450)  sodium zirconium cyclosilicate (LOKELMA) packet 10 g (10 g Oral Given 12/16/21 1459)    ED Course/ Medical Decision Making/ A&P                           Medical Decision Making Amount and/or Complexity of Data Reviewed Labs: ordered. Radiology: ordered. ECG/medicine tests: ordered.  Risk OTC drugs. Prescription drug management. Decision regarding hospitalization.   This patient presents to the ED for concern of hypoglycemia, this involves an extensive number of treatment options, and is a complaint that carries with it a high risk of complications and morbidity.  The differential diagnosis includes polypharmacy, decreased p.o. intake, infection, ACS   Co morbidities that complicate the patient evaluation  CKD, HTN, T2DM, CAD, CHF, chronic hypoxia, depression, CIDP, migraine headaches, neuropathy, and anemia   Additional history obtained:  Additional history obtained from patient's significant other External records from outside source obtained and reviewed including EMR   Lab Tests:  I Ordered, and personally interpreted labs.  The pertinent results include: Elevated troponin, stable on repeat; normal lactate; acute renal failure with hyperkalemia and metabolic acidosis; baseline anemia, mild leukocytosis, mild  elevation in D-dimer   Imaging Studies ordered:  I ordered imaging studies including chest x-ray I independently visualized and interpreted imaging which showed no acute findings I agree with the radiologist interpretation   Cardiac Monitoring: / EKG:  The patient was maintained on a cardiac monitor.  I personally viewed and interpreted the cardiac monitored which showed an underlying rhythm of: Sinus rhythm   Consultations Obtained:  I requested consultation with the cardiologist, Dr. Terrence Dupont,  and discussed lab and imaging findings as well as pertinent plan - they recommend: No further interventions if troponin is stable I requested consultation with the nephrologist, Dr. Cheral Marker,  and discussed lab and imaging findings as well as pertinent plan - they recommend: Admission to [facility]   Problem List / ED Course / Critical interventions / Medication management  Patient is a 56 year old female presenting for hyperglycemia.  This was initially identified by EMS, who was called to her home due to the patient feeling nauseous, fatigue, and generalized weakness.  She is prescribed glimepiride and her last dose was yesterday morning.  She did eat a normal amount of food yesterday.  She has not eaten yet today.  Patient's blood glucose normalized prior to arrival.  On arrival, she continues to endorse fatigue, weakness, nausea, and headache.  Patient was given Tylenol and Zofran for symptomatic relief.  Diagnostic work-up was initiated.  Although patient had no ST segment abnormalities on EKG, her initial troponin was elevated at 164.  She was given ASA and initiated on heparin.  Other notable lab work includes acute on chronic renal failure.  Patient's baseline creatinine is in the range of 2.  Today it is 5.90.  She also has a non-anion gap metabolic acidosis and hyperkalemia.  Patient was given treatment for her hyperkalemia.  This consisted of Lokelma, IV fluids, and Lasix.  She endorsed  continued headache and Benadryl and Reglan were ordered.  Bicarb drip was initiated for treatment of both acidosis and hypoglycemia, which was recurrent while in the ED.  I spoke with cardiologist on-call, Dr. Terrence Dupont, who feels that elevation in troponin is likely secondary to renal failure.  On repeat troponin, level did downtrend.  Heparin GTT was discontinued.  Patient does have a moderate elevation in her D-dimer.  This does raise concern for possible PE.  Currently, given her renal failure, will avoid contrasted studies.  Decision for VQ scan is deferred to admitting team.  I spoke with nephrologist on-call, Dr. Augustin Coupe, who feels that patient may end up needing dialysis.  Because placement of a Vas-Cath would not be available at [facility] until at least Tuesday, he does recommend admission to [facility].  This was discussed with hospitalist, who will admit the patient for further management. I ordered medication including Zofran for nausea; Tylenol, Reglan, Benadryl for headache; IV fluid, Lokelma, Lasix for hyperkalemia; ASA and heparin for NSTEMI; bicarb and dextrose gtt. for metabolic acidosis and recurrent hypoglycemia Reevaluation of the patient after these medicines showed that the patient improved I have reviewed the patients home medicines and have made adjustments as needed   Social Determinants of Health:  Recently moved to local area and has not established outpatient specialty providers   CRITICAL CARE Performed by: Godfrey Pick   Total critical care time: 35 minutes  Critical care time was exclusive of separately billable procedures and treating other patients.  Critical care was necessary to treat or prevent imminent or life-threatening deterioration.  Critical care was time spent personally by me on the following activities: development of treatment plan with patient and/or surrogate as well as nursing, discussions with consultants, evaluation of patient's response to treatment,  examination of patient, obtaining history from patient or surrogate, ordering and performing treatments and interventions, ordering and review of laboratory studies, ordering and review of radiographic studies, pulse oximetry and re-evaluation of patient's condition.          Final Clinical Impression(s) / ED Diagnoses Final diagnoses:  Acute renal failure, unspecified acute renal failure type (HCC)  Hyperkalemia  Hypoglycemia  Metabolic acidosis    Rx / DC Orders ED Discharge Orders     None         Godfrey Pick, MD 12/16/21 1523

## 2021-12-16 NOTE — Assessment & Plan Note (Signed)
continue statin

## 2021-12-16 NOTE — Assessment & Plan Note (Signed)
-   Patient with stage IV renal failure at baseline -Significant worsening in her condition with a creatinine reaching 5.90 and associated severe metabolic acidosis -Will avoid nephrotoxic agents, hypotension and contrast; patient's medications will be adjusted to current renal function -Fluid resuscitation with sodium bicarbonate has been started -Checking urinalysis and renal ultrasound. -Nephrology service has been consulted by emergency department physician with recommendations to transfer patient to Tracy Walsh as she is a high risk for ended requiring dialysis and no availability for access placement at [facility] at least until Tuesday (12/18/2021).

## 2021-12-17 ENCOUNTER — Other Ambulatory Visit: Payer: Self-pay

## 2021-12-17 DIAGNOSIS — N184 Chronic kidney disease, stage 4 (severe): Secondary | ICD-10-CM | POA: Diagnosis not present

## 2021-12-17 DIAGNOSIS — N179 Acute kidney failure, unspecified: Secondary | ICD-10-CM | POA: Diagnosis not present

## 2021-12-17 LAB — GLUCOSE, CAPILLARY
Glucose-Capillary: 120 mg/dL — ABNORMAL HIGH (ref 70–99)
Glucose-Capillary: 123 mg/dL — ABNORMAL HIGH (ref 70–99)
Glucose-Capillary: 46 mg/dL — ABNORMAL LOW (ref 70–99)
Glucose-Capillary: 50 mg/dL — ABNORMAL LOW (ref 70–99)
Glucose-Capillary: 55 mg/dL — ABNORMAL LOW (ref 70–99)
Glucose-Capillary: 59 mg/dL — ABNORMAL LOW (ref 70–99)
Glucose-Capillary: 67 mg/dL — ABNORMAL LOW (ref 70–99)
Glucose-Capillary: 71 mg/dL (ref 70–99)
Glucose-Capillary: 79 mg/dL (ref 70–99)
Glucose-Capillary: 81 mg/dL (ref 70–99)
Glucose-Capillary: 84 mg/dL (ref 70–99)
Glucose-Capillary: 85 mg/dL (ref 70–99)
Glucose-Capillary: 86 mg/dL (ref 70–99)
Glucose-Capillary: 86 mg/dL (ref 70–99)
Glucose-Capillary: 88 mg/dL (ref 70–99)

## 2021-12-17 LAB — COMPREHENSIVE METABOLIC PANEL
ALT: 32 U/L (ref 0–44)
AST: 33 U/L (ref 15–41)
Albumin: 2.7 g/dL — ABNORMAL LOW (ref 3.5–5.0)
Alkaline Phosphatase: 129 U/L — ABNORMAL HIGH (ref 38–126)
Anion gap: 9 (ref 5–15)
BUN: 78 mg/dL — ABNORMAL HIGH (ref 6–20)
CO2: 13 mmol/L — ABNORMAL LOW (ref 22–32)
Calcium: 7.4 mg/dL — ABNORMAL LOW (ref 8.9–10.3)
Chloride: 111 mmol/L (ref 98–111)
Creatinine, Ser: 6.16 mg/dL — ABNORMAL HIGH (ref 0.44–1.00)
GFR, Estimated: 8 mL/min — ABNORMAL LOW (ref >60)
Glucose, Bld: 46 mg/dL — ABNORMAL LOW (ref 70–99)
Potassium: 4.5 mmol/L (ref 3.5–5.1)
Sodium: 133 mmol/L — ABNORMAL LOW (ref 135–145)
Total Bilirubin: 0.4 mg/dL (ref 0.3–1.2)
Total Protein: 5.8 g/dL — ABNORMAL LOW (ref 6.5–8.1)

## 2021-12-17 LAB — CBC
HCT: 24 % — ABNORMAL LOW (ref 36.0–46.0)
Hemoglobin: 7.7 g/dL — ABNORMAL LOW (ref 12.0–15.0)
MCH: 30.9 pg (ref 26.0–34.0)
MCHC: 32.1 g/dL (ref 30.0–36.0)
MCV: 96.4 fL (ref 80.0–100.0)
Platelets: 204 10*3/uL (ref 150–400)
RBC: 2.49 MIL/uL — ABNORMAL LOW (ref 3.87–5.11)
RDW: 15.9 % — ABNORMAL HIGH (ref 11.5–15.5)
WBC: 8.5 10*3/uL (ref 4.0–10.5)
nRBC: 0 % (ref 0.0–0.2)

## 2021-12-17 MED ORDER — DEXTROSE 50 % IV SOLN
12.5000 g | INTRAVENOUS | Status: DC | PRN
  Administered 2021-12-17: 12.5 g via INTRAVENOUS
  Filled 2021-12-17: qty 50

## 2021-12-17 MED ORDER — SODIUM CHLORIDE 0.9 % IV SOLN
250.0000 mg | Freq: Every day | INTRAVENOUS | Status: AC
  Administered 2021-12-17 – 2021-12-18 (×2): 250 mg via INTRAVENOUS
  Filled 2021-12-17 (×2): qty 20

## 2021-12-17 MED ORDER — SODIUM CHLORIDE 0.9 % IV BOLUS
250.0000 mL | Freq: Once | INTRAVENOUS | Status: AC
  Administered 2021-12-17: 250 mL via INTRAVENOUS

## 2021-12-17 NOTE — Progress Notes (Signed)
Pt CBG dropped to 46 mg/dl,looked lethargic but alert and oriented,pushed 50% dextrose,post 15 min CBG was 123mg /dl. Pt BP was at low range 81/46 ,MAP 56.DR Rathore notified.Ordered for bolus NS 250 ml being running.

## 2021-12-17 NOTE — Plan of Care (Signed)
°  Problem: Nutrition: °Goal: Adequate nutrition will be maintained °Outcome: Progressing °  °Problem: Coping: °Goal: Level of anxiety will decrease °Outcome: Progressing °  °Problem: Safety: °Goal: Ability to remain free from injury will improve °Outcome: Progressing °  °

## 2021-12-17 NOTE — TOC Initial Note (Signed)
Transition of Care [facility]) - Initial/Assessment Note    Patient Details  Name: Tracy Walsh MRN: [id number] Date of Birth: 11/09/1965  [facility] of [facility]) CM/SW Contact:    Cyndi Bender, RN Phone Number: 12/17/2021, 2:17 PM  Clinical Narrative:                 Spoke to patient and husband at bedside.   Patient states she has walker, wheelchair and cane at home. Patient has used home health in the past she was living in [place] at the time. Patient is interested in home health if therapy recommends.   Patient has transportation to appointments and can afford her prescriptions.   Address, Phone number and PCP verified.   TOC will continue to follow for needs.  Expected Discharge Plan: [place] Barriers to Discharge: Continued Medical Work up   Patient Goals and CMS Choice Patient states their goals for this hospitalization and ongoing recovery are:: return home      Expected Discharge Plan and Services Expected Discharge Plan: [place]   Discharge Planning Services: CM Consult   Living arrangements for the past 2 months: Single Family Home                                      Prior Living Arrangements/Services Living arrangements for the past 2 months: Single Family Home Lives with:: Spouse Patient language and need for interpreter reviewed:: Yes Do you feel safe going back to the place where you live?: Yes      Need for Family Participation in Patient Care: Yes (Comment) Care giver support system in place?: Yes (comment) Current home services: DME (walker, cane, wheelchair) Criminal Activity/Legal Involvement Pertinent to Current Situation/Hospitalization: No - Comment as needed  Activities of Daily Living Home Assistive Devices/Equipment: Cane (specify quad or straight), Walker (specify type), Wheelchair ADL Screening (condition at time of admission) Patient's cognitive ability adequate to safely complete  daily activities?: Yes Is the patient deaf or have difficulty hearing?: No Does the patient have difficulty seeing, even when wearing glasses/contacts?: No Does the patient have difficulty concentrating, remembering, or making decisions?: No Patient able to express need for assistance with ADLs?: Yes Does the patient have difficulty dressing or bathing?: No Independently performs ADLs?: Yes (appropriate for developmental age) Does the patient have difficulty walking or climbing stairs?: No Weakness of Legs: Both Weakness of Arms/Hands: Both  Permission Sought/Granted                  Emotional Assessment   Attitude/Demeanor/Rapport: Engaged Affect (typically observed): Accepting Orientation: : Oriented to Self, Oriented to Place, Oriented to  Time, Oriented to Situation Alcohol / Substance Use: Not Applicable Psych Involvement: No (comment)  Admission diagnosis:  Hyperkalemia [Y80.1] Metabolic acidosis [K55.37] Hypoglycemia [E16.2] Acute renal failure, unspecified acute renal failure type (HCC) [N17.9] Acute renal failure superimposed on stage 4 chronic kidney disease, unspecified acute renal failure type ([place]) [N17.9, N18.4] Patient Active Problem List   Diagnosis Date Noted   Acute renal failure superimposed on stage 4 chronic kidney disease, unspecified acute renal failure type ([place]) 48/27/0786   Metabolic acidosis 75/44/9201   Elevated troponin 12/16/2021   Hyperkalemia 12/16/2021   Nausea & vomiting 12/16/2021   GERD (gastroesophageal reflux disease) 12/16/2021   Depression with anxiety 12/16/2021   HLD (hyperlipidemia) 12/16/2021   Chronic kidney disease, stage 4 (severe) (  [place]) 12/07/2021   Depression, recurrent ([place]) 12/07/2021   CIDP (chronic inflammatory demyelinating polyneuropathy) ([place]) 12/07/2021   Tobacco use 12/07/2021   Preventative health care 12/07/2021   Lower extremity edema 11/26/2021   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   Iron  deficiency anemia 10/11/2021   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)    Chronic combined systolic and diastolic CHF (congestive heart failure) ([place]) 08/31/2021   Chronic respiratory failure with hypoxia ([place]) 08/31/2021   Acute renal failure superimposed on stage 4 chronic kidney disease ([place]) 08/29/2021   Chronic diarrhea 08/29/2021   Hypokalemia 08/29/2021   Essential hypertension 08/29/2021   Uncontrolled type 2 diabetes mellitus with hyperglycemia, without long-term current use of insulin ([place]) 08/29/2021   Diabetic foot ulcer ([place]) 08/29/2021   CAD (coronary artery disease) 08/29/2021   Diabetic foot ([place]) 01/04/2020   PCP:  [name], MD Pharmacy:   [id number] - [place], [place] [address] [place] [address] Phone: [phone] Fax: [phone]     Social Determinants of Health (SDOH) Interventions    Readmission Risk Interventions    08/30/2021    1:38 PM  Readmission Risk Prevention Plan  Transportation Screening Complete  Home Care Screening Complete  Medication Review (RN CM) Complete

## 2021-12-17 NOTE — Progress Notes (Signed)
PROGRESS NOTE    Tracy Walsh  [facility]:[id number] DOB: 22-Dec-1965 DOA: 12/16/2021 PCP: [name], MD  Outpatient Specialists:     Brief Narrative:  Patient is a 56 year old Caucasian female with baseline chronic kidney disease stage IV, diabetes mellitus, hyperlipidemia, combined systolic and diastolic congestive heart failure with documented EF of 40 to 45%, coronary artery disease, CIDP, and neuropathy and chronic diarrhea.  According to the patient and patient's husband, patient has had diarrhea for over a year, with history of recurrent C. difficile infection.  Recent worsening of diarrhea was reported by the patient.  Whilst asleep, patient was noted to be sweating significantly and talking.  Patient's blood sugar was checked by the husband and found to be 20.  Patient was brought to the hospital for further assessment and management.  On presentation, patient was found to be in acute kidney injury on chronic kidney disease stage IV, mildly hyperkalemia, elevated troponin and severe metabolic acidosis.  Significant peripheral edema (edema of the lower extremities) noted.  Patient is currently on bicarb drip/D5.  Apparently, patient was on insulin prior to presentation.  Hyperkalemia has responded to medical treatment.  Nephrology input is appreciated.  Cardiology team is also being consulted.  12/17/2021: Patient seen alongside patient's husband.  No new complaints today.   Assessment & Plan:   Principal Problem:   Acute renal failure superimposed on stage 4 chronic kidney disease, unspecified acute renal failure type (HCC) Active Problems:   Essential hypertension   Uncontrolled type 2 diabetes mellitus with hyperglycemia, without long-term current use of insulin (HCC)   CAD (coronary artery disease)   Chronic combined systolic and diastolic CHF (congestive heart failure) (HCC)   CIDP (chronic inflammatory demyelinating polyneuropathy) (HCC)   Metabolic acidosis   Elevated troponin    Hyperkalemia   Nausea & vomiting   GERD (gastroesophageal reflux disease)   Depression with anxiety   HLD (hyperlipidemia)   * Acute renal failure superimposed on stage 4 chronic kidney disease, unspecified acute renal failure type ([place]) - Patient with stage IV renal failure at baseline -Significant worsening in her condition with a creatinine reaching 5.90 and associated severe metabolic acidosis -Will avoid nephrotoxic agents, hypotension and contrast; patient's medications will be adjusted to current renal function -Fluid resuscitation with sodium bicarbonate has been started -Checking urinalysis and renal ultrasound. -Nephrology service has been consulted by emergency department physician with recommendations to transfer patient to [facility] as she is a high risk for ended requiring dialysis and no availability for access placement at [facility] at least until Tuesday (12/18/2021). 12/17/2021: Acute kidney injury is likely secondary to volume loss and relative hypotension.  Nephrology team has been consulted.  Cautious hydration.   HLD (hyperlipidemia) -continue statin   Depression with anxiety -no SI or hallucinations -resume home antidepressant and anxiolytics meds.    GERD (gastroesophageal reflux disease) - Continue PPI. -dose adjusted to BID for better assistance relieving symptoms.    Nausea & vomiting - Most likely associated with hypoglycemic event -Will provide as needed antiemetics -Continue PPI -Follow clinical response maintain adequate hydration. 12/17/2021: Resolved.   Hyperkalemia - Treated with sodium bicarbonate, IV fluids, Lasix x1 dose and Lokelma -Most likely in the setting of worsening renal function -Follow electrolytes trend -Continue telemetry monitoring. 12/17/2021: Mild, resolved with medical treatment.   Elevated troponin - In the setting of demand ischemia versus worsening renal function -Patient with a heart score of 4-5 and significant risk  factors. -Will continue to cycle troponin -Telemetry  monitoring requested -Holding beta-blockers or any other agents in the setting of soft blood pressure and acute renal failure. -will resume aspirin and statin 12/17/2021: Cardiology team has been consulted.  No chest pain reported.   Metabolic acidosis - In the setting of worsening renal function -Patient has been started on sodium bicarbonate -Follow bicarb levels and renal function trend and stability. 12/17/2021: Likely secondary to worsening diarrhea and acute kidney injury.   CIDP (chronic inflammatory demyelinating polyneuropathy) (HCC) -continue cymbalta and adjusted dose of neuronitn.   Chronic combined systolic and diastolic CHF (congestive heart failure) (HCC) - Currently holding antihypertensives and diuretics agents in the setting of soft blood pressure and worsening renal function -Follow daily weights and strict intake and output -Most recent echo with ejection fraction 40-45% (March 2023). -Cardiology was consulted and expressed that they can follow patient when she arrived to come in consultation.   CAD (coronary artery disease) - Currently denied chest pain, but expressing shortness of breath -Patient with significant risk factors and elevated troponin on presentation -Case was briefly discussed with cardiologist on-call (Dr. Terrence Dupont) who felt patient troponin elevation most likely associated with worsening renal function. -Patient was initially started on heparin drip by EDP, second troponin has started to trend down -No acute ischemic changes on EKG. -Continue telemetry monitoring -Adequately continue to replete electrolytes -Follow daily weights, strict I's and O's and cardiology will see patient in consultation when she arrived to come. -Patient with recent echocardiogram in March 2023 demonstrating ejection fraction 40-45%; will hold on repeating echo at this moment and allow cardiology to determine if needed to  further evaluate for any wall motion abnormalities. -continue ASA and statin   Uncontrolled type 2 diabetes mellitus with hyperglycemia, without long-term current use of insulin ([place]) - Presenting with severe hypoglycemia -At this moment holding oral hypoglycemic agents -Patient experiencing ongoing nausea/vomiting and having difficulty taking by mouth. -Follow CBGs every 4 hours -Receiving IV dextrose along with sodium bicarbonate. -Update A1c 12/17/2021: Admitted with hypoglycemia.  Continue to monitor blood sugar.  Optimize insulin therapy on discharge.   Essential hypertension - Currently soft blood pressure -Patient with worsening renal function -Holding antihypertensive agents at this moment -Maintaining adequate hydration -Follow vital signs. 12/17/2021: Patient is currently mildly hypotensive.  Monitor closely.   Hyperphosphatemia -In the setting of worsening renal function -Continue to follow electrolytes trend -Follow nephrology recommendations.       DVT prophylaxis: Subcutaneous heparin Code Status: Full code Family Communication: Husband Disposition Plan: Home eventually   Consultants:  Nephrology. Cardiology  Procedures:  None  Antimicrobials:  None   Subjective: No new complaints. Chronic diarrhea.  Objective: Vitals:   12/17/21 0200 12/17/21 0400 12/17/21 0405 12/17/21 0736  BP: (!) 90/45 (!) 92/56  (!) 94/55  Pulse:  (!) 56  (!) 58  Resp:  10 17 15   Temp:  (!) 97.4 F (36.3 C) (!) 97.3 F (36.3 C) 97.8 F (36.6 C)  TempSrc:    Oral  SpO2:  94%  97%  Weight:      Height:        Intake/Output Summary (Last 24 hours) at 12/17/2021 1044 Last data filed at 12/16/2021 1400 Gross per 24 hour  Intake 500 ml  Output --  Net 500 ml   Filed Weights   12/16/21 1142  Weight: 90.7 kg    Examination:  General exam: Appears calm and comfortable  Respiratory system: Clear to auscultation. Respiratory effort normal. Cardiovascular system: S1 &  S2 heard, RRR.  No JVD, murmurs, rubs, gallops or clicks. No pedal edema. Gastrointestinal system: Abdomen is nondistended, soft and nontender. No organomegaly or masses felt. Normal bowel sounds heard. Central nervous system: Alert and oriented. No focal neurological deficits. Extremities: Symmetric 5 x 5 power. Skin: No rashes, lesions or ulcers Psychiatry: Judgement and insight appear normal. Mood & affect appropriate.     Data Reviewed: I have personally reviewed following labs and imaging studies  CBC: Recent Labs  Lab 12/16/21 1131 12/17/21 0315  WBC 11.4* 8.5  NEUTROABS 9.9*  --   HGB 8.4* 7.7*  HCT 27.1* 24.0*  MCV 98.5 96.4  PLT 203 588   Basic Metabolic Panel: Recent Labs  Lab 12/16/21 1131 12/16/21 1513 12/17/21 0315  NA 131*  --  133*  K 5.3*  --  4.5  CL 111  --  111  CO2 11*  --  13*  GLUCOSE 51*  --  46*  BUN 78*  --  78*  CREATININE 5.90*  --  6.16*  CALCIUM 7.8*  --  7.4*  MG  --  1.8  --   PHOS  --  8.7*  --    GFR: Estimated Creatinine Clearance: 12.3 mL/min (A) (by C-G formula based on SCr of 6.16 mg/dL (H)). Liver Function Tests: Recent Labs  Lab 12/16/21 1131 12/17/21 0315  AST 27 33  ALT 34 32  ALKPHOS 149* 129*  BILITOT 0.6 0.4  PROT 6.7 5.8*  ALBUMIN 3.0* 2.7*   No results for input(s): "LIPASE", "AMYLASE" in the last 168 hours. No results for input(s): "AMMONIA" in the last 168 hours. Coagulation Profile: No results for input(s): "INR", "PROTIME" in the last 168 hours. Cardiac Enzymes: Recent Labs  Lab 12/16/21 1424  CKTOTAL 166   BNP (last 3 results) No results for input(s): "PROBNP" in the last 8760 hours. HbA1C: Recent Labs    12/16/21 1514  HGBA1C 6.3*   CBG: Recent Labs  Lab 12/17/21 0301 12/17/21 0407 12/17/21 0542 12/17/21 0735 12/17/21 1014  GLUCAP 50* 120* 81 88 84   Lipid Profile: No results for input(s): "CHOL", "HDL", "LDLCALC", "TRIG", "CHOLHDL", "LDLDIRECT" in the last 72 hours. Thyroid Function  Tests: No results for input(s): "TSH", "T4TOTAL", "FREET4", "T3FREE", "THYROIDAB" in the last 72 hours. Anemia Panel: No results for input(s): "VITAMINB12", "FOLATE", "FERRITIN", "TIBC", "IRON", "RETICCTPCT" in the last 72 hours. Urine analysis: No results found for: "COLORURINE", "APPEARANCEUR", "LABSPEC", "PHURINE", "GLUCOSEU", "HGBUR", "BILIRUBINUR", "KETONESUR", "PROTEINUR", "UROBILINOGEN", "NITRITE", "LEUKOCYTESUR" Sepsis Labs: @LABRCNTIP (procalcitonin:4,lacticidven:4)  ) Recent Results (from the past 240 hour(s))  Resp Panel by RT-PCR (Flu A&B, Covid) Anterior Nasal Swab     Status: None   Collection Time: 12/16/21  2:17 PM   Specimen: Anterior Nasal Swab  Result Value Ref Range Status   SARS Coronavirus 2 by RT PCR NEGATIVE NEGATIVE Final    Comment: (NOTE) SARS-CoV-2 target nucleic acids are NOT DETECTED.  The SARS-CoV-2 RNA is generally detectable in upper respiratory specimens during the acute phase of infection. The lowest concentration of SARS-CoV-2 viral copies this assay can detect is 138 copies/mL. A negative result does not preclude SARS-Cov-2 infection and should not be used as the sole basis for treatment or other patient management decisions. A negative result may occur with  improper specimen collection/handling, submission of specimen other than nasopharyngeal swab, presence of viral mutation(s) within the areas targeted by this assay, and inadequate number of viral copies(<138 copies/mL). A negative result must be combined with clinical observations, patient history, and epidemiological information. The  expected result is Negative.  Fact Sheet for Patients:  EntrepreneurPulse.com.au  Fact Sheet for Healthcare Providers:  IncredibleEmployment.be  This test is no t yet approved or cleared by the [place] FDA and  has been authorized for detection and/or diagnosis of SARS-CoV-2 by FDA under an Emergency Use  Authorization (EUA). This EUA will remain  in effect (meaning this test can be used) for the duration of the COVID-19 declaration under Section 564(b)(1) of the Act, 21 U.S.C.section 360bbb-3(b)(1), unless the authorization is terminated  or revoked sooner.       Influenza A by PCR NEGATIVE NEGATIVE Final   Influenza B by PCR NEGATIVE NEGATIVE Final    Comment: (NOTE) The Xpert Xpress SARS-CoV-2/FLU/RSV plus assay is intended as an aid in the diagnosis of influenza from Nasopharyngeal swab specimens and should not be used as a sole basis for treatment. Nasal washings and aspirates are unacceptable for Xpert Xpress SARS-CoV-2/FLU/RSV testing.  Fact Sheet for Patients: EntrepreneurPulse.com.au  Fact Sheet for Healthcare Providers: IncredibleEmployment.be  This test is not yet approved or cleared by the [place] FDA and has been authorized for detection and/or diagnosis of SARS-CoV-2 by FDA under an Emergency Use Authorization (EUA). This EUA will remain in effect (meaning this test can be used) for the duration of the COVID-19 declaration under Section 564(b)(1) of the Act, 21 U.S.C. section 360bbb-3(b)(1), unless the authorization is terminated or revoked.  Performed at [facility], [address] [place], [place] [address]          Radiology Studies: US RENAL  Result Date: 12/16/2021 CLINICAL DATA:  Acute renal failure on stage 4 chronic kidney disease. EXAM: RENAL / URINARY TRACT ULTRASOUND COMPLETE COMPARISON:  CT 08/29/2021 FINDINGS: Right Kidney: Renal measurements: 12.9 x 4 x 4.6 cm = volume: 126 mL. Normal parenchymal echogenicity. No hydronephrosis. No visualized stone or focal lesion. Left Kidney: Renal measurements: 12.8 x 6.6 x 5.5 cm = volume: 241 mL. Mildly increased parenchymal echogenicity. No hydronephrosis. No visualized stone or focal lesion. Bladder: Appears normal for degree of bladder distention. Other: None.  IMPRESSION: 1. Mild increased left renal parenchymal echogenicity typical of chronic medical renal disease. 2. Otherwise unremarkable sonographic appearance of the kidneys and bladder. Electronically Signed   By: Keith Rake M.D.   On: 12/16/2021 21:39   DG Chest Port 1 View  Result Date: 12/16/2021 CLINICAL DATA:  Questionable sepsis. EXAM: PORTABLE CHEST 1 VIEW COMPARISON:  08/29/2021. FINDINGS: Cardiac silhouette is normal in size. Normal mediastinal and hilar contours. Stable left anterior chest wall Port-A-Cath. Clear lungs.  No pleural effusion or pneumothorax. Skeletal structures are grossly intact. IMPRESSION: No active disease. Electronically Signed   By: Lajean Manes M.D.   On: 12/16/2021 11:08        Scheduled Meds:  ARIPiprazole  5 mg Oral Daily   aspirin EC  81 mg Oral Daily   atorvastatin  40 mg Oral QHS   busPIRone  15 mg Oral BID   DULoxetine  60 mg Oral Daily   gabapentin  300 mg Oral QHS   pantoprazole  40 mg Oral BID   Continuous Infusions:  sodium bicarbonate 150 mEq in dextrose 5 % 1,150 mL infusion 100 mL/hr at 12/17/21 0431     LOS: 1 day    Time spent: 55 minutes    Dana Allan, MD  [facility] Pager #: [phone] 7PM-7AM contact night coverage as above

## 2021-12-17 NOTE — Consult Note (Signed)
[facility] Renal Consultation Note  Requesting MD: Dana Allan, MD  Indication for Consultation:  AKI, metabolic acidosis   Chief complaint: low blood sugar and n/v  HPI:  Tracy Walsh is a 56 y.o. female with a history including CKD, combined systolic and diastolic CHF, DM, bipolar disorder, HTN, and CAD who presented to [facility] with hypoglycemia at home.  She was noticing low sugar readings which were accompanied by n/v and weakness.  N/v has been going on for 3-4 days.  She has had trouble with low blood sugar for "a while" but never this low.  She has also had diarrhea recently.  She states that she has also had trouble urinating for about 2 weeks, having to strain with urination.  Per charting, EMS indicated BG was 20's on their arrival to her home.  She was transferred to [facility] with advanced CKD to facilitate access placement should HD be needed.  Her creatinine 5.90 on presentation and 6.16 today on repeat.  Nephrology is consulted for assistance with management of AKI.  She follows with Dr. Theador Hawthorne and was last seen 12/12/21.  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  She had three unmeasured urine voids over 9/3.  She does not currently have any dialysis access.  Has been compliant with home meds including lasix.   Creatinine, Ser  Date/Time Value Ref Range Status  12/17/2021 03:15 AM 6.16 (H) 0.44 - 1.00 mg/dL Final  12/16/2021 11:31 AM 5.90 (H) 0.44 - 1.00 mg/dL Final  09/28/2021 08:40 AM 2.45 (H) 0.44 - 1.00 mg/dL Final  09/01/2021 03:46 AM 2.40 (H) 0.44 - 1.00 mg/dL Final  08/31/2021 04:31 AM 2.79 (H) 0.44 - 1.00 mg/dL Final  08/30/2021 03:46 AM 3.28 (H) 0.44 - 1.00 mg/dL Final  08/29/2021 12:26 PM 3.47 (H) 0.44 - 1.00 mg/dL Final    PMHx:   Past Medical History:  Diagnosis Date   Anemia    Anxiety    Asthma    Bipolar disorder ([facility])  2018   type 2   CHF (congestive heart failure) (HCC)    CIDP (chronic inflammatory demyelinating polyneuropathy) (HCC)    CKD (chronic kidney disease)    COPD (chronic obstructive pulmonary disease) (HCC)    Depression    Diabetes mellitus without complication (HCC)    type 2   Diabetic foot ulcers (HCC)    Fibromyalgia    GERD (gastroesophageal reflux disease)    Hepatitis    Hep as a child - patient can give blood   History of blood transfusion 08/2021   1 unit per patient   History of esophagogastroduodenoscopy (EGD) 2018   History of kidney stones    passed stones   HLD (hyperlipidemia)    Hypertension    Migraines    Myocardial infarction (HCC)    x 2   Neuropathy    legs and feet - bilateral    Past Surgical History:  Procedure Laterality Date   ABDOMINAL HYSTERECTOMY     APPENDECTOMY     BILATERAL NASAL FRACTURE CLOSED REDUCTION  10/2020   CHOLECYSTECTOMY     COLONOSCOPY     CORONARY ANGIOPLASTY WITH STENT PLACEMENT  01/15/2021   FOOT SURGERY Right    I & D   I & D EXTREMITY Left 09/28/2021   Procedure: LEFT HEEL DEBRIDEMENT AND TISSUE GRAFT;  Surgeon: Newt Minion, MD;  Location: [facility]  OR;  Service: Orthopedics;  Laterality: Left;   SHOULDER SURGERY Bilateral    SPINAL FUSION     UPPER GI ENDOSCOPY      Family Hx: she denies any family history of CKD or ESRD   Social History:  reports that she has been smoking cigarettes. She has been smoking an average of .5 packs per day. She has never used smokeless tobacco. She reports that she does not drink alcohol and does not use drugs.  Allergies:  Allergies  Allergen Reactions   Amoxicillin Anaphylaxis   Clarithromycin Anaphylaxis, Diarrhea, Itching, Nausea And Vomiting and Rash   Codeine Itching   Hydrocodone Itching   Moxifloxacin Anaphylaxis, Anxiety, Hives, Itching, Nausea And Vomiting, Palpitations and Shortness Of Breath   Penicillins Anaphylaxis   Sulfa Antibiotics Anaphylaxis   Nitrofurantoin Nausea  And Vomiting   Crab (Diagnostic) Hives   Latex Hives, Itching, Rash and Swelling    Medications: Prior to Admission medications   Medication Sig Start Date End Date Taking? Authorizing Provider  albuterol (PROVENTIL) (2.5 MG/3ML) 0.083% nebulizer solution Take 2.5 mg by nebulization every 6 (six) hours as needed for wheezing or shortness of breath. 05/08/21  Yes [provider]  amitriptyline (ELAVIL) 10 MG tablet Take 1 tablet (10 mg total) by mouth 2 (two) times daily. 12/07/21 03/07/22 Yes [name], MD  ARIPiprazole (ABILIFY) 5 MG tablet Take 5 mg by mouth daily. 12/14/19  Yes [provider]  Ascorbic Acid (VITAMIN C) 1000 MG tablet Take 1,000 mg by mouth daily.   Yes [provider]  aspirin EC 81 MG tablet Take 81 mg by mouth daily. Swallow whole.   Yes [provider]  atorvastatin (LIPITOR) 40 MG tablet Take 40 mg by mouth at bedtime. 08/03/21  Yes [provider]  busPIRone (BUSPAR) 15 MG tablet Take 1 tablet (15 mg total) by mouth 2 (two) times daily. 12/07/21 03/07/22 Yes [name], MD  calcitRIOL (ROCALTROL) 0.25 MCG capsule Take 0.25 mcg by mouth every Monday, Wednesday, and Friday. 09/12/21  Yes [provider]  Cholecalciferol 25 MCG (1000 UT) tablet Take 1,000 Units by mouth daily. 05/01/21  Yes [provider]  cyanocobalamin (,VITAMIN B-12,) 1000 MCG/ML injection Inject 1,000 mcg into the muscle every 30 (thirty) days. 09/17/21  Yes [provider]  doxazosin (CARDURA) 4 MG tablet Take 4 mg by mouth daily. 08/20/21  Yes [provider]  DULoxetine (CYMBALTA) 60 MG capsule Take 1 capsule (60 mg total) by mouth daily. 12/07/21 03/07/22 Yes [name], MD  furosemide (LASIX) 80 MG tablet Take 80 mg by mouth daily. 08/09/21 08/22/22 Yes [provider]  gabapentin (NEURONTIN) 100 MG capsule Take 2 capsules (200 mg total) by mouth 2 (two) times daily. 12/07/21 03/07/22 Yes [name], MD  glimepiride (AMARYL) 2 MG tablet Take 1 tablet (2 mg total) by mouth daily before breakfast. 12/07/21 03/07/22 Yes [name], MD  hydrALAZINE (APRESOLINE) 50 MG tablet Take 50 mg by mouth 2 (two) times daily. 08/08/21  Yes [provider]  isosorbide mononitrate (IMDUR) 30 MG 24 hr tablet Take 30 mg by mouth every morning. 05/04/21  Yes [provider]  metoprolol succinate (TOPROL-XL) 50 MG 24 hr tablet Take 1 tablet (50 mg total) by mouth daily. 12/07/21 03/07/22 Yes [name], MD  Multiple Vitamins-Minerals (HAIR/SKIN/NAILS/BIOTIN PO) Take 2 each by mouth daily.   Yes [provider]  Multiple Vitamins-Minerals (MULTIVITAMIN WITH MINERALS) tablet Take 1 tablet  by mouth daily.   Yes [provider]  mupirocin ointment (BACTROBAN) 2 % Apply 1 Application topically daily. 10/31/21  Yes Suzan Slick, NP  omeprazole (PRILOSEC) 20 MG capsule Take 20 mg by mouth daily.   Yes [provider]  Probiotic Product (PROBIOTIC PO) Take 1 capsule by mouth daily.   Yes [provider]  telmisartan (MICARDIS) 20 MG tablet Take 20 mg by mouth daily. 10/10/21  Yes [provider]  zolpidem (AMBIEN) 5 MG tablet Take 1 tablet (5 mg total) by mouth at bedtime. 12/07/21 01/06/22 Yes [name], MD  ferrous sulfate 325 (65 FE) MG EC tablet Take 325 mg by mouth 2 (two) times daily. Patient not taking: Reported on 12/16/2021 02/19/21   [provider]    I have reviewed the patient's current and reported prior to admission medications.  Labs:     Latest Ref Rng & Units 12/17/2021    3:15 AM 12/16/2021   11:31 AM 09/28/2021    8:40 AM  BMP  Glucose 70 - 99 mg/dL 46  51  99   BUN 6 - 20 mg/dL 78  78  29   Creatinine 0.44 - 1.00 mg/dL 6.16  5.90  2.45   Sodium 135 - 145 mmol/L 133  131  139   Potassium 3.5 - 5.1 mmol/L 4.5  5.3  4.1   Chloride 98 - 111 mmol/L 111  111  109   CO2 22 - 32 mmol/L 13  11  20    Calcium 8.9 - 10.3  mg/dL 7.4  7.8  8.5     Urinalysis No results found for: "COLORURINE", "APPEARANCEUR", "LABSPEC", "PHURINE", "GLUCOSEU", "HGBUR", "BILIRUBINUR", "KETONESUR", "PROTEINUR", "UROBILINOGEN", "NITRITE", "LEUKOCYTESUR"   ROS:  Pertinent items noted in HPI and remainder of comprehensive ROS otherwise negative.  Physical Exam: Vitals:   12/17/21 0736 12/17/21 1139  BP: (!) 94/55 (!) 115/56  Pulse: (!) 58 69  Resp: 15 13  Temp: 97.8 F (36.6 C) 97.9 F (36.6 C)  SpO2: 97% 97%     General: adult female in bed in NAD  HEENT: NCAT  Eyes: EOMI sclera anicteric Neck: supple trachea midline  Heart: S1S2 no rub Lungs: clear and unlabored on room air  Abdomen: soft/nt/nd Extremities: 1+ edema lower extremities; no cyanosis or clubbing Skin: no rash on extremities exposed; multiple tattoos  Neuro: alert and oriented x 3 provides hx and follows commands Psych normal mood and affect  Assessment/Plan:  # AKI  - pre-renal insults and on home lasix and ARB.  Renal US no hydro with increased echogenicity - Continue gentle fluids (also for the D5 for hypoglycemia) - check post-void residual bladder scan to ensure no bladder outlet obstruction.  - Check UA and up/cr ratio - I have re-ordered strict ins/outs as not currently being charted - would need an alternative to buspirone and cymbalta as below given her advanced CKD  # CKD stage IV  - baseline Cr is near 2.5 - 3 more recently - follows with Dr. Theador Hawthorne usually  # Metabolic acidosis  - setting of AKI as well as diarrhea - she is currently on bicarb gtt   # Hyperkalemia  - improved with medical management - bicarb gtt   # Hypoglycemia - Setting of AKI; likely reduced requirement for her diabetes management  - appreciate primary team  - she is on a bicarb gtt in dextrose - continue same for now - she is off of glimeperide   # Combined  systolic and diastolic CHF  - caution with fluids - reduced D5 to 75 ml/hr  # HTN  -  controlled; mild hypotension noted previously is improved - she is off of her home meds 2/2 hypotension  # Elevated troponin - Primary team has consulted cardiology   # Normocytic anemia  - 12/06/21 outpatient labs with iron 55 and 19% sat - IV iron x 2 doses  - anticipate will need ESA    # CIPD - noted  - will need to reduce gabapentin to a max of 300 mg daily (see this is done) and would need to discuss an alternative to cymbalta with her provider (as even at her baseline renal function she would benefit from an alternate medication)    Claudia Desanctis 12/17/2021, 3:37 PM

## 2021-12-17 NOTE — Progress Notes (Signed)
Pt CBG at 2am was 55mg /dl,pt alert asymptomatic, 4 oz orange juice given.post 15 min CBG was 50mg /dl.paged dr.Rathore given 50% dextrose.will continue to moniter.

## 2021-12-18 ENCOUNTER — Inpatient Hospital Stay (HOSPITAL_COMMUNITY): Payer: 59

## 2021-12-18 DIAGNOSIS — N184 Chronic kidney disease, stage 4 (severe): Secondary | ICD-10-CM | POA: Diagnosis not present

## 2021-12-18 DIAGNOSIS — N179 Acute kidney failure, unspecified: Secondary | ICD-10-CM | POA: Diagnosis not present

## 2021-12-18 LAB — CBC
HCT: 24 % — ABNORMAL LOW (ref 36.0–46.0)
Hemoglobin: 7.5 g/dL — ABNORMAL LOW (ref 12.0–15.0)
MCH: 30.1 pg (ref 26.0–34.0)
MCHC: 31.3 g/dL (ref 30.0–36.0)
MCV: 96.4 fL (ref 80.0–100.0)
Platelets: 193 10*3/uL (ref 150–400)
RBC: 2.49 MIL/uL — ABNORMAL LOW (ref 3.87–5.11)
RDW: 15.7 % — ABNORMAL HIGH (ref 11.5–15.5)
WBC: 10.9 10*3/uL — ABNORMAL HIGH (ref 4.0–10.5)
nRBC: 0 % (ref 0.0–0.2)

## 2021-12-18 LAB — URINALYSIS, COMPLETE (UACMP) WITH MICROSCOPIC
Bilirubin Urine: NEGATIVE
Glucose, UA: NEGATIVE mg/dL
Hgb urine dipstick: NEGATIVE
Ketones, ur: NEGATIVE mg/dL
Nitrite: NEGATIVE
Protein, ur: 100 mg/dL — AB
Specific Gravity, Urine: 1.013 (ref 1.005–1.030)
pH: 5 (ref 5.0–8.0)

## 2021-12-18 LAB — GLUCOSE, CAPILLARY
Glucose-Capillary: 79 mg/dL (ref 70–99)
Glucose-Capillary: 75 mg/dL (ref 70–99)
Glucose-Capillary: 83 mg/dL (ref 70–99)
Glucose-Capillary: 86 mg/dL (ref 70–99)
Glucose-Capillary: 151 mg/dL — ABNORMAL HIGH (ref 70–99)
Glucose-Capillary: 189 mg/dL — ABNORMAL HIGH (ref 70–99)
Glucose-Capillary: 220 mg/dL — ABNORMAL HIGH (ref 70–99)
Glucose-Capillary: 201 mg/dL — ABNORMAL HIGH (ref 70–99)

## 2021-12-18 LAB — RENAL FUNCTION PANEL
Albumin: 2.5 g/dL — ABNORMAL LOW (ref 3.5–5.0)
Anion gap: 12 (ref 5–15)
BUN: 80 mg/dL — ABNORMAL HIGH (ref 6–20)
CO2: 16 mmol/L — ABNORMAL LOW (ref 22–32)
Calcium: 7.3 mg/dL — ABNORMAL LOW (ref 8.9–10.3)
Chloride: 104 mmol/L (ref 98–111)
Creatinine, Ser: 6.51 mg/dL — ABNORMAL HIGH (ref 0.44–1.00)
GFR, Estimated: 7 mL/min — ABNORMAL LOW (ref >60)
Glucose, Bld: 79 mg/dL (ref 70–99)
Phosphorus: 8.3 mg/dL — ABNORMAL HIGH (ref 2.5–4.6)
Potassium: 5 mmol/L (ref 3.5–5.1)
Sodium: 132 mmol/L — ABNORMAL LOW (ref 135–145)

## 2021-12-18 LAB — PROTEIN / CREATININE RATIO, URINE
Creatinine, Urine: 105 mg/dL
Protein Creatinine Ratio: 2.07 mg/mg{Cre} — ABNORMAL HIGH (ref 0.00–0.15)
Total Protein, Urine: 217 mg/dL

## 2021-12-18 MED ORDER — SODIUM CHLORIDE 0.9% FLUSH
10.0000 mL | INTRAVENOUS | Status: DC | PRN
  Administered 2021-12-20 – 2021-12-22 (×2): 10 mL

## 2021-12-18 MED ORDER — TECHNETIUM TO 99M ALBUMIN AGGREGATED
4.2000 | Freq: Once | INTRAVENOUS | Status: AC | PRN
Start: 2021-12-18 — End: 2021-12-18
  Administered 2021-12-18: 4.2 via INTRAVENOUS

## 2021-12-18 MED ORDER — DARBEPOETIN ALFA 60 MCG/0.3ML IJ SOSY
60.0000 ug | PREFILLED_SYRINGE | INTRAMUSCULAR | Status: DC
  Administered 2021-12-18: 60 ug via SUBCUTANEOUS
  Filled 2021-12-18: qty 0.3

## 2021-12-18 MED ORDER — OXYCODONE HCL 5 MG PO TABS
5.0000 mg | ORAL_TABLET | Freq: Once | ORAL | Status: AC
  Administered 2021-12-18: 5 mg via ORAL
  Filled 2021-12-18: qty 1

## 2021-12-18 MED ORDER — SEVELAMER CARBONATE 800 MG PO TABS
800.0000 mg | ORAL_TABLET | Freq: Three times a day (TID) | ORAL | Status: DC
  Administered 2021-12-18 – 2021-12-22 (×6): 800 mg via ORAL
  Filled 2021-12-18 (×7): qty 1

## 2021-12-18 MED ORDER — CHLORHEXIDINE GLUCONATE CLOTH 2 % EX PADS
6.0000 | MEDICATED_PAD | Freq: Every day | CUTANEOUS | Status: DC
  Administered 2021-12-18 – 2021-12-22 (×5): 6 via TOPICAL

## 2021-12-18 MED ORDER — HEPARIN SODIUM (PORCINE) 5000 UNIT/ML IJ SOLN
5000.0000 [IU] | Freq: Three times a day (TID) | INTRAMUSCULAR | Status: DC
  Administered 2021-12-18 – 2021-12-22 (×11): 5000 [IU] via SUBCUTANEOUS
  Filled 2021-12-18 (×12): qty 1

## 2021-12-18 MED ORDER — SODIUM CHLORIDE 0.9% FLUSH
10.0000 mL | Freq: Two times a day (BID) | INTRAVENOUS | Status: DC
  Administered 2021-12-19 – 2021-12-22 (×7): 10 mL

## 2021-12-18 MED ORDER — SUMATRIPTAN SUCCINATE 6 MG/0.5ML ~~LOC~~ SOLN
6.0000 mg | SUBCUTANEOUS | Status: AC | PRN
  Administered 2021-12-18 (×2): 6 mg via SUBCUTANEOUS
  Filled 2021-12-18 (×4): qty 0.5

## 2021-12-18 NOTE — Progress Notes (Signed)
Spoke with Dr. Royce Macadamia about patient only having port for access and MD stated it's okay to hold bicarb infusion while ferrlecit infuses and then restart bicarb infusion.

## 2021-12-18 NOTE — Progress Notes (Signed)
Insurance now only covers Procrit or Aranesp.  Orders received to change Retacrit to Procrit 20,000 units q 2 weeks.   Plan updated.  Imagene Gurney, PA-C/Loreto Loescher Ronnald Ramp, PharmD 12/18/21 @ 1200

## 2021-12-18 NOTE — Evaluation (Signed)
Occupational Therapy Evaluation Patient Details Name: Tracy Walsh MRN: [id number] DOB: September 29, 1965 Today's Date: 12/18/2021   History of Present Illness Pt is a 56 yr old who presented 12/16/21 due to hypoglycemia. PMH  stage IV renal failure, hypertension, hyperlipidemia, combined systolic/diastolic heart failure (ejection fraction 40-45%), coronary artery disease, depression/anxiety, migraines, neuropathy, spinal fustion, shoulder sx, C diff   Clinical Impression   Pt reported at PLOF they have husband assist with LB ADLs and transfers/bed mobility as needed. Pt reports in home level they use power scooter but it is unable to enter into the bathroom area. Pt at this time was able to complete UE post set up in sitting and required moderate assist with BLE. Pt was educated about AE for LE ADLS when husband may be out of the home and voiced an underastanding. Pt currently with functional limitations due to the deficits listed below (see OT Problem List).  Pt will benefit from skilled OT to increase their safety and independence with ADL and functional mobility for ADL to facilitate discharge to venue listed below.        Recommendations for follow up therapy are one component of a multi-disciplinary discharge planning process, led by the attending physician.  Recommendations may be updated based on patient status, additional functional criteria and insurance authorization.   Follow Up Recommendations  No OT follow up    Assistance Recommended at Discharge Intermittent Supervision/Assistance  Patient can return home with the following A little help with walking and/or transfers;A little help with bathing/dressing/bathroom;Assistance with cooking/housework;Assist for transportation    Functional Status Assessment  Patient has had a recent decline in their functional status and demonstrates the ability to make significant improvements in function in a reasonable and predictable amount of time.   Equipment Recommendations  Toilet rise with handles    Recommendations for Other Services       Precautions / Restrictions Precautions Precautions: Fall Precaution Comments: Pt reported a hx of falls but not in the last 2 months. Pt reports they have decrease in sensation in BLE and they will have weakness feeling like "Jello". Restrictions Weight Bearing Restrictions: No      Mobility Bed Mobility Overal bed mobility: Needs Assistance Bed Mobility: Supine to Sit, Sit to Supine     Supine to sit: HOB elevated (use of bed rail) Sit to supine: Modified independent (Device/Increase time), HOB elevated        Transfers Overall transfer level: Needs assistance Equipment used: Rolling walker (2 wheels) Transfers: Sit to/from Stand Sit to Stand: Min guard           General transfer comment: increase in time and cued to complete BLE WB prior to taking a step due to decrease in sensation in BLE      Balance Overall balance assessment: Needs assistance Sitting-balance support: Feet supported, No upper extremity supported Sitting balance-Leahy Scale: Good     Standing balance support: Bilateral upper extremity supported, During functional activity Standing balance-Leahy Scale: Fair                             ADL either performed or assessed with clinical judgement   ADL Overall ADL's : Needs assistance/impaired Eating/Feeding: Independent;Sitting   Grooming: Wash/dry hands;Wash/dry face;Sitting;Set up   Upper Body Bathing: Set up;Sitting   Lower Body Bathing: Moderate assistance;Cueing for safety;Cueing for sequencing;Sit to/from stand;Sitting/lateral leans   Upper Body Dressing : Set up;Sitting   Lower Body Dressing: Moderate  assistance;Cueing for safety;Cueing for sequencing;Sit to/from stand   Toilet Transfer: Set up;Cueing for safety;Cueing for sequencing;Rolling walker (2 wheels)   Toileting- Clothing Manipulation and Hygiene: Moderate  assistance;Cueing for safety;Cueing for sequencing;Sit to/from stand       Functional mobility during ADLs: Min guard;Rolling walker (2 wheels)       Vision Baseline Vision/History: 0 No visual deficits Ability to See in Adequate Light: 0 Adequate       Perception     Praxis      Pertinent Vitals/Pain Pain Assessment Pain Assessment: 0-10 Pain Score: 1  Pain Location: headache Pain Descriptors / Indicators: Aching Pain Intervention(s): Limited activity within patient's tolerance, Patient requesting pain meds-RN notified     Hand Dominance     Extremity/Trunk Assessment Upper Extremity Assessment Upper Extremity Assessment: Overall WFL for tasks assessed (Pt has hx B shoulder replacements but able to complete ADLs)   Lower Extremity Assessment Lower Extremity Assessment: Defer to PT evaluation   Cervical / Trunk Assessment Cervical / Trunk Assessment: Kyphotic   Communication Communication Communication: No difficulties   Cognition Arousal/Alertness: Awake/alert Behavior During Therapy: WFL for tasks assessed/performed Overall Cognitive Status: Within Functional Limits for tasks assessed                                       General Comments       Exercises     Shoulder Instructions      Home Living Family/patient expects to be discharged to:: Private residence Living Arrangements: Spouse/significant other Available Help at Discharge: Family Type of Home: House       Home Layout: One level     Bathroom Shower/Tub: Tub/shower unit;Door   [employer] Toilet: Standard Bathroom Accessibility: No   Home Equipment: Conservation officer, nature (2 wheels);Cane - single point;Shower seat;Electric scooter   Additional Comments: Pt reports they can not use power scooter in the home so they drive it to the door then walk into the bathroom area. Uses a lift chair during the day.      Prior Functioning/Environment Prior Level of Function : Needs assist        Physical Assist : Mobility (physical);ADLs (physical) Mobility (physical): Transfers ADLs (physical): Bathing;Dressing Mobility Comments: husband assists with transfers as needed but mostly from bed level ADLs Comments: Pt's husband completes LB ADSL        OT Problem List: Decreased strength;Decreased activity tolerance;Impaired balance (sitting and/or standing);Decreased safety awareness;Decreased knowledge of use of DME or AE;Cardiopulmonary status limiting activity;Pain      OT Treatment/Interventions: Self-care/ADL training;Therapeutic exercise;DME and/or AE instruction;Therapeutic activities;Patient/family education;Balance training    OT Goals(Current goals can be found in the care plan section) Acute Rehab OT Goals Patient Stated Goal: to go home to dog and husband OT Goal Formulation: With patient Time For Goal Achievement: 01/01/22 Potential to Achieve Goals: Good ADL Goals Pt Will Transfer to Toilet: with modified independence;ambulating Pt Will Perform Tub/Shower Transfer: Tub transfer;with supervision;shower seat;rolling walker Additional ADL Goal #1: Pt will tolerate 10 mins of standing ADLS to increase in tolerance for ADLs at sink level in the home.  OT Frequency: Min 2X/week    Co-evaluation              AM-PAC OT "6 Clicks" Daily Activity     Outcome Measure Help from another person eating meals?: None Help from another person taking care of personal grooming?: None Help from another  person toileting, which includes using toliet, bedpan, or urinal?: A Little Help from another person bathing (including washing, rinsing, drying)?: A Lot Help from another person to put on and taking off regular upper body clothing?: A Little Help from another person to put on and taking off regular lower body clothing?: A Lot 6 Click Score: 18   End of Session Equipment Utilized During Treatment: Gait belt;Rolling walker (2 wheels) Nurse Communication: Patient  requests pain meds  Activity Tolerance: Patient tolerated treatment well Patient left: in bed;with call bell/phone within reach;with family/visitor present;with bed alarm set  OT Visit Diagnosis: Unsteadiness on feet (R26.81);Other abnormalities of gait and mobility (R26.89);Repeated falls (R29.6);Muscle weakness (generalized) (M62.81);History of falling (Z91.81);Pain Pain - Right/Left:  (head)                Time: 3545-6256 OT Time Calculation (min): 32 min Charges:  OT General Charges $OT Visit: 1 Visit OT Evaluation $OT Eval Low Complexity: 1 Low OT Treatments $Self Care/Home Management : 8-22 mins  Joeseph Amor OTR/L  Acute Rehab Services  [phone] office number [phone] pager number   Joeseph Amor 12/18/2021, 10:00 AM

## 2021-12-18 NOTE — Progress Notes (Signed)
Sent secure chat to Dr. Marthenia Rolling and made MD aware that patient's been c/o headache overnight that is unrelieved by tylenol and that patient reports at home she takes hydrocodone and that in the hospital sometimes they have to give her hydrocodone, phenergan and benadryl if it becomes a migraine. Also asked MD about changing q2H cbg since patient has been stable with blood sugars. MD acknowledged and only ordered for CBG to be changed to q4H. No orders regarding headache at this time.

## 2021-12-18 NOTE — Progress Notes (Signed)
[facility] Progress Note  Name: Tracy Walsh MRN: [id number] DOB: 21-Feb-1966  Chief Complaint:  Hypoglycemia and weakness  Subjective:  strict ins/outs are not available.  She had 450 mL uop as well as one unmeasured urine void over 9/4.  She had been planned for starting ESA outpatient - she and I discussed the risks/benefits/indications for ESA and she is willing to proceed.  She had planned to get a dose on 9/7 outpatient.  She denies any history of active cancer.  Updated her husband at bedside.  Just ambulated to restroom.  She and I discussed worsening renal failure and she would want dialysis if it were indicated.   Per charting had in/out cath today   Review of systems:  Denies shortness of breath  Denies n/v Denies chest pain  ------------ Background on consult:  Tracy Walsh is a 56 y.o. female with a history including CKD, combined systolic and diastolic CHF, DM, bipolar disorder, HTN, and CAD who presented to [facility] with hypoglycemia at home.  She was noticing low sugar readings which were accompanied by n/v and weakness.  N/v has been going on for 3-4 days.  She has had trouble with low blood sugar for "a while" but never this low.  She has also had diarrhea recently.  She states that she has also had trouble urinating for about 2 weeks, having to strain with urination.  Per charting, EMS indicated BG was 20's on their arrival to her home.  She was transferred to [facility] with advanced CKD to facilitate access placement should HD be needed.  Her creatinine 5.90 on presentation and 6.16 today on repeat.  Nephrology is consulted for assistance with management of AKI.  She follows with Dr. Theador Hawthorne and was last seen 12/12/21.  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  She had three unmeasured urine voids over 9/3.  She does not currently have any dialysis  access.  Has been compliant with home meds including lasix.     Intake/Output Summary (Last 24 hours) at 12/18/2021 1232 Last data filed at 12/18/2021 0600 Gross per 24 hour  Intake --  Output 450 ml  Net -450 ml    Vitals:  Vitals:   12/17/21 1913 12/17/21 2359 12/18/21 0351 12/18/21 0800  BP: (!) 128/57 114/76  (!) 117/58  Pulse:    75  Resp:    17  Temp: 97.7 F (36.5 C) (!) 97.4 F (36.3 C) 97.8 F (36.6 C) 97.9 F (36.6 C)  TempSrc: Oral Oral Oral Oral  SpO2:    97%  Weight:      Height:         Physical Exam:    General: adult female in bed in NAD  HEENT: NCAT  Eyes: EOMI sclera anicteric Neck: supple trachea midline  Heart: S1S2 no rub Lungs: clear and unlabored on room air  Abdomen: soft/nt/nd Extremities: trace edema lower extremities; no cyanosis or clubbing Neuro: alert and oriented x 3 provides hx and follows commands Psych normal mood and affect  Medications reviewed   Labs:     Latest Ref Rng & Units 12/18/2021    3:05 AM 12/17/2021    3:15 AM 12/16/2021   11:31 AM  BMP  Glucose 70 - 99 mg/dL 79  46  51   BUN 6 - 20 mg/dL 80  78  78   Creatinine 0.44 -  1.00 mg/dL 6.51  6.16  5.90   Sodium 135 - 145 mmol/L 132  133  131   Potassium 3.5 - 5.1 mmol/L 5.0  4.5  5.3   Chloride 98 - 111 mmol/L 104  111  111   CO2 22 - 32 mmol/L 16  13  11    Calcium 8.9 - 10.3 mg/dL 7.3  7.4  7.8      Assessment/Plan:   # AKI  - pre-renal insults and on home lasix and ARB.  Renal US no hydro with increased echogenicity.  UA with 100 mg/dL proteinuria and had up/cr ratio 2070 mg/g on up/cr ratio.  Urinary retention noted in the setting of worsening AKI - hopeful for plateau   - Continue gentle fluids (also for the D5 for hypoglycemia) - place a foley catheter - increase bicarb gtt to 100 ml/hr - I have re-ordered strict ins/outs as not currently being charted - changed to renal diet  - would need an alternative to buspirone and cymbalta as below given her advanced  CKD   # CKD stage IV  - renal biopsy 12/01/21 and per nephrology note the biopsy demonstrated 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 - baseline Cr is near 2.5 - 3 more recently - follows with Dr. Theador Hawthorne    # Metabolic acidosis  - setting of AKI as well as diarrhea - she is currently on bicarb gtt with improvement    # Hyperkalemia  - improved with medical management - bicarb gtt    # Hypoglycemia - Setting of AKI; likely reduced requirement for her diabetes management  - appreciate primary team  - she is on a bicarb gtt in dextrose - continue same for now - she is off of glimeperide    # Combined systolic and diastolic CHF  - caution with fluids - bicarb gtt     # HTN  - controlled; mild hypotension noted previously is improved - she is off of her home meds 2/2 hypotension   # Elevated troponin - Primary team consulted cardiology    # Normocytic anemia   - 12/06/21 outpatient labs with iron 55 and 19% sat - IV iron x 2 doses  - start aranesp 60 mcg weekly - she was supposed to start retacrit on 9/7 per outpatient nephrologist   # CIPD - noted  - will need to reduce gabapentin to a max of 300 mg daily (see this is done) and would need to discuss an alternative to cymbalta with her provider (as even at her baseline renal function she would benefit from an alternate medication)   # Hyperphosphatemia  - transition to renal diet  - start renvela with meals   Disposition - please continue inpatient monitoring     Claudia Desanctis, MD 12/18/2021 1:03 PM

## 2021-12-18 NOTE — Progress Notes (Signed)
Spoke with Dr. Marthenia Rolling via secure chat and MD gave order for 5mg  oxycodone PO once and gave order for 1 view chest xray to be done prior to lung scan per radiologist request.

## 2021-12-18 NOTE — Plan of Care (Signed)
  Problem: Education: Goal: Knowledge of General Education information will improve Description: Including pain rating scale, medication(s)/side effects and non-pharmacologic comfort measures Outcome: Progressing   Problem: Activity: Goal: Risk for activity intolerance will decrease Outcome: Progressing   Problem: Nutrition: Goal: Adequate nutrition will be maintained Outcome: Progressing   Problem: Elimination: Goal: Will not experience complications related to bowel motility Outcome: Progressing   Problem: Safety: Goal: Ability to remain free from injury will improve Outcome: Progressing   Problem: Skin Integrity: Goal: Risk for impaired skin integrity will decrease Outcome: Progressing

## 2021-12-18 NOTE — Progress Notes (Signed)
PROGRESS NOTE    Tracy Walsh  ZLD:[id number] DOB: [birth year] DOA: 12/16/2021 PCP: [name], MD  Outpatient Specialists:     Brief Narrative:  Patient is a 56 year old Caucasian female with baseline chronic kidney disease stage IV, diabetes mellitus, hyperlipidemia, combined systolic and diastolic congestive heart failure with documented EF of 40 to 45%, coronary artery disease, CIDP, and neuropathy and chronic diarrhea.  According to the patient and patient's husband, patient has had diarrhea for over a year, with history of recurrent C. difficile infection.  Recent worsening of diarrhea was reported by the patient.  Whilst asleep, patient was noted to be sweating significantly and talking.  Patient's blood sugar was checked by the husband and found to be 20.  Patient was brought to the hospital for further assessment and management.  On presentation, patient was found to be in acute kidney injury on chronic kidney disease stage IV, mildly hyperkalemia, elevated troponin and severe metabolic acidosis.  Significant peripheral edema (edema of the lower extremities) noted.  Patient is currently on bicarb drip/D5.  Apparently, patient was on insulin prior to presentation.  Hyperkalemia has responded to medical treatment.  Nephrology input is appreciated.  Cardiology team is also being consulted.  12/17/2021: Patient seen alongside patient's husband.  No new complaints today. 12/18/2021: Patient seen alongside patient's husband.  Patient reports migraine headache.  Nephrology input is appreciated.   Assessment & Plan:   Principal Problem:   Acute renal failure superimposed on stage 4 chronic kidney disease, unspecified acute renal failure type (HCC) Active Problems:   Essential hypertension   Uncontrolled type 2 diabetes mellitus with hyperglycemia, without long-term current use of insulin (HCC)   CAD (coronary artery disease)   Chronic combined systolic and diastolic CHF (congestive heart  failure) (HCC)   CIDP (chronic inflammatory demyelinating polyneuropathy) (HCC)   Metabolic acidosis   Elevated troponin   Hyperkalemia   Nausea & vomiting   GERD (gastroesophageal reflux disease)   Depression with anxiety   HLD (hyperlipidemia)   * Acute renal failure superimposed on stage 4 chronic kidney disease, unspecified acute renal failure type ([place]) - Patient with stage IV renal failure at baseline -Significant worsening in her condition with a creatinine reaching 5.90 and associated severe metabolic acidosis -Will avoid nephrotoxic agents, hypotension and contrast; patient's medications will be adjusted to current renal function -Fluid resuscitation with sodium bicarbonate has been started -Checking urinalysis and renal ultrasound. -Nephrology service has been consulted by emergency department physician with recommendations to transfer patient to [facility] as she is a high risk for ended requiring dialysis and no availability for access placement at [facility] at least until Tuesday (12/18/2021). 12/17/2021: Acute kidney injury is likely secondary to volume loss and relative hypotension.  Nephrology team has been consulted.  Cautious hydration. 12/18/2021: Nephrology team is directing care.   HLD (hyperlipidemia) -continue statin   Depression with anxiety -no SI or hallucinations -resume home antidepressant and anxiolytics meds.    GERD (gastroesophageal reflux disease) - Continue PPI. -dose adjusted to BID for better assistance relieving symptoms.    Nausea & vomiting - Most likely associated with hypoglycemic event -Will provide as needed antiemetics -Continue PPI -Follow clinical response maintain adequate hydration. 12/17/2021: Resolved.   Hyperkalemia - Treated with sodium bicarbonate, IV fluids, Lasix x1 dose and Lokelma -Most likely in the setting of worsening renal function -Follow electrolytes trend -Continue telemetry monitoring. 12/17/2021: Mild, resolved with  medical treatment.   Elevated troponin - In the setting of demand  ischemia versus worsening renal function -Patient with a heart score of 4-5 and significant risk factors. -Will continue to cycle troponin -Telemetry monitoring requested -Holding beta-blockers or any other agents in the setting of soft blood pressure and acute renal failure. -will resume aspirin and statin 12/17/2021: Cardiology team has been consulted.  No chest pain reported.   Metabolic acidosis - In the setting of worsening renal function -Patient has been started on sodium bicarbonate -Follow bicarb levels and renal function trend and stability. 12/17/2021: Likely secondary to worsening diarrhea and acute kidney injury.   CIDP (chronic inflammatory demyelinating polyneuropathy) (HCC) -continue cymbalta and adjusted dose of neuronitn.   Chronic combined systolic and diastolic CHF (congestive heart failure) (HCC) - Currently holding antihypertensives and diuretics agents in the setting of soft blood pressure and worsening renal function -Follow daily weights and strict intake and output -Most recent echo with ejection fraction 40-45% (March 2023). -Cardiology was consulted and expressed that they can follow patient when she arrived to come in consultation.   CAD (coronary artery disease) - Currently denied chest pain, but expressing shortness of breath -Patient with significant risk factors and elevated troponin on presentation -Case was briefly discussed with cardiologist on-call (Dr. Terrence Dupont) who felt patient troponin elevation most likely associated with worsening renal function. -Patient was initially started on heparin drip by EDP, second troponin has started to trend down -No acute ischemic changes on EKG. -Continue telemetry monitoring -Adequately continue to replete electrolytes -Follow daily weights, strict I's and O's and cardiology will see patient in consultation when she arrived to come. -Patient with  recent echocardiogram in March 2023 demonstrating ejection fraction 40-45%; will hold on repeating echo at this moment and allow cardiology to determine if needed to further evaluate for any wall motion abnormalities. -continue ASA and statin   Uncontrolled type 2 diabetes mellitus with hyperglycemia, without long-term current use of insulin ([place]) - Presenting with severe hypoglycemia -At this moment holding oral hypoglycemic agents -Patient experiencing ongoing nausea/vomiting and having difficulty taking by mouth. -Follow CBGs every 4 hours -Receiving IV dextrose along with sodium bicarbonate. -Update A1c 12/17/2021: Admitted with hypoglycemia.  Continue to monitor blood sugar.  Optimize insulin therapy on discharge.   Essential hypertension - Currently soft blood pressure -Patient with worsening renal function -Holding antihypertensive agents at this moment -Maintaining adequate hydration -Follow vital signs. 12/17/2021: Patient is currently mildly hypotensive.  Monitor closely.   Hyperphosphatemia -In the setting of worsening renal function -Continue to follow electrolytes trend -Follow nephrology recommendations.   Migraine headache: -Subcutaneous sumatriptan.   DVT prophylaxis: Subcutaneous heparin Code Status: Full code Family Communication: Husband Disposition Plan: Home eventually   Consultants:  Nephrology. Cardiology  Procedures:  None  Antimicrobials:  None   Subjective: Reports headache.  Objective: Vitals:   12/17/21 1913 12/17/21 2359 12/18/21 0351 12/18/21 0800  BP: (!) 128/57 114/76  (!) 117/58  Pulse:    75  Resp:    17  Temp: 97.7 F (36.5 C) (!) 97.4 F (36.3 C) 97.8 F (36.6 C) 97.9 F (36.6 C)  TempSrc: Oral Oral Oral Oral  SpO2:    97%  Weight:      Height:        Intake/Output Summary (Last 24 hours) at 12/18/2021 1105 Last data filed at 12/18/2021 0600 Gross per 24 hour  Intake --  Output 450 ml  Net -450 ml    Filed Weights    12/16/21 1142  Weight: 90.7 kg    Examination:  General  exam: Appears calm and comfortable  Respiratory system: Clear to auscultation. Respiratory effort normal. Cardiovascular system: S1 & S2 heard, RRR. No JVD, murmurs, rubs, gallops or clicks. No pedal edema. Gastrointestinal system: Abdomen is nondistended, soft and nontender. No organomegaly or masses felt. Normal bowel sounds heard. Central nervous system: Alert and oriented. No focal neurological deficits. Extremities: Symmetric 5 x 5 power. Skin: No rashes, lesions or ulcers Psychiatry: Judgement and insight appear normal. Mood & affect appropriate.     Data Reviewed: I have personally reviewed following labs and imaging studies  CBC: Recent Labs  Lab 12/16/21 1131 12/17/21 0315 12/18/21 0305  WBC 11.4* 8.5 10.9*  NEUTROABS 9.9*  --   --   HGB 8.4* 7.7* 7.5*  HCT 27.1* 24.0* 24.0*  MCV 98.5 96.4 96.4  PLT 203 204 097    Basic Metabolic Panel: Recent Labs  Lab 12/16/21 1131 12/16/21 1513 12/17/21 0315 12/18/21 0305  NA 131*  --  133* 132*  K 5.3*  --  4.5 5.0  CL 111  --  111 104  CO2 11*  --  13* 16*  GLUCOSE 51*  --  46* 79  BUN 78*  --  78* 80*  CREATININE 5.90*  --  6.16* 6.51*  CALCIUM 7.8*  --  7.4* 7.3*  MG  --  1.8  --   --   PHOS  --  8.7*  --  8.3*    GFR: Estimated Creatinine Clearance: 11.6 mL/min (A) (by C-G formula based on SCr of 6.51 mg/dL (H)). Liver Function Tests: Recent Labs  Lab 12/16/21 1131 12/17/21 0315 12/18/21 0305  AST 27 33  --   ALT 34 32  --   ALKPHOS 149* 129*  --   BILITOT 0.6 0.4  --   PROT 6.7 5.8*  --   ALBUMIN 3.0* 2.7* 2.5*    No results for input(s): "LIPASE", "AMYLASE" in the last 168 hours. No results for input(s): "AMMONIA" in the last 168 hours. Coagulation Profile: No results for input(s): "INR", "PROTIME" in the last 168 hours. Cardiac Enzymes: Recent Labs  Lab 12/16/21 1424  CKTOTAL 166    BNP (last 3 results) No results for  input(s): "PROBNP" in the last 8760 hours. HbA1C: Recent Labs    12/16/21 1514  HGBA1C 6.3*    CBG: Recent Labs  Lab 12/17/21 2354 12/18/21 0154 12/18/21 0354 12/18/21 0608 12/18/21 0822  GLUCAP 85 79 75 83 86    Lipid Profile: No results for input(s): "CHOL", "HDL", "LDLCALC", "TRIG", "CHOLHDL", "LDLDIRECT" in the last 72 hours. Thyroid Function Tests: No results for input(s): "TSH", "T4TOTAL", "FREET4", "T3FREE", "THYROIDAB" in the last 72 hours. Anemia Panel: No results for input(s): "VITAMINB12", "FOLATE", "FERRITIN", "TIBC", "IRON", "RETICCTPCT" in the last 72 hours. Urine analysis:    Component Value Date/Time   COLORURINE YELLOW 12/18/2021 0700   APPEARANCEUR HAZY (A) 12/18/2021 0700   LABSPEC 1.013 12/18/2021 0700   PHURINE 5.0 12/18/2021 0700   GLUCOSEU NEGATIVE 12/18/2021 0700   HGBUR NEGATIVE 12/18/2021 0700   BILIRUBINUR NEGATIVE 12/18/2021 0700   KETONESUR NEGATIVE 12/18/2021 0700   PROTEINUR 100 (A) 12/18/2021 0700   NITRITE NEGATIVE 12/18/2021 0700   LEUKOCYTESUR SMALL (A) 12/18/2021 0700   Sepsis Labs: @LABRCNTIP (procalcitonin:4,lacticidven:4)  ) Recent Results (from the past 240 hour(s))  Resp Panel by RT-PCR (Flu A&B, Covid) Anterior Nasal Swab     Status: None   Collection Time: 12/16/21  2:17 PM   Specimen: Anterior Nasal Swab  Result Value Ref  Range Status   SARS Coronavirus 2 by RT PCR NEGATIVE NEGATIVE Final    Comment: (NOTE) SARS-CoV-2 target nucleic acids are NOT DETECTED.  The SARS-CoV-2 RNA is generally detectable in upper respiratory specimens during the acute phase of infection. The lowest concentration of SARS-CoV-2 viral copies this assay can detect is 138 copies/mL. A negative result does not preclude SARS-Cov-2 infection and should not be used as the sole basis for treatment or other patient management decisions. A negative result may occur with  improper specimen collection/handling, submission of specimen other than  nasopharyngeal swab, presence of viral mutation(s) within the areas targeted by this assay, and inadequate number of viral copies(<138 copies/mL). A negative result must be combined with clinical observations, patient history, and epidemiological information. The expected result is Negative.  Fact Sheet for Patients:  EntrepreneurPulse.com.au  Fact Sheet for Healthcare Providers:  IncredibleEmployment.be  This test is no t yet approved or cleared by the [place] FDA and  has been authorized for detection and/or diagnosis of SARS-CoV-2 by FDA under an Emergency Use Authorization (EUA). This EUA will remain  in effect (meaning this test can be used) for the duration of the COVID-19 declaration under Section 564(b)(1) of the Act, 21 U.S.C.section 360bbb-3(b)(1), unless the authorization is terminated  or revoked sooner.       Influenza A by PCR NEGATIVE NEGATIVE Final   Influenza B by PCR NEGATIVE NEGATIVE Final    Comment: (NOTE) The Xpert Xpress SARS-CoV-2/FLU/RSV plus assay is intended as an aid in the diagnosis of influenza from Nasopharyngeal swab specimens and should not be used as a sole basis for treatment. Nasal washings and aspirates are unacceptable for Xpert Xpress SARS-CoV-2/FLU/RSV testing.  Fact Sheet for Patients: EntrepreneurPulse.com.au  Fact Sheet for Healthcare Providers: IncredibleEmployment.be  This test is not yet approved or cleared by the [place] FDA and has been authorized for detection and/or diagnosis of SARS-CoV-2 by FDA under an Emergency Use Authorization (EUA). This EUA will remain in effect (meaning this test can be used) for the duration of the COVID-19 declaration under Section 564(b)(1) of the Act, 21 U.S.C. section 360bbb-3(b)(1), unless the authorization is terminated or revoked.  Performed at [facility], [address] [place], [place] [address]           Radiology Studies: DG CHEST PORT 1 VIEW  Result Date: 12/18/2021 CLINICAL DATA:  Asthma, CHF, COPD EXAM: PORTABLE CHEST 1 VIEW COMPARISON:  12/16/2021 FINDINGS: Mild cardiomegaly. Left chest port catheter. Both lungs are clear. The visualized skeletal structures are unremarkable. IMPRESSION: Mild cardiomegaly. No acute abnormality of the lungs. Electronically Signed   By: Delanna Ahmadi M.D.   On: 12/18/2021 10:51   US RENAL  Result Date: 12/16/2021 CLINICAL DATA:  Acute renal failure on stage 4 chronic kidney disease. EXAM: RENAL / URINARY TRACT ULTRASOUND COMPLETE COMPARISON:  CT 08/29/2021 FINDINGS: Right Kidney: Renal measurements: 12.9 x 4 x 4.6 cm = volume: 126 mL. Normal parenchymal echogenicity. No hydronephrosis. No visualized stone or focal lesion. Left Kidney: Renal measurements: 12.8 x 6.6 x 5.5 cm = volume: 241 mL. Mildly increased parenchymal echogenicity. No hydronephrosis. No visualized stone or focal lesion. Bladder: Appears normal for degree of bladder distention. Other: None. IMPRESSION: 1. Mild increased left renal parenchymal echogenicity typical of chronic medical renal disease. 2. Otherwise unremarkable sonographic appearance of the kidneys and bladder. Electronically Signed   By: Keith Rake M.D.   On: 12/16/2021 21:39   DG Chest Port 1 View  Result Date:  12/16/2021 CLINICAL DATA:  Questionable sepsis. EXAM: PORTABLE CHEST 1 VIEW COMPARISON:  08/29/2021. FINDINGS: Cardiac silhouette is normal in size. Normal mediastinal and hilar contours. Stable left anterior chest wall Port-A-Cath. Clear lungs.  No pleural effusion or pneumothorax. Skeletal structures are grossly intact. IMPRESSION: No active disease. Electronically Signed   By: Lajean Manes M.D.   On: 12/16/2021 11:08        Scheduled Meds:  ARIPiprazole  5 mg Oral Daily   aspirin EC  81 mg Oral Daily   atorvastatin  40 mg Oral QHS   busPIRone  15 mg Oral BID   Chlorhexidine Gluconate Cloth  6 each  Topical Daily   DULoxetine  60 mg Oral Daily   gabapentin  300 mg Oral QHS   heparin injection (subcutaneous)  5,000 Units Subcutaneous Q8H   pantoprazole  40 mg Oral BID   Continuous Infusions:  ferric gluconate (FERRLECIT) IVPB 250 mg (12/17/21 1707)   sodium bicarbonate 150 mEq in dextrose 5 % 1,150 mL infusion 75 mL/hr at 12/18/21 1013     LOS: 2 days    Time spent: 35 minutes    Dana Allan, MD  [facility] Pager #: [phone] 7PM-7AM contact night coverage as above

## 2021-12-18 NOTE — Evaluation (Signed)
Physical Therapy Evaluation Patient Details Name: Tracy Walsh MRN: [id number] DOB: March 02, 1966 Today's Date: 12/18/2021  History of Present Illness  Pt is a 56 yr old who presented 12/16/21 due to hypoglycemia. 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 Pt reports use of scooter in community and home, however does not fit in bathroom, uses furniture and RW for support with the short distance ambulation she does within the home. Pt's husband assists with bathing and dressing and performs most iADLs. Pt is currently limited in safe mobility by decreased strength and endurance. Pt is mod I for bed mobility and min guard for transfers and very short distance ambulation with RW. PT recommending HHPT at discharge. PT will continue to follow acutely.     Recommendations for follow up therapy are one component of a multi-disciplinary discharge planning process, led by the attending physician.  Recommendations may be updated based on patient status, additional functional criteria and insurance authorization.  Follow Up Recommendations Home health PT      Assistance Recommended at Discharge Intermittent Supervision/Assistance  Patient can return home with the following  A little help with walking and/or transfers;Assistance with cooking/housework;A lot of help with bathing/dressing/bathroom;Direct supervision/assist for medications management;Direct supervision/assist for financial management;Assist for transportation;Help with stairs or ramp for entrance    Equipment Recommendations None recommended by PT     Functional Status Assessment Patient has had a recent decline in their functional status and demonstrates the ability to  make significant improvements in function in a reasonable and predictable amount of time.     Precautions / Restrictions Precautions Precautions: Fall Precaution Comments: Pt reported a hx of falls but not in the last 2 months. Pt reports they have decrease in sensation in BLE and they will have weakness feeling like "Jello". Restrictions Weight Bearing Restrictions: No      Mobility  Bed Mobility Overal bed mobility: Needs Assistance Bed Mobility: Supine to Sit, Sit to Supine     Supine to sit: HOB elevated (use of bed rail) Sit to supine: Modified independent (Device/Increase time), HOB elevated        Transfers Overall transfer level: Needs assistance Equipment used: Rolling walker (2 wheels) Transfers: Sit to/from Stand Sit to Stand: Min guard           General transfer comment: increase in time and cued to complete BLE WB prior to taking a step due to decrease in sensation in BLE    Ambulation/Gait Ambulation/Gait assistance: Min guard Gait Distance (Feet): 20 Feet Assistive device: Rolling walker (2 wheels) Gait Pattern/deviations: Step-through pattern, Decreased step length - right, Decreased step length - left, Trunk flexed, Shuffle Gait velocity: slowed Gait velocity interpretation: <1.31 ft/sec, indicative of household ambulator   General Gait Details: min guard for slowed shuffling gait, mildly unsteady, no overt LoB        Balance Overall balance assessment: Needs assistance Sitting-balance support: Feet supported, No upper extremity supported Sitting balance-Leahy Scale: Good     Standing balance support: Bilateral upper extremity supported, During functional activity Standing balance-Leahy Scale: Fair                               Pertinent Vitals/Pain Pain Assessment Pain Assessment: 0-10 Pain Score: 4  Pain Location: headache Pain Descriptors / Indicators: Aching Pain Intervention(s): Limited activity within patient's  tolerance, Monitored during session, Repositioned    Home Living Family/patient expects to be discharged to:: Private residence Living Arrangements: Spouse/significant other Available Help at Discharge: Family Type of Home: House         Home Layout: One level Home Equipment: Conservation officer, nature (2 wheels);Cane - single point;Shower seat;Electric scooter Additional Comments: Pt reports they can not use power scooter in the home so they drive it to the door then walk into the bathroom area. Uses a lift chair during the day.    Prior Function Prior Level of Function : Needs assist       Physical Assist : Mobility (physical);ADLs (physical) Mobility (physical): Transfers ADLs (physical): Bathing;Dressing Mobility Comments: husband assists with transfers as needed but mostly from bed level ADLs Comments: Pt's husband completes LB ADSL        Extremity/Trunk Assessment   Upper Extremity Assessment Upper Extremity Assessment: Defer to OT evaluation    Lower Extremity Assessment Lower Extremity Assessment: RLE deficits/detail;LLE deficits/detail RLE Deficits / Details: ROM WFL, strength grossly 3+/5, neuropathy to knee, 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 RLE Sensation: history of peripheral neuropathy LLE Deficits / Details: ROM WFL, strength grossly 3+/5, neuropathy to knee, 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 LLE Sensation: history of peripheral neuropathy    Cervical / Trunk Assessment Cervical / Trunk Assessment: Kyphotic  Communication   Communication: No difficulties  Cognition Arousal/Alertness: Awake/alert Behavior During Therapy: WFL for tasks assessed/performed Overall Cognitive Status: Within Functional Limits for tasks assessed                                          General Comments General comments (skin integrity, edema, etc.): VSS on RA, slight dizziness with coming to seated BP in sitting 143/57        Assessment/Plan    PT Assessment Patient needs  continued PT services  PT Problem List Decreased strength;Decreased activity tolerance;Decreased balance;Decreased mobility;Impaired sensation;Pain;Decreased skin integrity       PT Treatment Interventions DME instruction;Gait training;Functional mobility training;Therapeutic activities;Therapeutic exercise;Balance training;Cognitive remediation;Patient/family education    PT Goals (Current goals can be found in the Care Plan section)  Acute Rehab PT Goals Patient Stated Goal: go home PT Goal Formulation: With patient/family Time For Goal Achievement: 01/01/22 Potential to Achieve Goals: Fair    Frequency Min 3X/week        AM-PAC PT "6 Clicks" Mobility  Outcome Measure Help needed turning from your back to your side while in a flat bed without using bedrails?: None Help needed moving from lying on your back to sitting on the side of a flat bed without using bedrails?: None Help needed moving to and from a bed to a chair (including a wheelchair)?: A Little Help needed standing up from a chair using your arms (e.g., wheelchair or bedside chair)?: A Little Help needed to walk in hospital room?: A Little Help needed climbing 3-5 steps with a railing? : A Lot 6 Click Score: 19    End of Session Equipment Utilized During Treatment: Gait belt Activity Tolerance: Patient tolerated treatment well Patient left: in bed;with call bell/phone within reach;with family/visitor present;with bed alarm set Nurse Communication: Mobility status PT Visit Diagnosis: Unsteadiness on feet (R26.81);Other abnormalities of gait and mobility (R26.89);Muscle weakness (generalized) (M62.81);History of falling (Z91.81);Difficulty in walking, not elsewhere classified (R26.2);Pain Pain -  part of body:  (headache)    Time: 5053-9767 PT Time Calculation (min) (ACUTE ONLY): 21 min   Charges:   PT Evaluation $PT Eval Moderate Complexity: 1 Mod          Carmelo Reidel B. Migdalia Dk PT, DPT Acute Rehabilitation  Services Please use secure chat or  Call Office [phone]   [place] 12/18/2021, 11:17 AM

## 2021-12-19 ENCOUNTER — Ambulatory Visit: Payer: 59 | Admitting: Cardiology

## 2021-12-19 DIAGNOSIS — I25119 Atherosclerotic heart disease of native coronary artery with unspecified angina pectoris: Secondary | ICD-10-CM | POA: Diagnosis not present

## 2021-12-19 DIAGNOSIS — N179 Acute kidney failure, unspecified: Secondary | ICD-10-CM | POA: Diagnosis not present

## 2021-12-19 DIAGNOSIS — N184 Chronic kidney disease, stage 4 (severe): Secondary | ICD-10-CM | POA: Diagnosis not present

## 2021-12-19 DIAGNOSIS — R112 Nausea with vomiting, unspecified: Secondary | ICD-10-CM | POA: Diagnosis not present

## 2021-12-19 LAB — GLUCOSE, CAPILLARY
Glucose-Capillary: 172 mg/dL — ABNORMAL HIGH (ref 70–99)
Glucose-Capillary: 162 mg/dL — ABNORMAL HIGH (ref 70–99)
Glucose-Capillary: 193 mg/dL — ABNORMAL HIGH (ref 70–99)
Glucose-Capillary: 210 mg/dL — ABNORMAL HIGH (ref 70–99)
Glucose-Capillary: 182 mg/dL — ABNORMAL HIGH (ref 70–99)
Glucose-Capillary: 157 mg/dL — ABNORMAL HIGH (ref 70–99)
Glucose-Capillary: 166 mg/dL — ABNORMAL HIGH (ref 70–99)

## 2021-12-19 LAB — CBC
HCT: 23.6 % — ABNORMAL LOW (ref 36.0–46.0)
Hemoglobin: 7.6 g/dL — ABNORMAL LOW (ref 12.0–15.0)
MCH: 30.5 pg (ref 26.0–34.0)
MCHC: 32.2 g/dL (ref 30.0–36.0)
MCV: 94.8 fL (ref 80.0–100.0)
Platelets: 197 10*3/uL (ref 150–400)
RBC: 2.49 MIL/uL — ABNORMAL LOW (ref 3.87–5.11)
RDW: 15.6 % — ABNORMAL HIGH (ref 11.5–15.5)
WBC: 10.7 10*3/uL — ABNORMAL HIGH (ref 4.0–10.5)
nRBC: 0 % (ref 0.0–0.2)

## 2021-12-19 LAB — BASIC METABOLIC PANEL
Anion gap: 13 (ref 5–15)
BUN: 81 mg/dL — ABNORMAL HIGH (ref 6–20)
CO2: 20 mmol/L — ABNORMAL LOW (ref 22–32)
Calcium: 7.2 mg/dL — ABNORMAL LOW (ref 8.9–10.3)
Chloride: 100 mmol/L (ref 98–111)
Creatinine, Ser: 6.84 mg/dL — ABNORMAL HIGH (ref 0.44–1.00)
GFR, Estimated: 7 mL/min — ABNORMAL LOW (ref >60)
Glucose, Bld: 179 mg/dL — ABNORMAL HIGH (ref 70–99)
Potassium: 4.9 mmol/L (ref 3.5–5.1)
Sodium: 133 mmol/L — ABNORMAL LOW (ref 135–145)

## 2021-12-19 MED ORDER — SALINE SPRAY 0.65 % NA SOLN: 1.0000 | NASAL | Status: DC | PRN

## 2021-12-19 MED ORDER — BUSPIRONE HCL 5 MG PO TABS
7.5000 mg | ORAL_TABLET | Freq: Two times a day (BID) | ORAL | Status: DC
  Administered 2021-12-19 – 2021-12-22 (×6): 7.5 mg via ORAL
  Filled 2021-12-19 (×6): qty 2

## 2021-12-19 MED ORDER — PROCHLORPERAZINE EDISYLATE 10 MG/2ML IJ SOLN
10.0000 mg | Freq: Four times a day (QID) | INTRAMUSCULAR | Status: AC | PRN
  Administered 2021-12-19 – 2021-12-22 (×3): 10 mg via INTRAVENOUS
  Filled 2021-12-19 (×3): qty 2

## 2021-12-19 MED ORDER — FUROSEMIDE 10 MG/ML IJ SOLN
80.0000 mg | Freq: Once | INTRAMUSCULAR | Status: AC
  Administered 2021-12-19: 80 mg via INTRAVENOUS
  Filled 2021-12-19: qty 8

## 2021-12-19 MED ORDER — METOPROLOL SUCCINATE ER 50 MG PO TB24
50.0000 mg | ORAL_TABLET | Freq: Every day | ORAL | Status: DC
  Administered 2021-12-19 – 2021-12-22 (×4): 50 mg via ORAL
  Filled 2021-12-19 (×4): qty 1

## 2021-12-19 MED ORDER — SODIUM BICARBONATE 650 MG PO TABS
650.0000 mg | ORAL_TABLET | Freq: Three times a day (TID) | ORAL | Status: DC
Start: 2021-12-19 — End: 2021-12-20
  Administered 2021-12-19 – 2021-12-20 (×3): 650 mg via ORAL
  Filled 2021-12-19 (×3): qty 1

## 2021-12-19 MED ORDER — DULOXETINE HCL 20 MG PO CPEP
20.0000 mg | ORAL_CAPSULE | Freq: Every day | ORAL | Status: DC
  Administered 2021-12-20 – 2021-12-22 (×3): 20 mg via ORAL
  Filled 2021-12-19 (×3): qty 1

## 2021-12-19 NOTE — Progress Notes (Signed)
Patient is bleeding from multiple spot in hand where her skin are fragile and get the friction,asked Dr.Rathore whether to give heparin SQ or not,told to hold for now and pass the report to day team. Heparin SQ holded ,report given to day shift nurse.

## 2021-12-19 NOTE — Progress Notes (Signed)
[facility] Progress Note  Name: Corby Villasenor MRN: [id number] DOB: [birth year]  Chief Complaint:  Hypoglycemia and weakness  Subjective:  strict ins/outs do not appear to be available.  She had 350 ml UOP over 9/5 charted and has had 400 mL.  She had in/out cath on 9/5 for retention and then got a foley placed.  She and I discussed the risks/benefits/indications for dialysis and she consents to when/if needed and understands will be tomorrow if no improvement in labs and symptoms.  Discussed plans for NPO after midnight tonight.   Review of systems:   Denies shortness of breath  She has had nausea for the past day and a half; no vomiting but emesis bag at bedside.  Denies chest pain  Has a headache ------------ Background on consult:  Abegail Kloeppel is a 56 y.o. female with a history including CKD, combined systolic and diastolic CHF, DM, bipolar disorder, HTN, and CAD who presented to [facility] with hypoglycemia at home.  She was noticing low sugar readings which were accompanied by n/v and weakness.  N/v has been going on for 3-4 days.  She has had trouble with low blood sugar for "a while" but never this low.  She has also had diarrhea recently.  She states that she has also had trouble urinating for about 2 weeks, having to strain with urination.  Per charting, EMS indicated BG was 20's on their arrival to her home.  She was transferred to [facility] with advanced CKD to facilitate access placement should HD be needed.  Her creatinine 5.90 on presentation and 6.16 today on repeat.  Nephrology is consulted for assistance with management of AKI.  She follows with Dr. Theador Hawthorne and was last seen 12/12/21.  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  She had three unmeasured urine voids over 9/3.  She does not currently have any dialysis access.  Has been compliant with home meds  including lasix.     Intake/Output Summary (Last 24 hours) at 12/19/2021 1243 Last data filed at 12/19/2021 1219 Gross per 24 hour  Intake 4400.15 ml  Output 750 ml  Net 3650.15 ml    Vitals:  Vitals:   12/18/21 2330 12/19/21 0400 12/19/21 0758 12/19/21 1137  BP: (!) 124/30  (!) 170/84 (!) 177/86  Pulse:   88 96  Resp:   11 16  Temp: 98.3 F (36.8 C) 97.9 F (36.6 C) 97.9 F (36.6 C) 98 F (36.7 C)  TempSrc: Oral Oral Oral Oral  SpO2:   97% 97%  Weight:      Height:         Physical Exam:    General: adult female in bed with nausea HEENT: NCAT  Eyes: EOMI sclera anicteric Neck: supple trachea midline  Heart: S1S2 no rub Lungs: clear and unlabored on room air  Abdomen: soft/nt/nd; obese habitus  Extremities: 2+ edema lower extremities Neuro: alert and oriented x 3 provides hx and follows commands Psych normal mood and affect GU foley catheter in place  Medications reviewed   Labs:     Latest Ref Rng & Units 12/19/2021    4:09 AM 12/18/2021    3:05 AM 12/17/2021    3:15 AM  BMP  Glucose 70 - 99 mg/dL 179  79  46   BUN 6 - 20 mg/dL 81  80  78   Creatinine 0.44 - 1.00  mg/dL 6.84  6.51  6.16   Sodium 135 - 145 mmol/L 133  132  133   Potassium 3.5 - 5.1 mmol/L 4.9  5.0  4.5   Chloride 98 - 111 mmol/L 100  104  111   CO2 22 - 32 mmol/L 20  16  13    Calcium 8.9 - 10.3 mg/dL 7.2  7.3  7.4      Assessment/Plan:   # AKI  - pre-renal insults and on home lasix and ARB.  Renal US no hydro with increased echogenicity.  UA with 100 mg/dL proteinuria and had up/cr ratio 2070 mg/g on up/cr ratio.  Urinary retention noted in the setting of worsening AKI - hopeful for plateau   - stop fluids.  Giving lasix 80 mg IV once to ensure no cardiorenal component  - would continue foley -  will make her NPO after midnight tonight for possible HD catheter on 9/7 - plan for HD on 9/7 if labs and/or symptoms do not improve - would need an alternative to buspirone and cymbalta as below  given her advanced CKD.  Discussed with primary team.  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 - baseline Cr is near 2.5 - 3 more recently - follows with Dr. Theador Hawthorne    # Metabolic acidosis  - setting of AKI as well as diarrhea - stop bicarb gtt  - transition to PO bicarbonate 650 mg TID   # Hyperkalemia  - improved with medical management - bicarb gtt    # Hypoglycemia - Setting of AKI; likely reduced requirement for her diabetes management  - appreciate primary team  - spoke with team - we are taking off bicarb gtt in dextrose - she is off of glimeperide    # Combined systolic and diastolic CHF  - caution with fluids - stop bicarb gtt     # HTN  - controlled; mild hypotension noted previously is improved - stop fluids  - lasix 80 mg IV once - she is off of her home meds.  will resume her home metoprolol succinate 50 mg daily   # Elevated troponin - Primary team consulted cardiology    # Normocytic anemia   - 12/06/21 outpatient labs with iron 55 and 19% sat - IV iron x 2 doses  - started aranesp 60 mcg weekly on Tuesdays    # CIPD - noted  - will need to reduce gabapentin to a max of 300 mg daily (see this is done) and would need to discuss an alternative to cymbalta with her provider (as even at her baseline renal function she would benefit from an alternate medication)   # Hyperphosphatemia  - transitioned to renal diet  - started renvela with meals  - renal panel in am  Disposition - please continue inpatient monitoring     Claudia Desanctis, MD 12/19/2021 1:13 PM

## 2021-12-19 NOTE — Progress Notes (Addendum)
PROGRESS NOTE    Tracy Walsh  AJO:[id number] DOB: 08-30-1965 DOA: 12/16/2021 PCP: [name], MD  Outpatient Specialists:     Brief Narrative:  Patient is a 56 year old Caucasian female with baseline chronic kidney disease stage IV, diabetes mellitus, hyperlipidemia, combined systolic and diastolic congestive heart failure with documented EF of 40 to 45%, coronary artery disease, CIDP, and neuropathy and chronic diarrhea.  According to the patient and patient's husband, patient has had diarrhea for over a year, with history of recurrent C. difficile infection.  Recent worsening of diarrhea was reported by the patient.  Whilst asleep, patient was noted to be sweating significantly and talking.  Patient's blood sugar was checked by the husband and found to be 20.  Patient was brought to the hospital for further assessment and management.  On presentation, patient was found to be in acute kidney injury on chronic kidney disease stage IV, mildly hyperkalemia, elevated troponin and severe metabolic acidosis.  Significant peripheral edema (edema of the lower extremities) noted.  Patient is currently on bicarb drip/D5.  Apparently, patient was on insulin prior to presentation.  Hyperkalemia has responded to medical treatment.  Nephrology input is appreciated.  Cardiology team is also being consulted.  12/17/2021: Patient seen alongside patient's husband.  No new complaints today. 12/18/2021: Patient seen alongside patient's husband.  Patient reports migraine headache.  Nephrology input is appreciated.   Assessment & Plan:   Principal Problem:   Acute renal failure superimposed on stage 4 chronic kidney disease, unspecified acute renal failure type ([place]) Active Problems:   Essential hypertension   Uncontrolled type 2 diabetes mellitus with hyperglycemia, without long-term current use of insulin (HCC)   CAD (coronary artery disease)   Chronic combined systolic and diastolic CHF (congestive heart  failure) (HCC)   CIDP (chronic inflammatory demyelinating polyneuropathy) (HCC)   Metabolic acidosis   Elevated troponin   Hyperkalemia   Nausea & vomiting   GERD (gastroesophageal reflux disease)   Depression with anxiety   HLD (hyperlipidemia)   Acute renal failure superimposed on stage 4 chronic kidney disease, unspecified acute renal failure type (HCC) -Prerenal insults and on home dose Lasix and ARB, renal ultrasound with no hydronephrosis, but with increased echogenicity, UA significant for proteinuria - Patient with stage IV renal failure at baseline -Significant worsening in her condition, creatinine peaked at 6.8 this morning  -Will avoid nephrotoxic agents, hypotension and contrast; patient's medications will be adjusted to current renal function -With acidosis, initially on bicarb drip, currently on oral bicarb -With nausea and vomiting, unclear if related to uremia, or other medications including buspirone or Cymbalta, will decrease these doses with a.m. to taper off eventually. 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   HLD (hyperlipidemia) -continue statin   Depression with anxiety -no SI or hallucinations -resume home antidepressant and anxiolytics meds.    GERD (gastroesophageal reflux disease) - Continue PPI. -dose adjusted to BID for better assistance relieving symptoms.    Nausea & vomiting -See above discussion,   Hyperkalemia -Management per renal   Elevated troponin - In the setting of demand ischemia versus worsening renal function -Patient with a heart score of 4-5 and significant risk factors. -Will continue to cycle troponin -Telemetry monitoring requested -Holding beta-blockers or any other agents in the setting of soft blood pressure and acute renal failure. -will resume aspirin and statin 12/17/2021: Cardiology team has been consulted.  No chest  pain  reported.   Metabolic acidosis - In the setting of worsening renal function -Patient has been started on sodium bicarbonate -Follow bicarb levels and renal function trend and stability. 12/17/2021: Likely secondary to worsening diarrhea and acute kidney injury.   CIDP (chronic inflammatory demyelinating polyneuropathy) (HCC) -continue cymbalta and adjusted dose of neuronitn.   Chronic combined systolic and diastolic CHF (congestive heart failure) (HCC) - Currently holding antihypertensives and diuretics agents in the setting of soft blood pressure and worsening renal function -Follow daily weights and strict intake and output -Most recent echo with ejection fraction 40-45% (March 2023). -Cardiology was consulted and expressed that they can follow patient when she arrived to come in consultation.   CAD (coronary artery disease) - Currently denied chest pain, but expressing shortness of breath -Patient with significant risk factors and elevated troponin on presentation -Case was briefly discussed with cardiologist on-call (Dr. Terrence Dupont) who felt patient troponin elevation most likely associated with worsening renal function. -Patient was initially started on heparin drip by EDP, second troponin has started to trend down -No acute ischemic changes on EKG. -Continue telemetry monitoring -Adequately continue to replete electrolytes -Follow daily weights, strict I's and O's and cardiology will see patient in consultation when she arrived to come. -Patient with recent echocardiogram in March 2023 demonstrating ejection fraction 40-45%; will hold on repeating echo at this moment and allow cardiology to determine if needed to further evaluate for any wall motion abnormalities. -continue ASA and statin   Uncontrolled type 2 diabetes mellitus with hyperglycemia, without long-term current use of insulin ([place]) - Presenting with severe hypoglycemia -At this moment holding oral hypoglycemic  agents -Patient experiencing ongoing nausea/vomiting and having difficulty taking by mouth. -Follow CBGs every 4 hours -Now CBG are elevated will DC IV dextrose.   Essential hypertension - Currently soft blood pressure -Patient with worsening renal function -Holding antihypertensive agents at this moment -Maintaining adequate hydration -Follow vital signs. 12/17/2021: Patient is currently mildly hypotensive.  Monitor closely.   Hyperphosphatemia -In the setting of worsening renal function -Continue to follow electrolytes trend -Follow nephrology recommendations.   Migraine headache: -Subcutaneous sumatriptan.   DVT prophylaxis: Subcutaneous heparin Code Status: Full code Family Communication: none at bedside Disposition Plan: Home eventually   Consultants:  Nephrology. Cardiology  Procedures:  None  Antimicrobials:  None   Subjective: She does report nausea and vomiting and poor oral intake  Objective: Vitals:   12/19/21 0400 12/19/21 0758 12/19/21 1137 12/19/21 1332  BP:  (!) 170/84 (!) 177/86 (!) 178/90  Pulse:  88 96 95  Resp:  11 16 12   Temp: 97.9 F (36.6 C) 97.9 F (36.6 C) 98 F (36.7 C)   TempSrc: Oral Oral Oral   SpO2:  97% 97% 98%  Weight:      Height:        Intake/Output Summary (Last 24 hours) at 12/19/2021 1401 Last data filed at 12/19/2021 1332 Gross per 24 hour  Intake 4400.15 ml  Output 1200 ml  Net 3200.15 ml   Filed Weights   12/16/21 1142  Weight: 90.7 kg    Examination:  Awake Alert, Oriented X 3, comfortable due to nausea and vomiting Symmetrical Chest wall movement, Good air movement bilaterally, CTAB RRR,No Gallops,Rubs or new Murmurs, No Parasternal Heave +ve B.Sounds, Abd Soft, No tenderness, No rebound - guarding or rigidity. No Cyanosis, Clubbing . ++ edema, No new Rash or bruise       Data Reviewed: I have personally reviewed following labs and imaging studies  CBC: Recent Labs  Lab 12/16/21 1131  12/17/21 0315 12/18/21 0305 12/19/21 0409  WBC 11.4* 8.5 10.9* 10.7*  NEUTROABS 9.9*  --   --   --   HGB 8.4* 7.7* 7.5* 7.6*  HCT 27.1* 24.0* 24.0* 23.6*  MCV 98.5 96.4 96.4 94.8  PLT 203 204 193 627   Basic Metabolic Panel: Recent Labs  Lab 12/16/21 1131 12/16/21 1513 12/17/21 0315 12/18/21 0305 12/19/21 0409  NA 131*  --  133* 132* 133*  K 5.3*  --  4.5 5.0 4.9  CL 111  --  111 104 100  CO2 11*  --  13* 16* 20*  GLUCOSE 51*  --  46* 79 179*  BUN 78*  --  78* 80* 81*  CREATININE 5.90*  --  6.16* 6.51* 6.84*  CALCIUM 7.8*  --  7.4* 7.3* 7.2*  MG  --  1.8  --   --   --   PHOS  --  8.7*  --  8.3*  --    GFR: Estimated Creatinine Clearance: 11.1 mL/min (A) (by C-G formula based on SCr of 6.84 mg/dL (H)). Liver Function Tests: Recent Labs  Lab 12/16/21 1131 12/17/21 0315 12/18/21 0305  AST 27 33  --   ALT 34 32  --   ALKPHOS 149* 129*  --   BILITOT 0.6 0.4  --   PROT 6.7 5.8*  --   ALBUMIN 3.0* 2.7* 2.5*   No results for input(s): "LIPASE", "AMYLASE" in the last 168 hours. No results for input(s): "AMMONIA" in the last 168 hours. Coagulation Profile: No results for input(s): "INR", "PROTIME" in the last 168 hours. Cardiac Enzymes: Recent Labs  Lab 12/16/21 1424  CKTOTAL 166   BNP (last 3 results) No results for input(s): "PROBNP" in the last 8760 hours. HbA1C: Recent Labs    12/16/21 1514  HGBA1C 6.3*   CBG: Recent Labs  Lab 12/18/21 2327 12/19/21 0235 12/19/21 0433 12/19/21 0800 12/19/21 1135  GLUCAP 201* 172* 162* 193* 210*   Lipid Profile: No results for input(s): "CHOL", "HDL", "LDLCALC", "TRIG", "CHOLHDL", "LDLDIRECT" in the last 72 hours. Thyroid Function Tests: No results for input(s): "TSH", "T4TOTAL", "FREET4", "T3FREE", "THYROIDAB" in the last 72 hours. Anemia Panel: No results for input(s): "VITAMINB12", "FOLATE", "FERRITIN", "TIBC", "IRON", "RETICCTPCT" in the last 72 hours. Urine analysis:    Component Value Date/Time    COLORURINE YELLOW 12/18/2021 0700   APPEARANCEUR HAZY (A) 12/18/2021 0700   LABSPEC 1.013 12/18/2021 0700   PHURINE 5.0 12/18/2021 0700   GLUCOSEU NEGATIVE 12/18/2021 0700   HGBUR NEGATIVE 12/18/2021 0700   BILIRUBINUR NEGATIVE 12/18/2021 0700   KETONESUR NEGATIVE 12/18/2021 0700   PROTEINUR 100 (A) 12/18/2021 0700   NITRITE NEGATIVE 12/18/2021 0700   LEUKOCYTESUR SMALL (A) 12/18/2021 0700   Sepsis Labs: @LABRCNTIP (procalcitonin:4,lacticidven:4)  ) Recent Results (from the past 240 hour(s))  Resp Panel by RT-PCR (Flu A&B, Covid) Anterior Nasal Swab     Status: None   Collection Time: 12/16/21  2:17 PM   Specimen: Anterior Nasal Swab  Result Value Ref Range Status   SARS Coronavirus 2 by RT PCR NEGATIVE NEGATIVE Final    Comment: (NOTE) SARS-CoV-2 target nucleic acids are NOT DETECTED.  The SARS-CoV-2 RNA is generally detectable in upper respiratory specimens during the acute phase of infection. The lowest concentration of SARS-CoV-2 viral copies this assay can detect is 138 copies/mL. A negative result does not preclude SARS-Cov-2 infection and should not be used as the sole basis for treatment  or other patient management decisions. A negative result may occur with  improper specimen collection/handling, submission of specimen other than nasopharyngeal swab, presence of viral mutation(s) within the areas targeted by this assay, and inadequate number of viral copies(<138 copies/mL). A negative result must be combined with clinical observations, patient history, and epidemiological information. The expected result is Negative.  Fact Sheet for Patients:  EntrepreneurPulse.com.au  Fact Sheet for Healthcare Providers:  IncredibleEmployment.be  This test is no t yet approved or cleared by the [place] FDA and  has been authorized for detection and/or diagnosis of SARS-CoV-2 by FDA under an Emergency Use Authorization (EUA). This EUA will  remain  in effect (meaning this test can be used) for the duration of the COVID-19 declaration under Section 564(b)(1) of the Act, 21 U.S.C.section 360bbb-3(b)(1), unless the authorization is terminated  or revoked sooner.       Influenza A by PCR NEGATIVE NEGATIVE Final   Influenza B by PCR NEGATIVE NEGATIVE Final    Comment: (NOTE) The Xpert Xpress SARS-CoV-2/FLU/RSV plus assay is intended as an aid in the diagnosis of influenza from Nasopharyngeal swab specimens and should not be used as a sole basis for treatment. Nasal washings and aspirates are unacceptable for Xpert Xpress SARS-CoV-2/FLU/RSV testing.  Fact Sheet for Patients: EntrepreneurPulse.com.au  Fact Sheet for Healthcare Providers: IncredibleEmployment.be  This test is not yet approved or cleared by the [place] FDA and has been authorized for detection and/or diagnosis of SARS-CoV-2 by FDA under an Emergency Use Authorization (EUA). This EUA will remain in effect (meaning this test can be used) for the duration of the COVID-19 declaration under Section 564(b)(1) of the Act, 21 U.S.C. section 360bbb-3(b)(1), unless the authorization is terminated or revoked.  Performed at [facility], [address] [place], [place] [address]          Radiology Studies: NM Pulmonary Perfusion  Result Date: 12/18/2021 CLINICAL DATA:  Positive D-dimer.  Pulmonary embolism suspected. EXAM: NUCLEAR MEDICINE PERFUSION LUNG SCAN TECHNIQUE: Perfusion images were obtained in multiple projections after intravenous injection of radiopharmaceutical. Ventilation scans intentionally deferred if perfusion scan and chest x-ray adequate for interpretation during COVID 19 epidemic. RADIOPHARMACEUTICALS:  4.2 mCi Tc-31m MAA IV COMPARISON:  None available; correlation is made with AP chest 12/18/2021 and 12/16/2021 FINDINGS: There is homogeneous distribution of the perfusion radiotracer throughout the  bilateral lungs. No segmental filling defect is seen. IMPRESSION: Normal modified perfusion only V/Q scan.  PE absent. Electronically Signed   By: Yvonne Kendall M.D.   On: 12/18/2021 14:04   DG CHEST PORT 1 VIEW  Result Date: 12/18/2021 CLINICAL DATA:  Asthma, CHF, COPD EXAM: PORTABLE CHEST 1 VIEW COMPARISON:  12/16/2021 FINDINGS: Mild cardiomegaly. Left chest port catheter. Both lungs are clear. The visualized skeletal structures are unremarkable. IMPRESSION: Mild cardiomegaly. No acute abnormality of the lungs. Electronically Signed   By: Delanna Ahmadi M.D.   On: 12/18/2021 10:51        Scheduled Meds:  ARIPiprazole  5 mg Oral Daily   aspirin EC  81 mg Oral Daily   atorvastatin  40 mg Oral QHS   busPIRone  7.5 mg Oral BID   Chlorhexidine Gluconate Cloth  6 each Topical Daily   darbepoetin (ARANESP) injection - NON-DIALYSIS  60 mcg Subcutaneous Q Tue-1800   [START ON 12/20/2021] DULoxetine  20 mg Oral Daily   gabapentin  300 mg Oral QHS   heparin injection (subcutaneous)  5,000 Units Subcutaneous Q8H   metoprolol succinate  50  mg Oral Daily   pantoprazole  40 mg Oral BID   sevelamer carbonate  800 mg Oral TID WC   sodium bicarbonate  650 mg Oral TID   sodium chloride flush  10-40 mL Intracatheter Q12H   Continuous Infusions:     LOS: 3 days      Phillips Climes MD, MD  [facility] 7PM-7AM contact night coverage as above

## 2021-12-19 NOTE — Progress Notes (Signed)
OT Cancellation Note  Patient Details Name: Tracy Walsh MRN: [id number] DOB: 09/07/1965   Cancelled Treatment:    Reason Eval/Treat Not Completed: Patient declined, no reason specified (Pt reports feeling nauseous and has a headache. Would like to hold off on therapy today and try tomorrow.)  Ailene Ravel, OTR/L,CBIS  Supplemental OT - [place] and [facility]  12/19/2021, 1:35 PM

## 2021-12-19 NOTE — Progress Notes (Signed)
Pt CBG was 220mg /dl.she had been drinking sugar drinks, since  she was running hypoglycemic.MD made aware,told to do 4hr CBG check and lab was ordered.

## 2021-12-20 ENCOUNTER — Encounter (HOSPITAL_COMMUNITY): Payer: Self-pay | Admitting: Internal Medicine

## 2021-12-20 ENCOUNTER — Inpatient Hospital Stay (HOSPITAL_COMMUNITY): Payer: 59

## 2021-12-20 ENCOUNTER — Inpatient Hospital Stay: Payer: 59

## 2021-12-20 DIAGNOSIS — I25119 Atherosclerotic heart disease of native coronary artery with unspecified angina pectoris: Secondary | ICD-10-CM | POA: Diagnosis not present

## 2021-12-20 DIAGNOSIS — N179 Acute kidney failure, unspecified: Secondary | ICD-10-CM | POA: Diagnosis not present

## 2021-12-20 DIAGNOSIS — N184 Chronic kidney disease, stage 4 (severe): Secondary | ICD-10-CM | POA: Diagnosis not present

## 2021-12-20 DIAGNOSIS — I5042 Chronic combined systolic (congestive) and diastolic (congestive) heart failure: Secondary | ICD-10-CM | POA: Diagnosis not present

## 2021-12-20 HISTORY — PX: IR US GUIDE VASC ACCESS RIGHT: IMG2390

## 2021-12-20 HISTORY — PX: IR FLUORO GUIDE CV LINE RIGHT: IMG2283

## 2021-12-20 LAB — HEPATITIS B SURFACE ANTIBODY,QUALITATIVE: Hep B S Ab: NONREACTIVE

## 2021-12-20 LAB — RENAL FUNCTION PANEL
Albumin: 2.4 g/dL — ABNORMAL LOW (ref 3.5–5.0)
Anion gap: 11 (ref 5–15)
BUN: 84 mg/dL — ABNORMAL HIGH (ref 6–20)
CO2: 20 mmol/L — ABNORMAL LOW (ref 22–32)
Calcium: 7.3 mg/dL — ABNORMAL LOW (ref 8.9–10.3)
Chloride: 99 mmol/L (ref 98–111)
Creatinine, Ser: 6.84 mg/dL — ABNORMAL HIGH (ref 0.44–1.00)
GFR, Estimated: 7 mL/min — ABNORMAL LOW (ref >60)
Glucose, Bld: 115 mg/dL — ABNORMAL HIGH (ref 70–99)
Phosphorus: 8.4 mg/dL — ABNORMAL HIGH (ref 2.5–4.6)
Potassium: 4.7 mmol/L (ref 3.5–5.1)
Sodium: 130 mmol/L — ABNORMAL LOW (ref 135–145)

## 2021-12-20 LAB — GLUCOSE, CAPILLARY
Glucose-Capillary: 132 mg/dL — ABNORMAL HIGH (ref 70–99)
Glucose-Capillary: 112 mg/dL — ABNORMAL HIGH (ref 70–99)
Glucose-Capillary: 94 mg/dL (ref 70–99)
Glucose-Capillary: 156 mg/dL — ABNORMAL HIGH (ref 70–99)
Glucose-Capillary: 174 mg/dL — ABNORMAL HIGH (ref 70–99)

## 2021-12-20 LAB — CBC
HCT: 22.5 % — ABNORMAL LOW (ref 36.0–46.0)
Hemoglobin: 7.4 g/dL — ABNORMAL LOW (ref 12.0–15.0)
MCH: 30.6 pg (ref 26.0–34.0)
MCHC: 32.9 g/dL (ref 30.0–36.0)
MCV: 93 fL (ref 80.0–100.0)
Platelets: 212 10*3/uL (ref 150–400)
RBC: 2.42 MIL/uL — ABNORMAL LOW (ref 3.87–5.11)
RDW: 15.7 % — ABNORMAL HIGH (ref 11.5–15.5)
WBC: 9.4 10*3/uL (ref 4.0–10.5)
nRBC: 0 % (ref 0.0–0.2)

## 2021-12-20 LAB — HEPATITIS C ANTIBODY: HCV Ab: NONREACTIVE

## 2021-12-20 LAB — HEPATITIS B SURFACE ANTIGEN: Hepatitis B Surface Ag: NONREACTIVE

## 2021-12-20 LAB — HEPATITIS B CORE ANTIBODY, TOTAL: Hep B Core Total Ab: NONREACTIVE

## 2021-12-20 MED ORDER — ANTICOAGULANT SODIUM CITRATE 4% (200MG/5ML) IV SOLN: 5.0000 mL | Status: DC | PRN

## 2021-12-20 MED ORDER — MIDAZOLAM HCL 2 MG/2ML IJ SOLN
INTRAMUSCULAR | Status: AC
  Filled 2021-12-20: qty 2

## 2021-12-20 MED ORDER — LIDOCAINE HCL 1 % IJ SOLN
INTRAMUSCULAR | Status: AC
  Administered 2021-12-20: 10 mL
  Filled 2021-12-20: qty 20

## 2021-12-20 MED ORDER — PENTAFLUOROPROP-TETRAFLUOROETH EX AERO: 1.0000 | INHALATION_SPRAY | CUTANEOUS | Status: DC | PRN

## 2021-12-20 MED ORDER — VANCOMYCIN HCL IN DEXTROSE 1-5 GM/200ML-% IV SOLN
1000.0000 mg | INTRAVENOUS | Status: AC
Start: 2021-12-20 — End: 2021-12-20
  Filled 2021-12-20: qty 200

## 2021-12-20 MED ORDER — CHLORHEXIDINE GLUCONATE CLOTH 2 % EX PADS
6.0000 | MEDICATED_PAD | Freq: Every day | CUTANEOUS | Status: DC
  Administered 2021-12-21 – 2021-12-22 (×2): 6 via TOPICAL

## 2021-12-20 MED ORDER — FUROSEMIDE 10 MG/ML IJ SOLN
80.0000 mg | Freq: Once | INTRAMUSCULAR | Status: AC
  Administered 2021-12-20: 80 mg via INTRAVENOUS
  Filled 2021-12-20: qty 8

## 2021-12-20 MED ORDER — VANCOMYCIN HCL IN DEXTROSE 1-5 GM/200ML-% IV SOLN
INTRAVENOUS | Status: AC
  Administered 2021-12-20: 1000 mg via INTRAVENOUS
  Filled 2021-12-20: qty 200

## 2021-12-20 MED ORDER — GELATIN ABSORBABLE 12-7 MM EX MISC
CUTANEOUS | Status: AC
  Administered 2021-12-20: 1
  Filled 2021-12-20: qty 1

## 2021-12-20 MED ORDER — LIDOCAINE-PRILOCAINE 2.5-2.5 % EX CREA: 1.0000 | TOPICAL_CREAM | CUTANEOUS | Status: DC | PRN

## 2021-12-20 MED ORDER — MIDAZOLAM HCL 2 MG/2ML IJ SOLN
INTRAMUSCULAR | Status: AC | PRN
  Administered 2021-12-20: 1 mg via INTRAVENOUS
  Administered 2021-12-20: .5 mg via INTRAVENOUS

## 2021-12-20 MED ORDER — LIDOCAINE HCL (PF) 1 % IJ SOLN: 5.0000 mL | INTRAMUSCULAR | Status: DC | PRN

## 2021-12-20 MED ORDER — HEPARIN SODIUM (PORCINE) 1000 UNIT/ML IJ SOLN
INTRAMUSCULAR | Status: AC
  Administered 2021-12-20: 3.2 mL
  Filled 2021-12-20: qty 10

## 2021-12-20 MED ORDER — FENTANYL CITRATE (PF) 100 MCG/2ML IJ SOLN
INTRAMUSCULAR | Status: AC | PRN
  Administered 2021-12-20 (×2): 25 ug via INTRAVENOUS

## 2021-12-20 MED ORDER — MAGNESIUM GLUCONATE 500 MG PO TABS
500.0000 mg | ORAL_TABLET | Freq: Every day | ORAL | Status: DC | PRN
  Administered 2021-12-20: 500 mg via ORAL
  Filled 2021-12-20 (×2): qty 1

## 2021-12-20 MED ORDER — FENTANYL CITRATE (PF) 100 MCG/2ML IJ SOLN
INTRAMUSCULAR | Status: AC
  Filled 2021-12-20: qty 2

## 2021-12-20 MED ORDER — ALTEPLASE 2 MG IJ SOLR: 2.0000 mg | Freq: Once | INTRAMUSCULAR | Status: DC | PRN

## 2021-12-20 MED ORDER — HEPARIN SODIUM (PORCINE) 1000 UNIT/ML DIALYSIS
1000.0000 [IU] | INTRAMUSCULAR | Status: DC | PRN
  Administered 2021-12-20: 2000 [IU]
  Filled 2021-12-20 (×3): qty 1

## 2021-12-20 NOTE — TOC Progression Note (Addendum)
Transition of Care [facility]) - Progression Note    Patient Details  Name: Elisandra Deshmukh MRN: [id number] Date of Birth: [birth year]  Transition [facility]) CM/SW Contact  Cyndi Bender, RN Phone Number: 12/20/2021, 1:34 PM  Clinical Narrative:    Patient agreeable to home health and defers to [facility] to find highly rated agency. Patient has used [facility] in the past.   Claiborne Billings with [facility] declined due to insurance. Jennifer with [facility] declined due to staffing. Cory with [facility] declined due to insurance.   Went to speak to patient about OP rehab options but patient in IR.  Will leave handoff for RNCM to follow up tomorrow.  Expected Discharge Plan: [place] Barriers to Discharge: Continued Medical Work up  Expected Discharge Plan and Services Expected Discharge Plan: [place]   Discharge Planning Services: CM Consult   Living arrangements for the past 2 months: Single Family Home                                       Social Determinants of Health (SDOH) Interventions    Readmission Risk Interventions    08/30/2021    1:38 PM  Readmission Risk Prevention Plan  Transportation Screening Complete  Home Care Screening Complete  Medication Review (RN CM) Complete

## 2021-12-20 NOTE — Sedation Documentation (Signed)
Report given to dialysis nurse.

## 2021-12-20 NOTE — Procedures (Addendum)
Interventional Radiology Procedure Note  Procedure: Placement of a right IJ approach tunneled HD catheter.  19cm tip to cuff.   Tip is positioned at the superior cavoatrial junction and catheter is ready for immediate use.   Complications: None  Recommendations:  - Ok to use - Do not submerge - Routine line care  - OK to advance diet per primary order - OK to resume AC as needed  Signed,  Dulcy Fanny. Earleen Newport, DO

## 2021-12-20 NOTE — Progress Notes (Signed)
PT Cancellation Note  Patient Details Name: Tracy Walsh MRN: [id number] DOB: 12-12-1965   Cancelled Treatment:    Reason Eval/Treat Not Completed: (P) Patient at procedure or test/unavailable (pt off unit at IR for catheter placement) Will continue efforts per PT plan of care as schedule permits   Carlene Coria 12/20/2021, 6:40 PM

## 2021-12-20 NOTE — H&P (Signed)
Chief Complaint: Patient was seen in consultation today for AKI on CKD stage IV  at the request of Harrie Jeans  Referring Physician(s): Harrie Jeans  Supervising Physician: Corrie Mckusick  Patient Status: [facility] - In-pt  History of Present Illness: Tracy Walsh is a 56 y.o. female with PMH of CKD presented to [facility] ED c/o hypoglycemia, N/V, diarrhea and difficulty urinating. Pt was subsequently transferred to [facility] d/t advanced CKD for HD with creatinine of 5.90, now 6.78. Pt had renal biopsy 12/01/21 that demonstrated 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 Nephrology was consulted for management of AKI. Pt has been referred to IR by Harrie Jeans, MD, for tunneled dialysis catheter placement to start HD.   Past Medical History:  Diagnosis Date   Anemia    Anxiety    Asthma    Bipolar disorder ([place]) 2018   type 2   CHF (congestive heart failure) (HCC)    CIDP (chronic inflammatory demyelinating polyneuropathy) (HCC)    CKD (chronic kidney disease)    COPD (chronic obstructive pulmonary disease) (HCC)    Depression    Diabetes mellitus without complication (HCC)    type 2   Diabetic foot ulcers (HCC)    Fibromyalgia    GERD (gastroesophageal reflux disease)    Hepatitis    Hep as a child - patient can give blood   History of blood transfusion 08/2021   1 unit per patient   History of esophagogastroduodenoscopy (EGD) 2018   History of kidney stones    passed stones   HLD (hyperlipidemia)    Hypertension    Migraines    Myocardial infarction (HCC)    x 2   Neuropathy    legs and feet - bilateral    Past Surgical History:  Procedure Laterality Date   ABDOMINAL HYSTERECTOMY     APPENDECTOMY     BILATERAL NASAL FRACTURE CLOSED REDUCTION  10/2020   CHOLECYSTECTOMY     COLONOSCOPY     CORONARY ANGIOPLASTY WITH STENT PLACEMENT  01/15/2021   FOOT SURGERY Right    I & D   I & D EXTREMITY Left 09/28/2021   Procedure: LEFT HEEL DEBRIDEMENT AND  TISSUE GRAFT;  Surgeon: Newt Minion, MD;  Location: [place];  Service: Orthopedics;  Laterality: Left;   SHOULDER SURGERY Bilateral    SPINAL FUSION     UPPER GI ENDOSCOPY      Allergies: Amoxicillin, Clarithromycin, Codeine, Hydrocodone, Moxifloxacin, Penicillins, Sulfa antibiotics, Nitrofurantoin, Crab (diagnostic), and Latex  Medications: Prior to Admission medications   Medication Sig Start Date End Date Taking? Authorizing Provider  albuterol (PROVENTIL) (2.5 MG/3ML) 0.083% nebulizer solution Take 2.5 mg by nebulization every 6 (six) hours as needed for wheezing or shortness of breath. 05/08/21  Yes [provider]  amitriptyline (ELAVIL) 10 MG tablet Take 1 tablet (10 mg total) by mouth 2 (two) times daily. 12/07/21 03/07/22 Yes [name], MD  ARIPiprazole (ABILIFY) 5 MG tablet Take 5 mg by mouth daily. 12/14/19  Yes [provider]  Ascorbic Acid (VITAMIN C) 1000 MG tablet Take 1,000 mg by mouth daily.   Yes [provider]  aspirin EC 81 MG tablet Take 81 mg by mouth daily. Swallow whole.   Yes [provider]  atorvastatin (LIPITOR) 40 MG tablet Take 40 mg by mouth at bedtime. 08/03/21  Yes [provider]  busPIRone (BUSPAR) 15 MG tablet Take 1 tablet (15 mg total) by mouth 2 (two) times daily. 12/07/21  03/07/22 Yes [name], MD  calcitRIOL (ROCALTROL) 0.25 MCG capsule Take 0.25 mcg by mouth every Monday, Wednesday, and Friday. 09/12/21  Yes [provider]  Cholecalciferol 25 MCG (1000 UT) tablet Take 1,000 Units by mouth daily. 05/01/21  Yes [provider]  cyanocobalamin (,VITAMIN B-12,) 1000 MCG/ML injection Inject 1,000 mcg into the muscle every 30 (thirty) days. 09/17/21  Yes [provider]  doxazosin (CARDURA) 4 MG tablet Take 4 mg by mouth daily. 08/20/21  Yes [provider]  DULoxetine (CYMBALTA) 60 MG capsule Take 1 capsule (60 mg total) by mouth daily. 12/07/21 03/07/22 Yes [name], MD  furosemide (LASIX) 80 MG tablet Take 80 mg by mouth daily. 08/09/21 08/22/22 Yes [provider]  gabapentin (NEURONTIN) 100 MG capsule Take 2 capsules (200 mg total) by mouth 2 (two) times daily. 12/07/21 03/07/22 Yes [name], MD  glimepiride (AMARYL) 2 MG tablet Take 1 tablet (2 mg total) by mouth daily before breakfast. 12/07/21 03/07/22 Yes [name], MD  hydrALAZINE (APRESOLINE) 50 MG tablet Take 50 mg by mouth 2 (two) times daily. 08/08/21  Yes [provider]  isosorbide mononitrate (IMDUR) 30 MG 24 hr tablet Take 30 mg by mouth every morning. 05/04/21  Yes [provider]  metoprolol succinate (TOPROL-XL) 50 MG 24 hr tablet Take 1 tablet (50 mg total) by mouth daily. 12/07/21 03/07/22 Yes [name], MD  Multiple Vitamins-Minerals (HAIR/SKIN/NAILS/BIOTIN PO) Take 2 each by mouth daily.   Yes [provider]  Multiple Vitamins-Minerals (MULTIVITAMIN WITH MINERALS) tablet Take 1 tablet by mouth daily.   Yes [provider]  mupirocin ointment (BACTROBAN) 2 % Apply 1 Application topically daily. 10/31/21  Yes Suzan Slick, NP  omeprazole (PRILOSEC) 20 MG capsule Take 20 mg by mouth daily.   Yes [provider]  Probiotic Product (PROBIOTIC PO) Take 1 capsule by mouth daily.   Yes [provider]  telmisartan (MICARDIS) 20 MG tablet Take 20 mg by mouth daily. 10/10/21  Yes [provider]  zolpidem (AMBIEN) 5 MG tablet Take 1 tablet (5 mg total) by mouth at bedtime. 12/07/21 01/06/22 Yes [name], MD  ferrous sulfate 325 (65 FE) MG EC tablet Take 325 mg by mouth 2 (two) times daily. Patient not taking: Reported on 12/16/2021 02/19/21   [provider]     No family history on file.  Social History   Socioeconomic History   Marital status: Married    Spouse name: Not on file   Number of children: Not on file   Years of education: Not on file   Highest education level: Not on  file  Occupational History   Not on file  Tobacco Use   Smoking status: Every Day    Packs/day: 0.50    Types: Cigarettes   Smokeless tobacco: Never  Vaping Use   Vaping Use: Never used  Substance and Sexual Activity   Alcohol use: Never   Drug use: Never   Sexual activity: Yes    Birth control/protection: Surgical    Comment: Hysterectomy  Other Topics Concern   Not on file  Social History Narrative   Not on file   Social Determinants of Health   Financial Resource Strain: Not on file  Food Insecurity: Not on file  Transportation Needs: Not on file  Physical Activity: Not on file  Stress: Not on file  Social Connections: Not on file    Review of Systems: A  12 point ROS discussed and pertinent positives are indicated in the HPI above.  All other systems are negative.  Review of Systems  Constitutional:  Negative for chills, fatigue and fever.  Respiratory:  Negative for shortness of breath.   Cardiovascular:  Positive for leg swelling. Negative for chest pain.  Gastrointestinal:  Positive for nausea.  Neurological:  Negative for dizziness, weakness and headaches.    Vital Signs: BP 135/66   Pulse 60   Temp 98.1 F (36.7 C) (Oral)   Resp 11   Ht 5\' 8"  (1.727 m)   Wt 200 lb (90.7 kg)   SpO2 97%   BMI 30.41 kg/m     Physical Exam Constitutional:      General: She is not in acute distress.    Appearance: Normal appearance. She is not ill-appearing.  HENT:     Head: Normocephalic and atraumatic.     Mouth/Throat:     Mouth: Mucous membranes are dry.     Pharynx: Oropharynx is clear.  Eyes:     Extraocular Movements: Extraocular movements intact.     Pupils: Pupils are equal, round, and reactive to light.  Cardiovascular:     Rate and Rhythm: Normal rate and regular rhythm.     Pulses: Normal pulses.     Heart sounds: Normal heart sounds. No murmur heard. Pulmonary:     Effort: Pulmonary effort is normal.     Breath sounds: Wheezing present.      Comments: BL expiratory wheezing Abdominal:     General: Bowel sounds are normal. There is no distension.     Palpations: Abdomen is soft.     Tenderness: There is no abdominal tenderness. There is no guarding.  Musculoskeletal:     Right lower leg: Edema present.     Left lower leg: Edema present.  Skin:    General: Skin is warm and dry.     Comments: Left upper chest port in place/accessed  Neurological:     Mental Status: She is alert and oriented to person, place, and time.  Psychiatric:        Mood and Affect: Mood normal.        Behavior: Behavior normal.        Thought Content: Thought content normal.        Judgment: Judgment normal.     Imaging: NM Pulmonary Perfusion  Result Date: 12/18/2021 CLINICAL DATA:  Positive D-dimer.  Pulmonary embolism suspected. EXAM: NUCLEAR MEDICINE PERFUSION LUNG SCAN TECHNIQUE: Perfusion images were obtained in multiple projections after intravenous injection of radiopharmaceutical. Ventilation scans intentionally deferred if perfusion scan and chest x-ray adequate for interpretation during COVID 19 epidemic. RADIOPHARMACEUTICALS:  4.2 mCi Tc-43m MAA IV COMPARISON:  None available; correlation is made with [facility] chest 12/18/2021 and 12/16/2021 FINDINGS: There is homogeneous distribution of the perfusion radiotracer throughout the bilateral lungs. No segmental filling defect is seen. IMPRESSION: Normal modified perfusion only V/Q scan.  PE absent. Electronically Signed   By: Yvonne Kendall M.D.   On: 12/18/2021 14:04   DG CHEST PORT 1 VIEW  Result Date: 12/18/2021 CLINICAL DATA:  Asthma, CHF, COPD EXAM: PORTABLE CHEST 1 VIEW COMPARISON:  12/16/2021 FINDINGS: Mild cardiomegaly. Left chest port catheter. Both lungs are clear. The visualized skeletal structures are unremarkable. IMPRESSION: Mild cardiomegaly. No acute abnormality of the lungs. Electronically Signed   By: Delanna Ahmadi M.D.   On: 12/18/2021 10:51   US RENAL  Result Date:  12/16/2021 CLINICAL DATA:  Acute renal failure  on stage 4 chronic kidney disease. EXAM: RENAL / URINARY TRACT ULTRASOUND COMPLETE COMPARISON:  CT 08/29/2021 FINDINGS: Right Kidney: Renal measurements: 12.9 x 4 x 4.6 cm = volume: 126 mL. Normal parenchymal echogenicity. No hydronephrosis. No visualized stone or focal lesion. Left Kidney: Renal measurements: 12.8 x 6.6 x 5.5 cm = volume: 241 mL. Mildly increased parenchymal echogenicity. No hydronephrosis. No visualized stone or focal lesion. Bladder: Appears normal for degree of bladder distention. Other: None. IMPRESSION: 1. Mild increased left renal parenchymal echogenicity typical of chronic medical renal disease. 2. Otherwise unremarkable sonographic appearance of the kidneys and bladder. Electronically Signed   By: Keith Rake M.D.   On: 12/16/2021 21:39   DG Chest Port 1 View  Result Date: 12/16/2021 CLINICAL DATA:  Questionable sepsis. EXAM: PORTABLE CHEST 1 VIEW COMPARISON:  08/29/2021. FINDINGS: Cardiac silhouette is normal in size. Normal mediastinal and hilar contours. Stable left anterior chest wall Port-A-Cath. Clear lungs.  No pleural effusion or pneumothorax. Skeletal structures are grossly intact. IMPRESSION: No active disease. Electronically Signed   By: Lajean Manes M.D.   On: 12/16/2021 11:08    Labs:  CBC: Recent Labs    12/17/21 0315 12/18/21 0305 12/19/21 0409 12/20/21 0500  WBC 8.5 10.9* 10.7* 9.4  HGB 7.7* 7.5* 7.6* 7.4*  HCT 24.0* 24.0* 23.6* 22.5*  PLT 204 193 197 212    COAGS: Recent Labs    11/20/21 0630  INR 1.2    BMP: Recent Labs    12/17/21 0315 12/18/21 0305 12/19/21 0409 12/20/21 0500  NA 133* 132* 133* 130*  K 4.5 5.0 4.9 4.7  CL 111 104 100 99  CO2 13* 16* 20* 20*  GLUCOSE 46* 79 179* 115*  BUN 78* 80* 81* 84*  CALCIUM 7.4* 7.3* 7.2* 7.3*  CREATININE 6.16* 6.51* 6.84* 6.84*  GFRNONAA 8* 7* 7* 7*    LIVER FUNCTION TESTS: Recent Labs    08/29/21 1226 12/16/21 1131 12/17/21 0315  12/18/21 0305 12/20/21 0500  BILITOT 0.5 0.6 0.4  --   --   AST 16 27 33  --   --   ALT 16 34 32  --   --   ALKPHOS 107 149* 129*  --   --   PROT 8.0 6.7 5.8*  --   --   ALBUMIN 3.1* 3.0* 2.7* 2.5* 2.4*    TUMOR MARKERS: No results for input(s): "AFPTM", "CEA", "CA199", "CHROMGRNA" in the last 8760 hours.  Assessment and Plan: 56 yo female presents to IR for tunneled dialysis cathter placement to start HD for AKI on CKD stage IV.  Pt resting in bed with husband at bedside. She is A&O, calm and pleasant.  She is in no distress.  Pt states she has been NPO.  Risks and benefits discussed with the patient including, but not limited to bleeding, infection, vascular injury, pneumothorax which may require chest tube placement, air embolism or even death  All of the patient's questions were answered, patient is agreeable to proceed. Consent signed and in chart.   Thank you for this interesting consult.  I greatly enjoyed meeting Tracy Walsh and look forward to participating in their care.  A copy of this report was sent to the requesting provider on this date.  Electronically Signed: Tyson Alias, NP 12/20/2021, 11:56 AM   I spent a total of 20 minutes  in face to face in clinical consultation, greater than 50% of which was counseling/coordinating care for AKI.

## 2021-12-20 NOTE — Progress Notes (Signed)
Received patient in bed to unit.  Alert and oriented.  Informed consent signed and in chart.   Treatment initiated: 1523 Treatment completed: 1723  Patient tolerated well.  Transported back to the room  Alert, without acute distress.  Hand-off given to patient's nurse.   Access used: rt hd cath Access issues: none  Total UF removed: 0 Medication(s) given: none Post HD VS: 98.1,160/79, 71, 12  Post HD weight: 100.8kg   [facility]

## 2021-12-20 NOTE — Progress Notes (Signed)
Requested to see pt for out-pt HD needs at d/c. Met with pt and pt's husband at bedside. Introduced self and explained role. Pt lives in [place] and sees Dr Bhutani as out-pt. Pt prefers [facility] [place] for this reason. Referral submitted to [facility] admissions for review/placement. Will fax Hep B labs once they result. Pt's husband plans to transport pt to HD appts. Will assist as needed.     Renal Navigator [phone] 

## 2021-12-20 NOTE — Progress Notes (Signed)
[facility] Progress Note  Name: Tracy Walsh MRN: [id number] DOB: [birth year]  Chief Complaint:  Hypoglycemia and weakness  Subjective:  She had 1.3 liters UOP over 9/6.  She and I discussed dialysis again and she does want to go ahead and start dialysis today.    Review of systems:    Denies shortness of breath  She has had nausea for the past two days; not able to eat yesterday. Nausea a little better today but doesn't feel great Denies chest pain  Has a headache - maybe a little better ------------ Background on consult:  Tracy Walsh is a 56 y.o. female with a history including CKD, combined systolic and diastolic CHF, DM, bipolar disorder, HTN, and CAD who presented to [facility] with hypoglycemia at home.  She was noticing low sugar readings which were accompanied by n/v and weakness.  N/v has been going on for 3-4 days.  She has had trouble with low blood sugar for "a while" but never this low.  She has also had diarrhea recently.  She states that she has also had trouble urinating for about 2 weeks, having to strain with urination.  Per charting, EMS indicated BG was 20's on their arrival to her home.  She was transferred to [facility] with advanced CKD to facilitate access placement should HD be needed.  Her creatinine 5.90 on presentation and 6.16 today on repeat.  Nephrology is consulted for assistance with management of AKI.  She follows with Dr. Theador Hawthorne and was last seen 12/12/21.  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  She had three unmeasured urine voids over 9/3.  She does not currently have any dialysis access.  Has been compliant with home meds including lasix.     Intake/Output Summary (Last 24 hours) at 12/20/2021 1051 Last data filed at 12/19/2021 1608 Gross per 24 hour  Intake 120 ml  Output 900 ml  Net -780 ml    Vitals:  Vitals:    12/19/21 2011 12/20/21 0000 12/20/21 0445 12/20/21 0813  BP: (!) 159/81 124/62 (!) 111/91 135/66  Pulse: 75 60 60 60  Resp: 20 14 16 11   Temp: 97.9 F (36.6 C) 98.1 F (36.7 C) 98 F (36.7 C) 98.1 F (36.7 C)  TempSrc: Oral Oral Oral Oral  SpO2: 98% 94% 97% 97%  Weight:      Height:         Physical Exam:     General: adult female in bed with nausea HEENT: NCAT  Eyes: EOMI sclera anicteric Neck: supple trachea midline  Heart: S1S2 no rub Lungs: clear and unlabored on room air  Abdomen: soft/nt/nd; obese habitus  Extremities: 1-2+ edema lower extremities Neuro: alert and oriented x 3 provides hx and follows commands Psych normal mood and affect GU foley catheter in place  Medications reviewed   Labs:     Latest Ref Rng & Units 12/20/2021    5:00 AM 12/19/2021    4:09 AM 12/18/2021    3:05 AM  BMP  Glucose 70 - 99 mg/dL 115  179  79   BUN 6 - 20 mg/dL 84  81  80   Creatinine 0.44 - 1.00 mg/dL 6.84  6.84  6.51   Sodium 135 - 145 mmol/L 130  133  132   Potassium 3.5 - 5.1 mmol/L 4.7  4.9  5.0   Chloride 98 - 111  mmol/L 99  100  104   CO2 22 - 32 mmol/L 20  20  16    Calcium 8.9 - 10.3 mg/dL 7.3  7.2  7.3      Assessment/Plan:   # AKI  - pre-renal insults and on home lasix and ARB.  Renal US no hydro with increased echogenicity.  UA with 100 mg/dL proteinuria and had up/cr ratio 2070 mg/g on up/cr ratio.  Urinary retention noted in the setting of worsening AKI - Giving lasix 80 mg IV once   - would continue foley - Will consult IR for tunneled catheter for hemodialysis   - plan for HD today and tomorrow after access placement   - would need an alternative to buspirone and cymbalta as below given her advanced CKD.  Discussed with primary team.  Would at least taper agents and would plan to transition  - spoke with HD SW to initiate CLIP process for outpatient HD (as an AKI).  Given her stage IV CKD I anticipate a longer dialysis dependence.  She may even unfortunately have  progressed to ESRD however will need to watch as AKI to see if any recovery.   # CKD stage IV  - renal biopsy 12/01/21 and per nephrology note the biopsy demonstrated 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 - baseline Cr is near 2.5 - 3 more recently - follows with Dr. Theador Hawthorne    # Metabolic acidosis  - setting of AKI as well as diarrhea - stop bicarb PO as starting HD     # Hyperkalemia  - improved with medical management - bicarb gtt    # Hypoglycemia - Setting of AKI; likely reduced requirement for her diabetes management  - appreciate primary team  - she is off of glimeperide    # Combined systolic and diastolic CHF  - off fluids and s/p lasix as above     # HTN  - improved and back on home beta blocker   # Elevated troponin - Primary team consulted cardiology    # Normocytic anemia   - 12/06/21 outpatient labs with iron 55 and 19% sat - IV iron x 2 doses  - started aranesp 60 mcg weekly on Tuesdays    # CIPD - noted  - will need to reduce gabapentin to a max of 300 mg daily (see this is done) and would need to discuss an alternative to cymbalta with her provider (as even at her baseline renal function she would benefit from an alternate medication)   # Hyperphosphatemia  - transitioned to renal diet  - started renvela with meals  - renal panel in am  Disposition - please continue inpatient monitoring     Claudia Desanctis, MD 12/20/2021 11:36 AM

## 2021-12-20 NOTE — Progress Notes (Signed)
PROGRESS NOTE    Tracy Walsh  MCN:[id number] DOB: August 04, 1965 DOA: 12/16/2021 PCP: [name], MD  Outpatient Specialists:     Brief Narrative:  Patient is a 56 year old Caucasian female with baseline chronic kidney disease stage IV, diabetes mellitus, hyperlipidemia, combined systolic and diastolic congestive heart failure with documented EF of 40 to 45%, coronary artery disease, CIDP, and neuropathy and chronic diarrhea.  According to the patient and patient's husband, patient has had diarrhea for over a year, with history of recurrent C. difficile infection.  Recent worsening of diarrhea was reported by the patient.  Whilst asleep, patient was noted to be sweating significantly and talking.  Patient's blood sugar was checked by the husband and found to be 20.  Patient was brought to the hospital for further assessment and management.  On presentation, patient was found to be in acute kidney injury on chronic kidney disease stage IV, mildly hyperkalemia, elevated troponin and severe metabolic acidosis.  Significant peripheral edema (edema of the lower extremities) noted.  Patient is currently on bicarb drip/D5.  Apparently, patient was on insulin prior to presentation.  Hyperkalemia has responded to medical treatment.  Nephrology input is appreciated.  Cardiology team is also being consulted.  12/17/2021: Patient seen alongside patient's husband.  No new complaints today. 12/18/2021: Patient seen alongside patient's husband.  Patient reports migraine headache.  Nephrology input is appreciated.   Assessment & Plan:   Principal Problem:   Acute renal failure superimposed on stage 4 chronic kidney disease, unspecified acute renal failure type (HCC) Active Problems:   Essential hypertension   Uncontrolled type 2 diabetes mellitus with hyperglycemia, without long-term current use of insulin (HCC)   CAD (coronary artery disease)   Chronic combined systolic and diastolic CHF (congestive heart  failure) (HCC)   CIDP (chronic inflammatory demyelinating polyneuropathy) (HCC)   Metabolic acidosis   Elevated troponin   Hyperkalemia   Nausea & vomiting   GERD (gastroesophageal reflux disease)   Depression with anxiety   HLD (hyperlipidemia)   Acute renal failure superimposed on stage 4 chronic kidney disease, unspecified acute renal failure type (HCC) -Prerenal insults and on home dose Lasix and ARB, renal ultrasound with no hydronephrosis, but with increased echogenicity, UA significant for proteinuria - Patient with stage IV renal failure at baseline -Will avoid nephrotoxic agents, hypotension and contrast; patient's medications will be adjusted to current renal function -With acidosis, initially on bicarb drip, currently on oral bicarb, bicarb remains stable at 20 -Input greatly appreciated, her nausea and vomiting likely related to uremia, plan to start HD today, IR consulted for [facility] placement  - renal biopsy 12/01/21 and per nephrology note the biopsy demonstrated 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   HLD (hyperlipidemia) -continue statin   Depression with anxiety -no SI or hallucinations -resume home antidepressant and anxiolytics meds.    GERD (gastroesophageal reflux disease) - Continue PPI. -dose adjusted to BID for better assistance relieving symptoms.    Nausea & vomiting -See above discussion,   Hyperkalemia -Management per renal   Elevated troponin - In the setting of demand ischemia versus worsening renal function -Patient with a heart score of 4-5 and significant risk factors. -Will continue to cycle troponin -Telemetry monitoring requested -Holding beta-blockers or any other agents in the setting of soft blood pressure and acute renal failure. -will resume aspirin and statin    Metabolic acidosis - In the setting of worsening renal function -Improved with sodium bicarb  CIDP (chronic inflammatory demyelinating  polyneuropathy) ([place]) -continue cymbalta and adjusted dose of neuronitn.  Recent Cymbalta dose due to worsening renal failure   Chronic combined systolic and diastolic CHF (congestive heart failure) (HCC) - Currently holding antihypertensives and diuretics agents in the setting of soft blood pressure and worsening renal function -Follow daily weights and strict intake and output -Most recent echo with ejection fraction 40-45% (March 2023). -Cardiology was consulted and expressed that they can follow patient when she arrived to come in consultation.   CAD (coronary artery disease) - Currently denied chest pain, but expressing shortness of breath -Patient with significant risk factors and elevated troponin on presentation -Case was briefly discussed with cardiologist on-call (Dr. Terrence Dupont) who felt patient troponin elevation most likely associated with worsening renal function. -Patient was initially started on heparin drip by EDP, second troponin has started to trend down -No acute ischemic changes on EKG. -continue ASA and statin   Uncontrolled type 2 diabetes mellitus with hyperglycemia, without long-term current use of insulin ([place]) - Presenting with severe hypoglycemia -At this moment holding oral hypoglycemic agents -Patient experiencing ongoing nausea/vomiting and having difficulty taking by mouth. -Follow CBGs every 4 hours -Now CBG are elevated will DC IV dextrose.   Essential hypertension - Currently soft blood pressure   Hyperphosphatemia -In the setting of worsening renal function -Continue to follow electrolytes trend -Follow nephrology recommendations.   Migraine headache: -Subcutaneous sumatriptan.   DVT prophylaxis: Subcutaneous heparin Code Status: Full code Family Communication: none at bedside Disposition Plan: Home eventually   Consultants:  Nephrology. Cardiology  Procedures:  None  Antimicrobials:  None   Subjective: She still reports nausea,  but no vomiting yesterday, she reports weakness.  Objective: Vitals:   12/19/21 2011 12/20/21 0000 12/20/21 0445 12/20/21 0813  BP: (!) 159/81 124/62 (!) 111/91 135/66  Pulse: 75 60 60 60  Resp: 20 14 16 11   Temp: 97.9 F (36.6 C) 98.1 F (36.7 C) 98 F (36.7 C) 98.1 F (36.7 C)  TempSrc: Oral Oral Oral Oral  SpO2: 98% 94% 97% 97%  Weight:      Height:        Intake/Output Summary (Last 24 hours) at 12/20/2021 1152 Last data filed at 12/19/2021 1608 Gross per 24 hour  Intake 120 ml  Output 900 ml  Net -780 ml   Filed Weights   12/16/21 1142  Weight: 90.7 kg    Examination:  Awake Alert, Oriented X 3,  Symmetrical Chest wall movement, Good air movement bilaterally, CTAB RRR,No Gallops,Rubs or new Murmurs, No Parasternal Heave +ve B.Sounds, Abd Soft, No tenderness, No rebound - guarding or rigidity. No Cyanosis, Clubbing ,++ edema, No new Rash or bruise       Data Reviewed: I have personally reviewed following labs and imaging studies  CBC: Recent Labs  Lab 12/16/21 1131 12/17/21 0315 12/18/21 0305 12/19/21 0409 12/20/21 0500  WBC 11.4* 8.5 10.9* 10.7* 9.4  NEUTROABS 9.9*  --   --   --   --   HGB 8.4* 7.7* 7.5* 7.6* 7.4*  HCT 27.1* 24.0* 24.0* 23.6* 22.5*  MCV 98.5 96.4 96.4 94.8 93.0  PLT 203 204 193 197 892   Basic Metabolic Panel: Recent Labs  Lab 12/16/21 1131 12/16/21 1513 12/17/21 0315 12/18/21 0305 12/19/21 0409 12/20/21 0500  NA 131*  --  133* 132* 133* 130*  K 5.3*  --  4.5 5.0 4.9 4.7  CL 111  --  111 104 100 99  CO2 11*  --  13* 16* 20*  20*  GLUCOSE 51*  --  46* 79 179* 115*  BUN 78*  --  78* 80* 81* 84*  CREATININE 5.90*  --  6.16* 6.51* 6.84* 6.84*  CALCIUM 7.8*  --  7.4* 7.3* 7.2* 7.3*  MG  --  1.8  --   --   --   --   PHOS  --  8.7*  --  8.3*  --  8.4*   GFR: Estimated Creatinine Clearance: 11.1 mL/min (A) (by C-G formula based on SCr of 6.84 mg/dL (H)). Liver Function Tests: Recent Labs  Lab 12/16/21 1131 12/17/21 0315  12/18/21 0305 12/20/21 0500  AST 27 33  --   --   ALT 34 32  --   --   ALKPHOS 149* 129*  --   --   BILITOT 0.6 0.4  --   --   PROT 6.7 5.8*  --   --   ALBUMIN 3.0* 2.7* 2.5* 2.4*   No results for input(s): "LIPASE", "AMYLASE" in the last 168 hours. No results for input(s): "AMMONIA" in the last 168 hours. Coagulation Profile: No results for input(s): "INR", "PROTIME" in the last 168 hours. Cardiac Enzymes: Recent Labs  Lab 12/16/21 1424  CKTOTAL 166   BNP (last 3 results) No results for input(s): "PROBNP" in the last 8760 hours. HbA1C: No results for input(s): "HGBA1C" in the last 72 hours.  CBG: Recent Labs  Lab 12/19/21 2022 12/19/21 2314 12/20/21 0324 12/20/21 0839 12/20/21 1130  GLUCAP 157* 166* 132* 112* 94   Lipid Profile: No results for input(s): "CHOL", "HDL", "LDLCALC", "TRIG", "CHOLHDL", "LDLDIRECT" in the last 72 hours. Thyroid Function Tests: No results for input(s): "TSH", "T4TOTAL", "FREET4", "T3FREE", "THYROIDAB" in the last 72 hours. Anemia Panel: No results for input(s): "VITAMINB12", "FOLATE", "FERRITIN", "TIBC", "IRON", "RETICCTPCT" in the last 72 hours. Urine analysis:    Component Value Date/Time   COLORURINE YELLOW 12/18/2021 0700   APPEARANCEUR HAZY (A) 12/18/2021 0700   LABSPEC 1.013 12/18/2021 0700   PHURINE 5.0 12/18/2021 0700   GLUCOSEU NEGATIVE 12/18/2021 0700   HGBUR NEGATIVE 12/18/2021 0700   BILIRUBINUR NEGATIVE 12/18/2021 0700   KETONESUR NEGATIVE 12/18/2021 0700   PROTEINUR 100 (A) 12/18/2021 0700   NITRITE NEGATIVE 12/18/2021 0700   LEUKOCYTESUR SMALL (A) 12/18/2021 0700   Sepsis Labs: @LABRCNTIP (procalcitonin:4,lacticidven:4)  ) Recent Results (from the past 240 hour(s))  Resp Panel by RT-PCR (Flu A&B, Covid) Anterior Nasal Swab     Status: None   Collection Time: 12/16/21  2:17 PM   Specimen: Anterior Nasal Swab  Result Value Ref Range Status   SARS Coronavirus 2 by RT PCR NEGATIVE NEGATIVE Final    Comment:  (NOTE) SARS-CoV-2 target nucleic acids are NOT DETECTED.  The SARS-CoV-2 RNA is generally detectable in upper respiratory specimens during the acute phase of infection. The lowest concentration of SARS-CoV-2 viral copies this assay can detect is 138 copies/mL. A negative result does not preclude SARS-Cov-2 infection and should not be used as the sole basis for treatment or other patient management decisions. A negative result may occur with  improper specimen collection/handling, submission of specimen other than nasopharyngeal swab, presence of viral mutation(s) within the areas targeted by this assay, and inadequate number of viral copies(<138 copies/mL). A negative result must be combined with clinical observations, patient history, and epidemiological information. The expected result is Negative.  Fact Sheet for Patients:  EntrepreneurPulse.com.au  Fact Sheet for Healthcare Providers:  IncredibleEmployment.be  This test is no t yet approved or cleared  by the [place] and  has been authorized for detection and/or diagnosis of SARS-CoV-2 by FDA under an Emergency Use Authorization (EUA). This EUA will remain  in effect (meaning this test can be used) for the duration of the COVID-19 declaration under Section 564(b)(1) of the Act, 21 U.S.C.section 360bbb-3(b)(1), unless the authorization is terminated  or revoked sooner.       Influenza A by PCR NEGATIVE NEGATIVE Final   Influenza B by PCR NEGATIVE NEGATIVE Final    Comment: (NOTE) The Xpert Xpress SARS-CoV-2/FLU/RSV plus assay is intended as an aid in the diagnosis of influenza from Nasopharyngeal swab specimens and should not be used as a sole basis for treatment. Nasal washings and aspirates are unacceptable for Xpert Xpress SARS-CoV-2/FLU/RSV testing.  Fact Sheet for Patients: EntrepreneurPulse.com.au  Fact Sheet for Healthcare  Providers: IncredibleEmployment.be  This test is not yet approved or cleared by the [place] FDA and has been authorized for detection and/or diagnosis of SARS-CoV-2 by FDA under an Emergency Use Authorization (EUA). This EUA will remain in effect (meaning this test can be used) for the duration of the COVID-19 declaration under Section 564(b)(1) of the Act, 21 U.S.C. section 360bbb-3(b)(1), unless the authorization is terminated or revoked.  Performed at [facility], [address] [place], [place] [address]          Radiology Studies: NM Pulmonary Perfusion  Result Date: 12/18/2021 CLINICAL DATA:  Positive D-dimer.  Pulmonary embolism suspected. EXAM: NUCLEAR MEDICINE PERFUSION LUNG SCAN TECHNIQUE: Perfusion images were obtained in multiple projections after intravenous injection of radiopharmaceutical. Ventilation scans intentionally deferred if perfusion scan and chest x-ray adequate for interpretation during COVID 19 epidemic. RADIOPHARMACEUTICALS:  4.2 mCi Tc-7m MAA IV COMPARISON:  None available; correlation is made with AP chest 12/18/2021 and 12/16/2021 FINDINGS: There is homogeneous distribution of the perfusion radiotracer throughout the bilateral lungs. No segmental filling defect is seen. IMPRESSION: Normal modified perfusion only V/Q scan.  PE absent. Electronically Signed   By: Yvonne Kendall M.D.   On: 12/18/2021 14:04        Scheduled Meds:  ARIPiprazole  5 mg Oral Daily   aspirin EC  81 mg Oral Daily   atorvastatin  40 mg Oral QHS   busPIRone  7.5 mg Oral BID   Chlorhexidine Gluconate Cloth  6 each Topical Daily   [START ON 12/21/2021] Chlorhexidine Gluconate Cloth  6 each Topical Q0600   darbepoetin (ARANESP) injection - NON-DIALYSIS  60 mcg Subcutaneous Q Tue-1800   DULoxetine  20 mg Oral Daily   furosemide  80 mg Intravenous Once   gabapentin  300 mg Oral QHS   heparin injection (subcutaneous)  5,000 Units Subcutaneous Q8H   metoprolol  succinate  50 mg Oral Daily   pantoprazole  40 mg Oral BID   sevelamer carbonate  800 mg Oral TID WC   sodium chloride flush  10-40 mL Intracatheter Q12H   Continuous Infusions:     LOS: 4 days      Phillips Climes MD, MD  [facility] 7PM-7AM contact night coverage as above

## 2021-12-21 ENCOUNTER — Ambulatory Visit: Payer: 59 | Admitting: Internal Medicine

## 2021-12-21 DIAGNOSIS — N184 Chronic kidney disease, stage 4 (severe): Secondary | ICD-10-CM | POA: Diagnosis not present

## 2021-12-21 DIAGNOSIS — N179 Acute kidney failure, unspecified: Secondary | ICD-10-CM | POA: Diagnosis not present

## 2021-12-21 DIAGNOSIS — D631 Anemia in chronic kidney disease: Secondary | ICD-10-CM

## 2021-12-21 DIAGNOSIS — N189 Chronic kidney disease, unspecified: Secondary | ICD-10-CM

## 2021-12-21 DIAGNOSIS — I25119 Atherosclerotic heart disease of native coronary artery with unspecified angina pectoris: Secondary | ICD-10-CM | POA: Diagnosis not present

## 2021-12-21 LAB — RENAL FUNCTION PANEL
Albumin: 2.4 g/dL — ABNORMAL LOW (ref 3.5–5.0)
Anion gap: 11 (ref 5–15)
BUN: 79 mg/dL — ABNORMAL HIGH (ref 6–20)
CO2: 20 mmol/L — ABNORMAL LOW (ref 22–32)
Calcium: 7.4 mg/dL — ABNORMAL LOW (ref 8.9–10.3)
Chloride: 102 mmol/L (ref 98–111)
Creatinine, Ser: 6.47 mg/dL — ABNORMAL HIGH (ref 0.44–1.00)
GFR, Estimated: 7 mL/min — ABNORMAL LOW (ref >60)
Glucose, Bld: 157 mg/dL — ABNORMAL HIGH (ref 70–99)
Phosphorus: 7.8 mg/dL — ABNORMAL HIGH (ref 2.5–4.6)
Potassium: 4.4 mmol/L (ref 3.5–5.1)
Sodium: 133 mmol/L — ABNORMAL LOW (ref 135–145)

## 2021-12-21 LAB — CBC
HCT: 23 % — ABNORMAL LOW (ref 36.0–46.0)
Hemoglobin: 7.3 g/dL — ABNORMAL LOW (ref 12.0–15.0)
MCH: 30.4 pg (ref 26.0–34.0)
MCHC: 31.7 g/dL (ref 30.0–36.0)
MCV: 95.8 fL (ref 80.0–100.0)
Platelets: 192 10*3/uL (ref 150–400)
RBC: 2.4 MIL/uL — ABNORMAL LOW (ref 3.87–5.11)
RDW: 15.8 % — ABNORMAL HIGH (ref 11.5–15.5)
WBC: 11.9 10*3/uL — ABNORMAL HIGH (ref 4.0–10.5)
nRBC: 0 % (ref 0.0–0.2)

## 2021-12-21 LAB — HEPATITIS B SURFACE ANTIBODY, QUANTITATIVE: Hep B S AB Quant (Post): <3.1 m[IU]/mL — ABNORMAL LOW (ref >9.9)

## 2021-12-21 LAB — GLUCOSE, CAPILLARY
Glucose-Capillary: 159 mg/dL — ABNORMAL HIGH (ref 70–99)
Glucose-Capillary: 184 mg/dL — ABNORMAL HIGH (ref 70–99)
Glucose-Capillary: 215 mg/dL — ABNORMAL HIGH (ref 70–99)
Glucose-Capillary: 201 mg/dL — ABNORMAL HIGH (ref 70–99)

## 2021-12-21 MED ORDER — LOPERAMIDE HCL 2 MG PO CAPS
2.0000 mg | ORAL_CAPSULE | ORAL | Status: DC | PRN
  Administered 2021-12-21 – 2021-12-22 (×3): 2 mg via ORAL
  Filled 2021-12-21 (×3): qty 1

## 2021-12-21 MED ORDER — CHLORHEXIDINE GLUCONATE CLOTH 2 % EX PADS
6.0000 | MEDICATED_PAD | Freq: Every day | CUTANEOUS | Status: DC
  Administered 2021-12-22: 6 via TOPICAL

## 2021-12-21 MED ORDER — EPINEPHRINE HCL (NASAL) 0.1 % NA SOLN
NASAL | Status: AC
  Filled 2021-12-21: qty 30

## 2021-12-21 MED ORDER — HEPARIN SODIUM (PORCINE) 1000 UNIT/ML IJ SOLN
INTRAMUSCULAR | Status: AC
  Administered 2021-12-21: 1000 [IU]
  Filled 2021-12-21: qty 4

## 2021-12-21 NOTE — Progress Notes (Signed)
Pt accepted at [facility] on TTS 11:15 chair time. Pt can start on Tuesday and will need to arrive at 10:15 to complete paperwork prior to treatment. Met with pt and pt's husband at bedside. Provided above details and pt agreeable to plan. Pt also provided information sheet with details noted. Information added to pt's AVS as well. Update provided to attending, nephrologist, pt's RN, and RN CM. Spoke to [place] at [employer] to advise clinic pt may d/c this weekend and start on Tuesday. Will contact clinic on Monday am to provide update and will fax d/c summary at that time if pt was d/c.   Melven Sartorius Renal Navigator [phone]

## 2021-12-21 NOTE — Progress Notes (Signed)
Occupational Therapy Treatment Patient Details Name: Tracy Walsh MRN: [id number] DOB: 02/01/1966 Today's Date: 12/21/2021   History of present illness Pt is a 56 yr old who presented 12/16/21 due to hypoglycemia. PMH  stage IV renal failure, hypertension, hyperlipidemia, combined systolic/diastolic heart failure (ejection fraction 40-45%), coronary artery disease, depression/anxiety, migraines, neuropathy, spinal fustion, shoulder sx, C diff   OT comments  Pt ambulating independently into bathroom upon therapy arrival to complete toileting. Patient provided assist with placing each foot into depends due to hospital socks. Husband provides this assistance at home. Pt completed toileting with min assist for hygiene thoroughness due to BM status. No balance deficits noted. Pt reports that she is feeling much better than she has the last few days. Toilet goal met this session. Pt may discharge this weekend. OT will continue to follow patient acutely until discharge.    Recommendations for follow up therapy are one component of a multi-disciplinary discharge planning process, led by the attending physician.  Recommendations may be updated based on patient status, additional functional criteria and insurance authorization.    Follow Up Recommendations  No OT follow up    Assistance Recommended at Discharge PRN  Patient can return home with the following  A little help with bathing/dressing/bathroom;Assist for transportation;Assistance with cooking/housework   Equipment Recommendations  Toilet rise with handles    Recommendations for Other Services      Precautions / Restrictions Precautions Precautions: Fall Precaution Comments: Pt reported a hx of falls but not in the last 2 months. Pt reports they have decrease in sensation in BLE and they will have weakness feeling like "Jello". Restrictions Weight Bearing Restrictions: No       Mobility     Transfers Overall transfer level:  Modified independent Equipment used: None Transfers: Sit to/from Stand, Bed to chair/wheelchair/BSC Sit to Stand: Modified independent (Device/Increase time) (to and from regular height toilet. Pt heavily relied on grab bar to assist with sit to stand transition)     Step pivot transfers: Modified independent (Device/Increase time)           Balance Overall balance assessment: No apparent balance deficits (not formally assessed)         ADL either performed or assessed with clinical judgement   ADL Overall ADL's : Needs assistance/impaired     Grooming: Wash/dry hands;Standing;Supervision/safety       Lower Body Dressing: Minimal assistance;Sit to/from stand   Toilet Transfer: Supervision/safety;Grab Information systems manager Details (indicate cue type and reason): no mobility device used Toileting- Clothing Manipulation and Hygiene: Minimal assistance;Sit to/from stand       Functional mobility during ADLs: Supervision/safety General ADL Comments: Pt walking into bathroom independently with no device upon therapy arrival.      Cognition Arousal/Alertness: Awake/alert Behavior During Therapy: WFL for tasks assessed/performed Overall Cognitive Status: Within Functional Limits for tasks assessed                       Pertinent Vitals/ Pain       Pain Assessment Pain Assessment: No/denies pain Pain Score: 0-No pain         Frequency  Min 2X/week        Progress Toward Goals  OT Goals(current goals can now be found in the care plan section)  Progress towards OT goals: Progressing toward goals     Plan Discharge plan remains appropriate;Frequency remains appropriate       AM-PAC OT "6 Clicks" Daily Activity  Outcome Measure   Help from another person eating meals?: None Help from another person taking care of personal grooming?: None Help from another person toileting, which includes using toliet, bedpan, or urinal?: A  Little Help from another person bathing (including washing, rinsing, drying)?: A Little Help from another person to put on and taking off regular upper body clothing?: None Help from another person to put on and taking off regular lower body clothing?: A Little 6 Click Score: 21    End of Session    OT Visit Diagnosis: Unsteadiness on feet (R26.81);Other abnormalities of gait and mobility (R26.89);Repeated falls (R29.6);Muscle weakness (generalized) (M62.81);History of falling (Z91.81)   Activity Tolerance Patient tolerated treatment well   Patient Left in bed;with nursing/sitter in room   Nurse Communication Other (comment) (toileting status)        Time: 2669-1675 OT Time Calculation (min): 15 min  Charges: OT General Charges $OT Visit: 1 Visit OT Treatments $Self Care/Home Management : 8-22 mins  Ailene Ravel, OTR/L,CBIS  Supplemental OT - [facility] and [facility]   Ahlam Piscitelli, Clarene Duke 12/21/2021, 2:24 PM

## 2021-12-21 NOTE — TOC Progression Note (Signed)
Transition of Care [facility]) - Progression Note    Patient Details  Name: [name] MRN: [id number] Date of Birth: 1965-06-06  [facility] CM/SW [place], RN Phone Number: 12/21/2021, 9:14 AM  Clinical Narrative:     Secured home health with [facility], in network with [employer]. Orders in for PT OT home health. Cm will follow ofr additional needs.  Expected Discharge Plan: [place] Barriers to Discharge: Continued Medical Work up  Expected Discharge Plan and Services Expected Discharge Plan: [place]   Discharge Planning Services: CM Consult   Living arrangements for the past 2 months: Single Family Home                                       Social Determinants of Health (SDOH) Interventions    Readmission Risk Interventions    08/30/2021    1:38 PM  Readmission Risk Prevention Plan  Transportation Screening Complete  Home Care Screening Complete  Medication Review (RN CM) Complete

## 2021-12-21 NOTE — Progress Notes (Signed)
[facility] Progress Note  Name: Tracy Walsh MRN: [id number] DOB: April 11, 1966  Chief Complaint:  Hypoglycemia and weakness  Subjective:  She had 650 mL UOP over 9/7.  She was started on HD on 9/7 after tunneled catheter placement with IR.  No UF with first treatment.  She feels a little better today.  Updated spouse at bedside.  Review of systems:     Denies shortness of breath  Nausea is better but still an issue Denies chest pain   ------------ Background on consult:  Tracy Walsh is a 56 y.o. female with a history including CKD, combined systolic and diastolic CHF, DM, bipolar disorder, HTN, and CAD who presented to [facility] with hypoglycemia at home.  She was noticing low sugar readings which were accompanied by n/v and weakness.  N/v has been going on for 3-4 days.  She has had trouble with low blood sugar for "a while" but never this low.  She has also had diarrhea recently.  She states that she has also had trouble urinating for about 2 weeks, having to strain with urination.  Per charting, EMS indicated BG was 20's on their arrival to her home.  She was transferred to [facility] with advanced CKD to facilitate access placement should HD be needed.  Her creatinine 5.90 on presentation and 6.16 today on repeat.  Nephrology is consulted for assistance with management of AKI.  She follows with Dr. Theador Hawthorne and was last seen 12/12/21.  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  She had three unmeasured urine voids over 9/3.  She does not currently have any dialysis access.  Has been compliant with home meds including lasix.     Intake/Output Summary (Last 24 hours) at 12/21/2021 1233 Last data filed at 12/20/2021 1923 Gross per 24 hour  Intake --  Output 500 ml  Net -500 ml    Vitals:  Vitals:   12/20/21 2300 12/21/21 0300 12/21/21 0805 12/21/21 1129  BP:  135/72 132/64 120/61 (!) 159/79  Pulse: 65 61 63 68  Resp: 11 15 13 13   Temp: 98.2 F (36.8 C) 98.2 F (36.8 C) 98.2 F (36.8 C) 98.3 F (36.8 C)  TempSrc: Oral Oral Oral Oral  SpO2: 96% 91% (!) 82% 96%  Weight:      Height:         Physical Exam:      General: adult female in bed with nausea HEENT: NCAT  Eyes: EOMI sclera anicteric Neck: supple trachea midline  Heart: S1S2 no rub Lungs: clear and unlabored on room air  Abdomen: soft/nt/nd; obese habitus  Extremities: 1-2+ edema lower extremities Neuro: alert and oriented x 3 provides hx and follows commands Psych normal mood and affect GU foley catheter in place  Medications reviewed   Labs:     Latest Ref Rng & Units 12/21/2021    3:04 AM 12/20/2021    5:00 AM 12/19/2021    4:09 AM  BMP  Glucose 70 - 99 mg/dL 157  115  179   BUN 6 - 20 mg/dL 79  84  81   Creatinine 0.44 - 1.00 mg/dL 6.47  6.84  6.84   Sodium 135 - 145 mmol/L 133  130  133   Potassium 3.5 - 5.1 mmol/L 4.4  4.7  4.9   Chloride 98 - 111 mmol/L 102  99  100   CO2 22 -  32 mmol/L 20  20  20    Calcium 8.9 - 10.3 mg/dL 7.4  7.3  7.2      Assessment/Plan:   # AKI  - pre-renal insults and on home lasix and ARB.  Renal US no hydro with increased echogenicity.  UA with 100 mg/dL proteinuria and had up/cr ratio 2070 mg/g on up/cr ratio.  Urinary retention noted in the setting of worsening AKI - discontinue foley and monitor for retention per nursing protocol  - HD today and tomorrow  - would need an alternative to buspirone and cymbalta as below given her advanced CKD.  Discussed with primary team.  Would at least taper agents and would plan to transition off - She has been accepted at [facility] 2nd shift and can start there Tuesday, 9/12.   Given her stage IV CKD, I anticipate a longer dialysis dependence.  She may even unfortunately have progressed to ESRD however will need to watch as AKI to see if any recovery.   # CKD stage IV  - renal biopsy  12/01/21 and per nephrology note the biopsy demonstrated 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 - baseline Cr is near 2.5 - 3 more recently - follows with Dr. Theador Hawthorne    # Metabolic acidosis  - setting of AKI as well as diarrhea - on HD   # Hyperkalemia  - improved with medical management and now on HD   # Hypoglycemia - Setting of AKI; likely reduced requirement for her diabetes management  - appreciate primary team  - she is off of glimeperide    # Combined systolic and diastolic CHF  - off fluids and s/p lasix as above     # HTN  - improved and back on home beta blocker   # Elevated troponin - Primary team consulted cardiology    # Normocytic anemia   - 12/06/21 outpatient labs with iron 55 and 19% sat - IV iron x 2 doses  - started aranesp 60 mcg weekly on Tuesdays    # CIPD - noted  - will need to reduce gabapentin to a max of 300 mg daily (see this is done) and would need to discuss an alternative to cymbalta with her provider (as even at her baseline renal function she would benefit from an alternate medication)   # Hyperphosphatemia  - transitioned to renal diet  - started renvela with meals  - renal panel in am  Disposition - please continue inpatient monitoring.  After HD tomorrow she is stable for discharge from my standpoint    Claudia Desanctis, MD 12/21/2021 1:00 PM

## 2021-12-21 NOTE — Consult Note (Signed)
   [facility] CM Inpatient Consult   12/21/2021  Kandice Schmelter November 12, 1965 [id number]  [place] [facility] [ACO] Patient: [place] Falling Medicare   Primary Care Provider:  [name], MD with [facility], a provider that is listed for the Transition of Care [TOC] calls and/or followup  Patient screened for hospitalization with noted high risk score for unplanned readmission risk and  to assess for potential [facility] service needs for post hospital transition.  Came to the room and patient is working with PT at this time.  Chart reviewed for post hospital follow up needs, with OT notes, MD progress notes and inpatient [facility] RNCM notes. Met with patient at the bedside.  She endorses PCP and explained 24 hour nurse advise line for post hospital and gave an appointment reminder card to follow up with PCP.  Care team in to take patient to Hemodialysis.  Plan:  Continue to follow progress and disposition to assess for post hospital care management needs.    For questions contact:   Natividad Brood, RN BSN [facility] Liaison  [phone] business mobile phone Toll free office [phone]  Fax number: [phone] [email or web address]

## 2021-12-21 NOTE — Progress Notes (Signed)
Received patient in bed to unit.  Alert and oriented.  Informed consent signed and in chart.   Treatment initiated: 1542 Treatment completed: 1813 Patient tolerated well.  Transported back to the room  Alert, without acute distress.  Hand-off given to patient's nurse.   Access used: Cath Access issues: none  Total UF removed: 1L Medication(s) given: Immodium Post HD VS: 169/81,98%,16,97.6 Post HD weight: 98.0   [facility]

## 2021-12-21 NOTE — Progress Notes (Signed)
Spoke to Fraser with [facility] admissions this am. Hep B's faxed to [facility] this am for review and clinic placement. Awaiting approval.   Melven Sartorius Renal Navigator [phone]

## 2021-12-21 NOTE — Progress Notes (Signed)
Physical Therapy Treatment Patient Details Name: Tracy Walsh MRN: [id number] DOB: 05-12-1965 Today's Date: 12/21/2021   History of Present Illness Pt is a 56 yr old who presented 12/16/21 due to hypoglycemia. PMH  stage IV renal failure, hypertension, hyperlipidemia, combined systolic/diastolic heart failure (ejection fraction 40-45%), coronary artery disease, depression/anxiety, migraines, neuropathy, spinal fustion, shoulder sx, C diff    PT Comments    Pt making excellent progress with mobility and from PT standpoint can return home when medically ready.    Recommendations for follow up therapy are one component of a multi-disciplinary discharge planning process, led by the attending physician.  Recommendations may be updated based on patient status, additional functional criteria and insurance authorization.  Follow Up Recommendations  Home health PT     Assistance Recommended at Discharge Intermittent Supervision/Assistance  Patient can return home with the following Help with stairs or ramp for entrance;Assistance with cooking/housework   Equipment Recommendations  None recommended by PT    Recommendations for Other Services       Precautions / Restrictions Precautions Precautions: Fall Precaution Comments: Pt reported a hx of falls but not in the last 2 months. Pt reports they have decrease in sensation in BLE and they will have weakness feeling like "Jello". Restrictions Weight Bearing Restrictions: No     Mobility  Bed Mobility Overal bed mobility: Modified Independent Bed Mobility: Supine to Sit     Supine to sit: Modified independent (Device/Increase time), HOB elevated          Transfers Overall transfer level: Modified independent Equipment used: Rolling walker (2 wheels) Transfers: Sit to/from Stand Sit to Stand: Modified independent (Device/Increase time)                Ambulation/Gait Ambulation/Gait assistance: Supervision Gait Distance  (Feet): 100 Feet Assistive device: Rolling walker (2 wheels) Gait Pattern/deviations: Step-through pattern, Decreased stride length Gait velocity: decr Gait velocity interpretation: 1.31 - 2.62 ft/sec, indicative of limited community ambulator   General Gait Details: supervision for safety   Stairs             Wheelchair Mobility    Modified Rankin (Stroke Patients Only)       Balance           Standing balance support: No upper extremity supported Standing balance-Leahy Scale: Fair                              Cognition Arousal/Alertness: Awake/alert Behavior During Therapy: WFL for tasks assessed/performed Overall Cognitive Status: Within Functional Limits for tasks assessed                                          Exercises      General Comments General comments (skin integrity, edema, etc.): VSS on RA      Pertinent Vitals/Pain Pain Assessment Pain Assessment: No/denies pain    Home Living                          Prior Function            PT Goals (current goals can now be found in the care plan section) Acute Rehab PT Goals Patient Stated Goal: go home Progress towards PT goals: Progressing toward goals    Frequency    Min 3X/week  PT Plan Current plan remains appropriate    Co-evaluation              AM-PAC PT "6 Clicks" Mobility   Outcome Measure  Help needed turning from your back to your side while in a flat bed without using bedrails?: None Help needed moving from lying on your back to sitting on the side of a flat bed without using bedrails?: None Help needed moving to and from a bed to a chair (including a wheelchair)?: None Help needed standing up from a chair using your arms (e.g., wheelchair or bedside chair)?: None Help needed to walk in hospital room?: A Little Help needed climbing 3-5 steps with a railing? : A Little 6 Click Score: 22    End of Session    Activity Tolerance: Patient tolerated treatment well Patient left: in bed;with call bell/phone within reach   PT Visit Diagnosis: Unsteadiness on feet (R26.81);Other abnormalities of gait and mobility (R26.89);Muscle weakness (generalized) (M62.81);History of falling (Z91.81)     Time: 1694-5038 PT Time Calculation (min) (ACUTE ONLY): 10 min  Charges:  $Gait Training: 8-22 mins                     [place] Office [place] 12/21/2021, 3:09 PM

## 2021-12-21 NOTE — Progress Notes (Signed)
New Dialysis Start   Patient identified as new dialysis start. Kidney Education packet assembled and given. Discussed the following items with patient:    Current medications and possible changes once started:  Discussed that patient's medications may change over time.  Ex; hypertension medications and diabetes medication.  Nephrologists will adjust as needed.  Fluid restrictions reviewed:  32 oz daily goal:  All liquids count; soups, ice, jello   Phosphorus and potassium: Handout given showing high potassium and phosphorus foods.  Alternative food and drink options given.  Family support:  Husband present  Outpatient Clinic Resources:  Discussed roles of Outpatient clinic  staff and advised to make a list of needs, if any, to talk with outpatient staff if needed  Care plan schedule: Informed patient and family member of Care Plans in outpatient setting and to participate in the care plan.  An invitation would be given from outpatient clinic.   Dialysis Access Options:  Reviewed access options with patients. Discussed in detail about care at home with new AVG & AVF. Reviewed checking bruit and thrill. If dialysis catheter present, educated that patient could not take showers.  Catheter dressing changes were to be done by outpatient clinic staff only  Home therapy options:  Educated patient about home therapy options:  PD vs home hemo.  Patient is interested in PD,  [facility] home therapies aware.   Patient verbalized understanding. Will continue to round on patient during admission.    Lilia Argue, RN

## 2021-12-21 NOTE — Progress Notes (Signed)
PROGRESS NOTE    Alissia Lory  TDD:[id number] DOB: May 02, 1965 DOA: 12/16/2021 PCP: [name], MD  Outpatient Specialists:     Brief Narrative:   Patient is a 56 year old Caucasian female with baseline chronic kidney disease stage IV, diabetes mellitus, hyperlipidemia, combined systolic and diastolic congestive heart failure with documented EF of 40 to 45%, coronary artery disease, CIDP, and neuropathy and chronic diarrhea.  According to the patient and patient's husband, patient has had diarrhea for over a year, with history of recurrent C. difficile infection.  Recent worsening of diarrhea was reported by the patient.  Whilst asleep, patient was noted to be sweating significantly and talking.  Patient's blood sugar was checked by the husband and found to be 20.  Patient was brought to the hospital for further assessment and management.  On presentation, patient was found to be in acute kidney injury on chronic kidney disease stage IV, mildly hyperkalemia, elevated troponin and severe metabolic acidosis.  Significant peripheral edema (edema of the lower extremities) noted.  Patient is currently on bicarb drip/D5.  Apparently, patient was on insulin prior to presentation.  Hyperkalemia has responded to medical treatment.  Nephrology input is appreciated.  Cardiology team is also being consulted.  12/17/2021: Patient seen alongside patient's husband.  No new complaints today. 12/18/2021: Patient seen alongside patient's husband.  Patient reports migraine headache.  Nephrology input is appreciated.   Assessment & Plan:   Principal Problem:   Acute renal failure superimposed on stage 4 chronic kidney disease, unspecified acute renal failure type ([place]) Active Problems:   Essential hypertension   Uncontrolled type 2 diabetes mellitus with hyperglycemia, without long-term current use of insulin (HCC)   CAD (coronary artery disease)   Chronic combined systolic and diastolic CHF (congestive heart  failure) (HCC)   CIDP (chronic inflammatory demyelinating polyneuropathy) (HCC)   Metabolic acidosis   Elevated troponin   Hyperkalemia   Nausea & vomiting   GERD (gastroesophageal reflux disease)   Depression with anxiety   HLD (hyperlipidemia)   Acute renal failure superimposed on stage 4 chronic kidney disease, unspecified acute renal failure type (HCC) -Prerenal insults and on home dose Lasix and ARB, renal ultrasound with no hydronephrosis, but with increased echogenicity, UA significant for proteinuria - Patient with stage IV renal failure at baseline -Will avoid nephrotoxic agents, hypotension and contrast; patient's medications will be adjusted to current renal function -With acidosis, initially on bicarb drip, currently on oral bicarb, bicarb remains stable at 20 - renal biopsy 12/01/21 and per nephrology note the biopsy demonstrated 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 -Renal input greatly appreciated, TDC inserted 9/7, and dialysis started on 9/7, she is going for another HD today.   HLD (hyperlipidemia) -continue statin   Depression with anxiety -no SI or hallucinations -resume home antidepressant and anxiolytics meds.  Her BuSpar and Cymbalta dose has been decreased   GERD (gastroesophageal reflux disease) - Continue PPI. -dose adjusted to BID for better assistance relieving symptoms.    Nausea & vomiting -See above discussion,   Hyperkalemia -Management per renal   Elevated troponin - In the setting of demand ischemia versus worsening renal function -Patient with a heart score of 4-5 and significant risk factors. -Will continue to cycle troponin -Telemetry monitoring requested -Holding beta-blockers or any other agents in the setting of soft blood pressure and acute renal failure. -will resume aspirin and statin    Metabolic acidosis - In the setting of worsening renal function -Improved with sodium  bicarb  CIDP  (chronic inflammatory demyelinating polyneuropathy) (HCC) -continue cymbalta and adjusted dose of neuronitn.  Decreased Cymbalta dose due to worsening renal failure   Chronic combined systolic and diastolic CHF (congestive heart failure) (HCC) - Currently holding antihypertensives and diuretics agents in the setting of soft blood pressure and worsening renal function -Follow daily weights and strict intake and output -Most recent echo with ejection fraction 40-45% (March 2023). -Cardiology was consulted and expressed that they can follow patient when she arrived to come in consultation.   CAD (coronary artery disease) -continue ASA and statin   Uncontrolled type 2 diabetes mellitus with hyperglycemia, without long-term current use of insulin (HCC) - Presenting with severe hypoglycemia -At this moment holding oral hypoglycemic agents -Patient experiencing ongoing nausea/vomiting and having difficulty taking by mouth. -Follow CBGs every 4 hours -Now CBG are elevated will DC IV dextrose.   Essential hypertension - Currently soft blood pressure   Hyperphosphatemia -In the setting of worsening renal function -Continue to follow electrolytes trend -Follow nephrology recommendations.   Migraine headache: -Subcutaneous sumatriptan.  Anemia of chronic kidney disease -Hemoglobin of 7.3 today, will repeat in a.m.   DVT prophylaxis: Subcutaneous heparin Code Status: Full code Family Communication: none at bedside Disposition Plan: Home eventually   Consultants:  Nephrology. Cardiology  Procedures:  None  Antimicrobials:  None   Subjective: She still reports nausea, but no vomiting yesterday, she reports weakness.  Objective: Vitals:   12/20/21 2300 12/21/21 0300 12/21/21 0805 12/21/21 1129  BP: 135/72 132/64 120/61 (!) 159/79  Pulse: 65 61 63 68  Resp: 11 15 13 13   Temp: 98.2 F (36.8 C) 98.2 F (36.8 C) 98.2 F (36.8 C) 98.3 F (36.8 C)  TempSrc: Oral Oral Oral  Oral  SpO2: 96% 91% (!) 82% 96%  Weight:      Height:        Intake/Output Summary (Last 24 hours) at 12/21/2021 1255 Last data filed at 12/20/2021 1923 Gross per 24 hour  Intake --  Output 500 ml  Net -500 ml   Filed Weights   12/16/21 1142 12/20/21 1507 12/20/21 1730  Weight: 90.7 kg 100.7 kg 100.8 kg    Examination:  Awake Alert, Oriented X 3, No new F.N deficits, Normal affect Symmetrical Chest wall movement, Good air movement bilaterally, CTAB RRR,No Gallops,Rubs or new Murmurs, No Parasternal Heave +ve B.Sounds, Abd Soft, No tenderness, No rebound - guarding or rigidity. No Cyanosis, Clubbing ,+edema, No new Rash or bruise        Data Reviewed: I have personally reviewed following labs and imaging studies  CBC: Recent Labs  Lab 12/16/21 1131 12/17/21 0315 12/18/21 0305 12/19/21 0409 12/20/21 0500 12/21/21 0304  WBC 11.4* 8.5 10.9* 10.7* 9.4 11.9*  NEUTROABS 9.9*  --   --   --   --   --   HGB 8.4* 7.7* 7.5* 7.6* 7.4* 7.3*  HCT 27.1* 24.0* 24.0* 23.6* 22.5* 23.0*  MCV 98.5 96.4 96.4 94.8 93.0 95.8  PLT 203 204 193 197 212 128   Basic Metabolic Panel: Recent Labs  Lab 12/16/21 1513 12/17/21 0315 12/18/21 0305 12/19/21 0409 12/20/21 0500 12/21/21 0304  NA  --  133* 132* 133* 130* 133*  K  --  4.5 5.0 4.9 4.7 4.4  CL  --  111 104 100 99 102  CO2  --  13* 16* 20* 20* 20*  GLUCOSE  --  46* 79 179* 115* 157*  BUN  --  78* 80* 81* 84*  79*  CREATININE  --  6.16* 6.51* 6.84* 6.84* 6.47*  CALCIUM  --  7.4* 7.3* 7.2* 7.3* 7.4*  MG 1.8  --   --   --   --   --   PHOS 8.7*  --  8.3*  --  8.4* 7.8*   GFR: Estimated Creatinine Clearance: 12.3 mL/min (A) (by C-G formula based on SCr of 6.47 mg/dL (H)). Liver Function Tests: Recent Labs  Lab 12/16/21 1131 12/17/21 0315 12/18/21 0305 12/20/21 0500 12/21/21 0304  AST 27 33  --   --   --   ALT 34 32  --   --   --   ALKPHOS 149* 129*  --   --   --   BILITOT 0.6 0.4  --   --   --   PROT 6.7 5.8*  --   --    --   ALBUMIN 3.0* 2.7* 2.5* 2.4* 2.4*   No results for input(s): "LIPASE", "AMYLASE" in the last 168 hours. No results for input(s): "AMMONIA" in the last 168 hours. Coagulation Profile: No results for input(s): "INR", "PROTIME" in the last 168 hours. Cardiac Enzymes: Recent Labs  Lab 12/16/21 1424  CKTOTAL 166   BNP (last 3 results) No results for input(s): "PROBNP" in the last 8760 hours. HbA1C: No results for input(s): "HGBA1C" in the last 72 hours.  CBG: Recent Labs  Lab 12/20/21 2000 12/20/21 2308 12/21/21 0314 12/21/21 0807 12/21/21 1151  GLUCAP 156* 174* 159* 184* 215*   Lipid Profile: No results for input(s): "CHOL", "HDL", "LDLCALC", "TRIG", "CHOLHDL", "LDLDIRECT" in the last 72 hours. Thyroid Function Tests: No results for input(s): "TSH", "T4TOTAL", "FREET4", "T3FREE", "THYROIDAB" in the last 72 hours. Anemia Panel: No results for input(s): "VITAMINB12", "FOLATE", "FERRITIN", "TIBC", "IRON", "RETICCTPCT" in the last 72 hours. Urine analysis:    Component Value Date/Time   COLORURINE YELLOW 12/18/2021 0700   APPEARANCEUR HAZY (A) 12/18/2021 0700   LABSPEC 1.013 12/18/2021 0700   PHURINE 5.0 12/18/2021 0700   GLUCOSEU NEGATIVE 12/18/2021 0700   HGBUR NEGATIVE 12/18/2021 0700   BILIRUBINUR NEGATIVE 12/18/2021 0700   KETONESUR NEGATIVE 12/18/2021 0700   PROTEINUR 100 (A) 12/18/2021 0700   NITRITE NEGATIVE 12/18/2021 0700   LEUKOCYTESUR SMALL (A) 12/18/2021 0700   Sepsis Labs: @LABRCNTIP (procalcitonin:4,lacticidven:4)  ) Recent Results (from the past 240 hour(s))  Resp Panel by RT-PCR (Flu A&B, Covid) Anterior Nasal Swab     Status: None   Collection Time: 12/16/21  2:17 PM   Specimen: Anterior Nasal Swab  Result Value Ref Range Status   SARS Coronavirus 2 by RT PCR NEGATIVE NEGATIVE Final    Comment: (NOTE) SARS-CoV-2 target nucleic acids are NOT DETECTED.  The SARS-CoV-2 RNA is generally detectable in upper respiratory specimens during the acute  phase of infection. The lowest concentration of SARS-CoV-2 viral copies this assay can detect is 138 copies/mL. A negative result does not preclude SARS-Cov-2 infection and should not be used as the sole basis for treatment or other patient management decisions. A negative result may occur with  improper specimen collection/handling, submission of specimen other than nasopharyngeal swab, presence of viral mutation(s) within the areas targeted by this assay, and inadequate number of viral copies(<138 copies/mL). A negative result must be combined with clinical observations, patient history, and epidemiological information. The expected result is Negative.  Fact Sheet for Patients:  EntrepreneurPulse.com.au  Fact Sheet for Healthcare Providers:  IncredibleEmployment.be  This test is no t yet approved or cleared by the [place]  FDA and  has been authorized for detection and/or diagnosis of SARS-CoV-2 by FDA under an Emergency Use Authorization (EUA). This EUA will remain  in effect (meaning this test can be used) for the duration of the COVID-19 declaration under Section 564(b)(1) of the Act, 21 U.S.C.section 360bbb-3(b)(1), unless the authorization is terminated  or revoked sooner.       Influenza A by PCR NEGATIVE NEGATIVE Final   Influenza B by PCR NEGATIVE NEGATIVE Final    Comment: (NOTE) The Xpert Xpress SARS-CoV-2/FLU/RSV plus assay is intended as an aid in the diagnosis of influenza from Nasopharyngeal swab specimens and should not be used as a sole basis for treatment. Nasal washings and aspirates are unacceptable for Xpert Xpress SARS-CoV-2/FLU/RSV testing.  Fact Sheet for Patients: EntrepreneurPulse.com.au  Fact Sheet for Healthcare Providers: IncredibleEmployment.be  This test is not yet approved or cleared by the [place] FDA and has been authorized for detection and/or diagnosis of  SARS-CoV-2 by FDA under an Emergency Use Authorization (EUA). This EUA will remain in effect (meaning this test can be used) for the duration of the COVID-19 declaration under Section 564(b)(1) of the Act, 21 U.S.C. section 360bbb-3(b)(1), unless the authorization is terminated or revoked.  Performed at [facility], [address] [place], [place] [address]          Radiology Studies: IR Fluoro Guide CV Line Right  Result Date: 12/20/2021 INDICATION: 56 year old female referred for tunneled hemodialysis catheter EXAM: TUNNELED CENTRAL VENOUS HEMODIALYSIS CATHETER PLACEMENT WITH ULTRASOUND AND FLUOROSCOPIC GUIDANCE MEDICATIONS: 1 g vancomycin. The antibiotic was given in an appropriate time interval prior to skin puncture. ANESTHESIA/SEDATION: Moderate (conscious) sedation was employed during this procedure. A total of Versed 1.5 mg and Fentanyl 50 mcg was administered intravenously by the radiology nurse. Total intra-service moderate Sedation Time: 17 minutes. The patient's level of consciousness and vital signs were monitored continuously by radiology nursing throughout the procedure under my direct supervision. FLUOROSCOPY: Radiation Exposure Index (as provided by the fluoroscopic device): 8 mGy Kerma COMPLICATIONS: None PROCEDURE: Informed written consent was obtained from the patient after a discussion of the risks, benefits, and alternatives to treatment. Questions regarding the procedure were encouraged and answered. The right neck and chest were prepped with chlorhexidine in a sterile fashion, and a sterile drape was applied covering the operative field. Maximum barrier sterile technique with sterile gowns and gloves were used for the procedure. A timeout was performed prior to the initiation of the procedure. Ultrasound survey was performed. The right internal jugular vein was confirmed to be patent, with images stored and sent to PACS. Micropuncture kit was utilized to access the right  internal jugular vein under direct, real-time ultrasound guidance after the overlying soft tissues were anesthetized with 1% lidocaine with epinephrine. Stab incision was made with 11 blade scalpel. Microwire was passed centrally. The microwire was then marked to measure appropriate internal catheter length. External tunneled length was estimated. A total tip to cuff length of 19 cm was selected. 035 guidewire was advanced to the level of the IVC. Skin and subcutaneous tissues of chest wall below the clavicle were generously infiltrated with 1% lidocaine for local anesthesia. A small stab incision was made with 11 blade scalpel. The selected hemodialysis catheter was tunneled in a retrograde fashion from the anterior chest wall to the venotomy incision. Serial dilation was performed and then a peel-away sheath was placed. The catheter was then placed through the peel-away sheath with tips ultimately positioned within the superior aspect of  the right atrium. Final catheter positioning was confirmed and documented with a spot radiographic image. The catheter aspirates and flushes normally. The catheter was flushed with appropriate volume heparin dwells. The catheter exit site was secured with a 0-Prolene retention suture. Gel-Foam slurry was infused into the soft tissue tract. The venotomy incision was closed Derma bond and sterile dressing. Dressings were applied at the chest wall. Patient tolerated the procedure well and remained hemodynamically stable throughout. No complications were encountered and no significant blood loss encountered. IMPRESSION: Status post right IJ tunneled hemodialysis catheter. Signed, Dulcy Fanny. Nadene Rubins, RPVI [facility] Radiology Electronically Signed   By: Corrie Mckusick D.O.   On: 12/20/2021 14:44   IR US Guide Vasc Access Right  Result Date: 12/20/2021 INDICATION: 56 year old female referred for tunneled hemodialysis catheter  EXAM: TUNNELED CENTRAL VENOUS HEMODIALYSIS CATHETER PLACEMENT WITH ULTRASOUND AND FLUOROSCOPIC GUIDANCE MEDICATIONS: 1 g vancomycin. The antibiotic was given in an appropriate time interval prior to skin puncture. ANESTHESIA/SEDATION: Moderate (conscious) sedation was employed during this procedure. A total of Versed 1.5 mg and Fentanyl 50 mcg was administered intravenously by the radiology nurse. Total intra-service moderate Sedation Time: 17 minutes. The patient's level of consciousness and vital signs were monitored continuously by radiology nursing throughout the procedure under my direct supervision. FLUOROSCOPY: Radiation Exposure Index (as provided by the fluoroscopic device): 8 mGy Kerma COMPLICATIONS: None PROCEDURE: Informed written consent was obtained from the patient after a discussion of the risks, benefits, and alternatives to treatment. Questions regarding the procedure were encouraged and answered. The right neck and chest were prepped with chlorhexidine in a sterile fashion, and a sterile drape was applied covering the operative field. Maximum barrier sterile technique with sterile gowns and gloves were used for the procedure. A timeout was performed prior to the initiation of the procedure. Ultrasound survey was performed. The right internal jugular vein was confirmed to be patent, with images stored and sent to PACS. Micropuncture kit was utilized to access the right internal jugular vein under direct, real-time ultrasound guidance after the overlying soft tissues were anesthetized with 1% lidocaine with epinephrine. Stab incision was made with 11 blade scalpel. Microwire was passed centrally. The microwire was then marked to measure appropriate internal catheter length. External tunneled length was estimated. A total tip to cuff length of 19 cm was selected. 035 guidewire was advanced to the level of the IVC. Skin and subcutaneous tissues of chest wall below the clavicle were generously  infiltrated with 1% lidocaine for local anesthesia. A small stab incision was made with 11 blade scalpel. The selected hemodialysis catheter was tunneled in a retrograde fashion from the anterior chest wall to the venotomy incision. Serial dilation was performed and then a peel-away sheath was placed. The catheter was then placed through the peel-away sheath with tips ultimately positioned within the superior aspect of the right atrium. Final catheter positioning was confirmed and documented with a spot radiographic image. The catheter aspirates and flushes normally. The catheter was flushed with appropriate volume heparin dwells. The catheter exit site was secured with a 0-Prolene retention suture. Gel-Foam slurry was infused into the soft tissue tract. The venotomy incision was closed Derma bond and sterile dressing. Dressings were applied at the chest wall. Patient tolerated the procedure well and remained hemodynamically stable throughout. No complications were encountered and no significant blood loss encountered. IMPRESSION: Status post right IJ tunneled hemodialysis catheter. Signed, Dulcy Fanny. Dellia Nims, ABVM, RPVI Vascular and Interventional Radiology  Specialists [facility] Radiology Electronically Signed   By: Corrie Mckusick D.O.   On: 12/20/2021 14:44        Scheduled Meds:  ARIPiprazole  5 mg Oral Daily   aspirin EC  81 mg Oral Daily   atorvastatin  40 mg Oral QHS   busPIRone  7.5 mg Oral BID   Chlorhexidine Gluconate Cloth  6 each Topical Daily   Chlorhexidine Gluconate Cloth  6 each Topical Q0600   Chlorhexidine Gluconate Cloth  6 each Topical Q0600   darbepoetin (ARANESP) injection - NON-DIALYSIS  60 mcg Subcutaneous Q Tue-1800   DULoxetine  20 mg Oral Daily   gabapentin  300 mg Oral QHS   heparin injection (subcutaneous)  5,000 Units Subcutaneous Q8H   metoprolol succinate  50 mg Oral Daily   pantoprazole  40 mg Oral BID   sevelamer carbonate  800 mg Oral TID WC   sodium chloride  flush  10-40 mL Intracatheter Q12H   Continuous Infusions:     LOS: 5 days      Phillips Climes MD, MD  [facility] 7PM-7AM contact night coverage as above

## 2021-12-22 DIAGNOSIS — N179 Acute kidney failure, unspecified: Secondary | ICD-10-CM | POA: Diagnosis not present

## 2021-12-22 DIAGNOSIS — N184 Chronic kidney disease, stage 4 (severe): Secondary | ICD-10-CM | POA: Diagnosis not present

## 2021-12-22 DIAGNOSIS — I25119 Atherosclerotic heart disease of native coronary artery with unspecified angina pectoris: Secondary | ICD-10-CM | POA: Diagnosis not present

## 2021-12-22 DIAGNOSIS — I5042 Chronic combined systolic (congestive) and diastolic (congestive) heart failure: Secondary | ICD-10-CM | POA: Diagnosis not present

## 2021-12-22 LAB — RENAL FUNCTION PANEL
Albumin: 2.4 g/dL — ABNORMAL LOW (ref 3.5–5.0)
Anion gap: 10 (ref 5–15)
BUN: 53 mg/dL — ABNORMAL HIGH (ref 6–20)
CO2: 23 mmol/L (ref 22–32)
Calcium: 7.3 mg/dL — ABNORMAL LOW (ref 8.9–10.3)
Chloride: 101 mmol/L (ref 98–111)
Creatinine, Ser: 4.8 mg/dL — ABNORMAL HIGH (ref 0.44–1.00)
GFR, Estimated: 10 mL/min — ABNORMAL LOW (ref >60)
Glucose, Bld: 167 mg/dL — ABNORMAL HIGH (ref 70–99)
Phosphorus: 5.5 mg/dL — ABNORMAL HIGH (ref 2.5–4.6)
Potassium: 3.3 mmol/L — ABNORMAL LOW (ref 3.5–5.1)
Sodium: 134 mmol/L — ABNORMAL LOW (ref 135–145)

## 2021-12-22 LAB — CBC
HCT: 21.1 % — ABNORMAL LOW (ref 36.0–46.0)
Hemoglobin: 7 g/dL — ABNORMAL LOW (ref 12.0–15.0)
MCH: 31.3 pg (ref 26.0–34.0)
MCHC: 33.2 g/dL (ref 30.0–36.0)
MCV: 94.2 fL (ref 80.0–100.0)
Platelets: 168 10*3/uL (ref 150–400)
RBC: 2.24 MIL/uL — ABNORMAL LOW (ref 3.87–5.11)
RDW: 15.4 % (ref 11.5–15.5)
WBC: 10.8 10*3/uL — ABNORMAL HIGH (ref 4.0–10.5)
nRBC: 0 % (ref 0.0–0.2)

## 2021-12-22 LAB — PREPARE RBC (CROSSMATCH)

## 2021-12-22 LAB — GLUCOSE, CAPILLARY
Glucose-Capillary: 164 mg/dL — ABNORMAL HIGH (ref 70–99)
Glucose-Capillary: 176 mg/dL — ABNORMAL HIGH (ref 70–99)
Glucose-Capillary: 192 mg/dL — ABNORMAL HIGH (ref 70–99)
Glucose-Capillary: 95 mg/dL (ref 70–99)

## 2021-12-22 MED ORDER — GABAPENTIN 100 MG PO CAPS: 200.0000 mg | ORAL_CAPSULE | Freq: Every day | ORAL | 2 refills | Status: AC

## 2021-12-22 MED ORDER — BUSPIRONE HCL 7.5 MG PO TABS: 7.5000 mg | ORAL_TABLET | Freq: Two times a day (BID) | ORAL | 0 refills | Status: DC

## 2021-12-22 MED ORDER — FUROSEMIDE 80 MG PO TABS: ORAL_TABLET | ORAL | 12 refills | Status: AC

## 2021-12-22 MED ORDER — HEPARIN SOD (PORK) LOCK FLUSH 100 UNIT/ML IV SOLN
500.0000 [IU] | INTRAVENOUS | Status: AC | PRN
  Administered 2021-12-22: 500 [IU]

## 2021-12-22 MED ORDER — SODIUM CHLORIDE 0.9% IV SOLUTION: Freq: Once | INTRAVENOUS | Status: AC

## 2021-12-22 MED ORDER — SEVELAMER CARBONATE 800 MG PO TABS: 800.0000 mg | ORAL_TABLET | Freq: Three times a day (TID) | ORAL | 0 refills | Status: AC

## 2021-12-22 MED ORDER — ONDANSETRON HCL 4 MG PO TABS: 4.0000 mg | ORAL_TABLET | Freq: Every day | ORAL | 1 refills | Status: AC | PRN

## 2021-12-22 MED ORDER — FUROSEMIDE 80 MG PO TABS
ORAL_TABLET | ORAL | 12 refills | Status: DC
Start: 2021-12-22 — End: 2021-12-22

## 2021-12-22 MED ORDER — DULOXETINE HCL 20 MG PO CPEP: 20.0000 mg | ORAL_CAPSULE | Freq: Every day | ORAL | 0 refills | Status: DC

## 2021-12-22 MED ORDER — FUROSEMIDE 10 MG/ML IJ SOLN
80.0000 mg | Freq: Once | INTRAMUSCULAR | Status: AC
  Administered 2021-12-22: 80 mg via INTRAVENOUS
  Filled 2021-12-22: qty 8

## 2021-12-22 MED ORDER — HEPARIN SODIUM (PORCINE) 1000 UNIT/ML IJ SOLN
INTRAMUSCULAR | Status: AC
  Administered 2021-12-22: 1000 [IU]
  Filled 2021-12-22: qty 4

## 2021-12-22 NOTE — Progress Notes (Signed)
Received patient in bed to unit.  Alert and oriented.  Informed consent signed and in chart.   Treatment initiated: 0732 Treatment completed: 1036  Patient tolerated well.  Transported back to the room  Alert, without acute distress.  Hand-off given to patient's nurse.   Access used: Cath Access issues: none  Total UF removed: 2.0 Medication(s) given: none Post HD VS: 98.1,72,18,162/82 Post HD weight: 96.0kg   [facility]

## 2021-12-22 NOTE — Progress Notes (Signed)
[facility] Progress Note  Name: Anniebell Bedore MRN: [id number] DOB: 05/29/1965  Chief Complaint:  Hypoglycemia and weakness  Subjective:  She had 1 liter UOP over 9/8.  She had HD earlier today and had 2 kg UF - just got off.  She and I discussed risks/benefits/indications for PRBC's and she does consent to PRBC's.   Review of systems:      Denies shortness of breath  Has had nausea Denies chest pain   ------------ Background on consult:  Melisia Leming is a 55 y.o. female with a history including CKD, combined systolic and diastolic CHF, DM, bipolar disorder, HTN, and CAD who presented to [facility] with hypoglycemia at home.  She was noticing low sugar readings which were accompanied by n/v and weakness.  N/v has been going on for 3-4 days.  She has had trouble with low blood sugar for "a while" but never this low.  She has also had diarrhea recently.  She states that she has also had trouble urinating for about 2 weeks, having to strain with urination.  Per charting, EMS indicated BG was 20's on their arrival to her home.  She was transferred to [facility] with advanced CKD to facilitate access placement should HD be needed.  Her creatinine 5.90 on presentation and 6.16 today on repeat.  Nephrology is consulted for assistance with management of AKI.  She follows with Dr. Theador Hawthorne and was last seen 12/12/21.  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  She had three unmeasured urine voids over 9/3.  She does not currently have any dialysis access.  Has been compliant with home meds including lasix.     Intake/Output Summary (Last 24 hours) at 12/22/2021 1058 Last data filed at 12/22/2021 1046 Gross per 24 hour  Intake --  Output 1003 ml  Net -1003 ml    Vitals:  Vitals:   12/22/21 1000 12/22/21 1030 12/22/21 1036 12/22/21 1046  BP: (!) 156/70 (!) 159/74 (!) 162/82 (!)  162/82  Pulse: 75 73 70 72  Resp: (!) 21 20 13 18   Temp:    98.1 F (36.7 C)  TempSrc:      SpO2: 94% 97% 100% 100%  Weight:    96 kg  Height:         Physical Exam:       General: adult female in bed with nausea HEENT: NCAT  Eyes: EOMI sclera anicteric Neck: supple trachea midline  Heart: S1S2 no rub Lungs: clear and unlabored on room air  Abdomen: soft/nt/nd; obese habitus  Extremities: 1-2+ edema lower extremities Neuro: alert and oriented x 3 provides hx and follows commands Psych normal mood and affect GU no foley   Medications reviewed   Labs:     Latest Ref Rng & Units 12/22/2021    3:41 AM 12/21/2021    3:04 AM 12/20/2021    5:00 AM  BMP  Glucose 70 - 99 mg/dL 167  157  115   BUN 6 - 20 mg/dL 53  79  84   Creatinine 0.44 - 1.00 mg/dL 4.80  6.47  6.84   Sodium 135 - 145 mmol/L 134  133  130   Potassium 3.5 - 5.1 mmol/L 3.3  4.4  4.7   Chloride 98 - 111 mmol/L 101  102  99   CO2 22 - 32 mmol/L 23  20  20  Calcium 8.9 - 10.3 mg/dL 7.3  7.4  7.3      Assessment/Plan:   # AKI  - pre-renal insults and on home lasix and ARB.  Renal US no hydro with increased echogenicity.  UA with 100 mg/dL proteinuria and had up/cr ratio 2070 mg/g on up/cr ratio.  Urinary retention noted in the setting of worsening AKI - check post-void residual bladder scan to ensure no retention - HD today   - She has been accepted at [facility] 2nd shift and can start there Tuesday, 9/12.   Given her stage IV CKD, I anticipate a longer dialysis dependence.  She may even unfortunately have progressed to ESRD however will need to watch as AKI to see if any recovery. - would need an alternative to buspirone and cymbalta as below given her advanced CKD.  Discussed with primary team.  Would at least taper agents and would plan to transition off   # CKD stage IV  - renal biopsy 12/01/21 and per nephrology note the biopsy demonstrated diabetic kidney disease with severe fibrosis and severe tubular  atrophy. - baseline Cr is near 2.5 - 3 more recently - follows with Dr. Theador Hawthorne    # Metabolic acidosis  - setting of AKI as well as diarrhea - on HD   # Hyperkalemia  - improved with medical management and now on HD   # Hypoglycemia - Setting of AKI; likely reduced requirement for her diabetes management  - appreciate primary team  - she is off of glimeperide    # Combined systolic and diastolic CHF  - off fluids and s/p lasix as above     # HTN  - back on home beta blocker - Lasix 80 mg IV once  - Would continue lasix 80 mg PO daily on non-dialysis days   # Elevated troponin - Primary team consulted cardiology    # Normocytic anemia   - Will transfuse 1 unit PRBC's today - she consents as above  - 12/06/21 outpatient labs with iron 55 and 19% sat - IV iron x 2 doses  - started aranesp 60 mcg weekly on Tuesdays    # CIPD - noted  - will need to reduce gabapentin to a max of 300 mg daily (see this is done) and would need to discuss an alternative to cymbalta with her provider (as even at her baseline renal function she would benefit from an alternate medication)   # Hyperphosphatemia  - transitioned to renal diet  - on renvela with meals  - renal panel in am  Disposition - Per primary team.  As she has had HD today she is stable for discharge from my standpoint after she gets the blood today   Claudia Desanctis, MD 12/22/2021 11:16 AM

## 2021-12-22 NOTE — TOC Transition Note (Signed)
Transition of Care [facility]) - CM/SW Discharge Note   Patient Details  Name: Tracy Walsh MRN: [id number] Date of Birth: Apr 28, 1965  [facility] CM/SW Contact:  Carles Collet, RN Phone Number: 12/22/2021, 2:05 PM   Clinical Narrative:     Notified [facility] of DC today. No other TOC needs identified    Barriers to Discharge: Continued Medical Work up   Patient Goals and CMS Choice Patient states their goals for this hospitalization and ongoing recovery are:: return home      Discharge Placement                       Discharge Plan and Services   Discharge Planning Services: CM Consult                                 Social Determinants of Health (SDOH) Interventions     Readmission Risk Interventions    08/30/2021    1:38 PM  Readmission Risk Prevention Plan  Transportation Screening Complete  Home Care Screening Complete  Medication Review (RN CM) Complete

## 2021-12-22 NOTE — Discharge Instructions (Signed)
Follow with Primary MD [name], MD in 7 days   Get CBC, CMP,  checked  by Primary MD next visit.    Activity: As tolerated with Full fall precautions use walker/cane & assistance as needed   Disposition Home    Diet: Renal diet  On your next visit with your primary care physician please Get Medicines reviewed and adjusted.   Please request your Prim.MD to go over all Hospital Tests and Procedure/Radiological results at the follow up, please get all Hospital records sent to your Prim MD by signing hospital release before you go home.   If you experience worsening of your admission symptoms, develop shortness of breath, life threatening emergency, suicidal or homicidal thoughts you must seek medical attention immediately by calling 911 or calling your MD immediately  if symptoms less severe.  You Must read complete instructions/literature along with all the possible adverse reactions/side effects for all the Medicines you take and that have been prescribed to you. Take any new Medicines after you have completely understood and accpet all the possible adverse reactions/side effects.   Do not drive, operating heavy machinery, perform activities at heights, swimming or participation in water activities or provide baby sitting services if your were admitted for syncope or siezures until you have seen by Primary MD or a Neurologist and advised to do so again.  Do not drive when taking Pain medications.    Do not take more than prescribed Pain, Sleep and Anxiety Medications  Special Instructions: If you have smoked or chewed Tobacco  in the last 2 yrs please stop smoking, stop any regular Alcohol  and or any Recreational drug use.  Wear Seat belts while driving.   Please note  You were cared for by a hospitalist during your hospital stay. If you have any questions about your discharge medications or the care you received while you were in the hospital after you are discharged,  you can call the unit and asked to speak with the hospitalist on call if the hospitalist that took care of you is not available. Once you are discharged, your primary care physician will handle any further medical issues. Please note that NO REFILLS for any discharge medications will be authorized once you are discharged, as it is imperative that you return to your primary care physician (or establish a relationship with a primary care physician if you do not have one) for your aftercare needs so that they can reassess your need for medications and monitor your lab values.

## 2021-12-22 NOTE — Discharge Summary (Signed)
Physician Discharge Summary  Tracy Walsh TGY:[id number] DOB: 1966/04/02 DOA: 12/16/2021  PCP: [name], MD  Admit date: 12/16/2021 Discharge date: 12/22/2021  Admitted From: Home Disposition:  Home  Recommendations for Outpatient Follow-up:  Follow up with PCP in 1-2 weeks Please obtain BMP/CBC in one week Continue to taper off Cymbalta and buspirone, her dose has been tapered, continue the gradual tapering until stopping as an outpatient   Home Health:YES   Discharge Condition:Stable CODE STATUS:FULL Diet recommendation: Renal / Carb Modified   Brief/Interim Summary:  Patient is a 56 year old Caucasian female with baseline chronic kidney disease stage IV, diabetes mellitus, hyperlipidemia, combined systolic and diastolic congestive heart failure with documented EF of 40 to 45%, coronary artery disease, CIDP, and neuropathy and chronic diarrhea.  According to the patient and patient's husband, patient has had diarrhea for over a year, with history of recurrent C. difficile infection.  Recent worsening of diarrhea was reported by the patient.  Whilst asleep, patient was noted to be sweating significantly and talking.  Patient's blood sugar was checked by the husband and found to be 20.  Patient was brought to the hospital for further assessment and management.  On presentation, patient was found to be in acute kidney injury on chronic kidney disease stage IV, mildly hyperkalemia, elevated troponin and severe metabolic acidosis.  Significant peripheral edema (edema of the lower extremities) noted, she was initially managed with D5 bicarb drip, her acidosis has improved, but she kept having significant nausea and vomiting, no further hypoglycemia on the D5 drip, but overall patient general function continued to worsen, she was quite symptomatic with uremia with nausea and vomiting, creatinine peaked at 6.85, BUN peaked at 84, TDC was inserted and patient was started on HD.    Acute renal  failure superimposed on stage 4 chronic kidney disease,  unspecified acute renal failure type (HCC) -Prerenal insults and on home dose Lasix and ARB, renal ultrasound with no hydronephrosis, but with increased echogenicity, UA significant for proteinuria - Patient with stage IV renal failure at baseline -Will avoid nephrotoxic agents, hypotension and contrast; patient's medications will be adjusted to current renal function -With acidosis, initially on bicarb drip, this has resolved, bicarb is 23 on discharge - renal biopsy 12/01/21 and per nephrology note the biopsy demonstrated 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 -Renal input greatly appreciated, TDC inserted 9/7, and dialysis started on 9/7, he was dialyzed on 9/7, 9/8, 9/9, and she is to continue dialysis on TTS scheduled as an outpatient    HLD (hyperlipidemia) -continue statin   Depression with anxiety -no SI or hallucinations -resume home antidepressant and anxiolytics meds.  Her BuSpar and Cymbalta dose has been decreased during hospital stay, and to be tapered off gradually after discharge.   GERD (gastroesophageal reflux disease) - Continue PPI.   Nausea & vomiting -Due to uremia, significantly improved with dialysis   Hyperkalemia -Management per renal   Elevated troponin -Due to renal failure     Metabolic acidosis - In the setting of worsening renal function -Improved with sodium bicarb   CIDP (chronic inflammatory demyelinating polyneuropathy) (HCC) -continue cymbalta and adjusted dose of neuronitn.  Decreased Cymbalta dose due to worsening renal failure   Chronic combined systolic and diastolic CHF (congestive heart failure) (HCC) -Volume management with HD.   CAD (coronary artery disease) -continue ASA and statin   Uncontrolled type 2 diabetes mellitus with hyperglycemia, without long-term current use of insulin (HCC) -1C is 6.3, overall she  was hypoglycemic during  hospital stay, she did not require much insulin as well, so we will hold oral oral hypoglycemic agent (including branding on discharge, and to follow closely with carb modified diet   Essential hypertension -Was medication has been discontinued, she was kept on Imdur and metoprolol with good blood pressure control   Hyperphosphatemia -Management with HD   Migraine headache: -Subcutaneous sumatriptan.   Anemia of chronic kidney disease -Hemoglobin is 7 this morning, she will receive 1 unit PRBC today before discharge.    Discharge Diagnoses:  Principal Problem:   Acute renal failure superimposed on stage 4 chronic kidney disease, unspecified acute renal failure type (HCC) Active Problems:   Essential hypertension   Uncontrolled type 2 diabetes mellitus with hyperglycemia, without long-term current use of insulin (HCC)   CAD (coronary artery disease)   Chronic combined systolic and diastolic CHF (congestive heart failure) (HCC)   CIDP (chronic inflammatory demyelinating polyneuropathy) (HCC)   Metabolic acidosis   Elevated troponin   Hyperkalemia   Nausea & vomiting   GERD (gastroesophageal reflux disease)   Depression with anxiety   HLD (hyperlipidemia)    Discharge Instructions  Discharge Instructions     Diet - low sodium heart healthy   Complete by: As directed    Discharge instructions   Complete by: As directed    Follow with Primary MD [name], MD in 7 days   Get CBC, CMP,  checked  by Primary MD next visit.    Activity: As tolerated with Full fall precautions use walker/cane & assistance as needed   Disposition Home    Diet: Renal diet/carb modified  On your next visit with your primary care physician please Get Medicines reviewed and adjusted.   Please request your Prim.MD to go over all Hospital Tests and Procedure/Radiological results at the follow up, please get all Hospital records sent to your Prim MD by signing hospital release before  you go home.   If you experience worsening of your admission symptoms, develop shortness of breath, life threatening emergency, suicidal or homicidal thoughts you must seek medical attention immediately by calling 911 or calling your MD immediately  if symptoms less severe.  You Must read complete instructions/literature along with all the possible adverse reactions/side effects for all the Medicines you take and that have been prescribed to you. Take any new Medicines after you have completely understood and accpet all the possible adverse reactions/side effects.   Do not drive, operating heavy machinery, perform activities at heights, swimming or participation in water activities or provide baby sitting services if your were admitted for syncope or siezures until you have seen by Primary MD or a Neurologist and advised to do so again.  Do not drive when taking Pain medications.    Do not take more than prescribed Pain, Sleep and Anxiety Medications  Special Instructions: If you have smoked or chewed Tobacco  in the last 2 yrs please stop smoking, stop any regular Alcohol  and or any Recreational drug use.  Wear Seat belts while driving.   Please note  You were cared for by a hospitalist during your hospital stay. If you have any questions about your discharge medications or the care you received while you were in the hospital after you are discharged, you can call the unit and asked to speak with the hospitalist on call if the hospitalist that took care of you is not available. Once you are discharged, your primary care physician will handle  any further medical issues. Please note that NO REFILLS for any discharge medications will be authorized once you are discharged, as it is imperative that you return to your primary care physician (or establish a relationship with a primary care physician if you do not have one) for your aftercare needs so that they can reassess your need for medications  and monitor your lab values.   Increase activity slowly   Complete by: As directed    No wound care   Complete by: As directed       Allergies as of 12/22/2021       Reactions   Amoxicillin Anaphylaxis   Clarithromycin Anaphylaxis, Diarrhea, Itching, Nausea And Vomiting, Rash   Codeine Itching   Hydrocodone Itching   Moxifloxacin Anaphylaxis, Anxiety, Hives, Itching, Nausea And Vomiting, Palpitations, Shortness Of Breath   Penicillins Anaphylaxis   Sulfa Antibiotics Anaphylaxis   Nitrofurantoin Nausea And Vomiting   Crab (diagnostic) Hives   Latex Hives, Itching, Rash, Swelling        Medication List     STOP taking these medications    amitriptyline 10 MG tablet Commonly known as: ELAVIL   atorvastatin 40 MG tablet Commonly known as: LIPITOR   calcitRIOL 0.25 MCG capsule Commonly known as: ROCALTROL   doxazosin 4 MG tablet Commonly known as: CARDURA   glimepiride 2 MG tablet Commonly known as: AMARYL   HAIR/SKIN/NAILS/BIOTIN PO   hydrALAZINE 50 MG tablet Commonly known as: APRESOLINE   multivitamin with minerals tablet   telmisartan 20 MG tablet Commonly known as: MICARDIS       TAKE these medications    albuterol (2.5 MG/3ML) 0.083% nebulizer solution Commonly known as: PROVENTIL Take 2.5 mg by nebulization every 6 (six) hours as needed for wheezing or shortness of breath.   ARIPiprazole 5 MG tablet Commonly known as: ABILIFY Take 5 mg by mouth daily.   aspirin EC 81 MG tablet Take 81 mg by mouth daily. Swallow whole.   busPIRone 7.5 MG tablet Commonly known as: BUSPAR Take 1 tablet (7.5 mg total) by mouth 2 (two) times daily. What changed:  medication strength how much to take   Cholecalciferol 25 MCG (1000 UT) tablet Take 1,000 Units by mouth daily.   cyanocobalamin 1000 MCG/ML injection Commonly known as: VITAMIN B12 Inject 1,000 mcg into the muscle every 30 (thirty) days.   DULoxetine 20 MG capsule Commonly known as:  CYMBALTA Take 1 capsule (20 mg total) by mouth daily. Start taking on: December 23, 2021 What changed:  medication strength how much to take   ferrous sulfate 325 (65 FE) MG EC tablet Take 325 mg by mouth 2 (two) times daily.   furosemide 80 MG tablet Commonly known as: LASIX Please take 80 mg of Lasix on HD days on Sunday, Monday, Wednesday and Friday What changed:  how much to take how to take this when to take this additional instructions   gabapentin 100 MG capsule Commonly known as: NEURONTIN Take 2 capsules (200 mg total) by mouth at bedtime. What changed: when to take this   isosorbide mononitrate 30 MG 24 hr tablet Commonly known as: IMDUR Take 30 mg by mouth every morning.   metoprolol succinate 50 MG 24 hr tablet Commonly known as: TOPROL-XL Take 1 tablet (50 mg total) by mouth daily.   mupirocin ointment 2 % Commonly known as: BACTROBAN Apply 1 Application topically daily.   omeprazole 20 MG capsule Commonly known as: PRILOSEC Take 20 mg by mouth daily.  PROBIOTIC PO Take 1 capsule by mouth daily.   sevelamer carbonate 800 MG tablet Commonly known as: RENVELA Take 1 tablet (800 mg total) by mouth 3 (three) times daily with meals.   vitamin C 1000 MG tablet Take 1,000 mg by mouth daily.   zolpidem 5 MG tablet Commonly known as: AMBIEN Take 1 tablet (5 mg total) by mouth at bedtime.        Follow-up [place]. Follow up.   Why: they will call you within 24-48 hours to set up services Contact information: [place] [address] [phone]         [place], [place] on 12/25/2021.   Why: Schedule is Tuesday/Thursday/Saturday with 11:15 chair time.  For first appointment, please arrive at 10:15 to complete paperwork prior to treatment. Contact information: [address] [place] [address] [phone]                Allergies  Allergen  Reactions   Amoxicillin Anaphylaxis   Clarithromycin Anaphylaxis, Diarrhea, Itching, Nausea And Vomiting and Rash   Codeine Itching   Hydrocodone Itching   Moxifloxacin Anaphylaxis, Anxiety, Hives, Itching, Nausea And Vomiting, Palpitations and Shortness Of Breath   Penicillins Anaphylaxis   Sulfa Antibiotics Anaphylaxis   Nitrofurantoin Nausea And Vomiting   Crab (Diagnostic) Hives   Latex Hives, Itching, Rash and Swelling    Consultations: Renal cardiology   Procedures/Studies: IR Fluoro Guide CV Line Right  Result Date: 12/20/2021 INDICATION: 57 year old female referred for tunneled hemodialysis catheter EXAM: TUNNELED CENTRAL VENOUS HEMODIALYSIS CATHETER PLACEMENT WITH ULTRASOUND AND FLUOROSCOPIC GUIDANCE MEDICATIONS: 1 g vancomycin. The antibiotic was given in an appropriate time interval prior to skin puncture. ANESTHESIA/SEDATION: Moderate (conscious) sedation was employed during this procedure. A total of Versed 1.5 mg and Fentanyl 50 mcg was administered intravenously by the radiology nurse. Total intra-service moderate Sedation Time: 17 minutes. The patient's level of consciousness and vital signs were monitored continuously by radiology nursing throughout the procedure under my direct supervision. FLUOROSCOPY: Radiation Exposure Index (as provided by the fluoroscopic device): 8 mGy Kerma COMPLICATIONS: None PROCEDURE: Informed written consent was obtained from the patient after a discussion of the risks, benefits, and alternatives to treatment. Questions regarding the procedure were encouraged and answered. The right neck and chest were prepped with chlorhexidine in a sterile fashion, and a sterile drape was applied covering the operative field. Maximum barrier sterile technique with sterile gowns and gloves were used for the procedure. A timeout was performed prior to the initiation of the procedure. Ultrasound survey was performed. The right internal jugular vein was confirmed to be  patent, with images stored and sent to PACS. Micropuncture kit was utilized to access the right internal jugular vein under direct, real-time ultrasound guidance after the overlying soft tissues were anesthetized with 1% lidocaine with epinephrine. Stab incision was made with 11 blade scalpel. Microwire was passed centrally. The microwire was then marked to measure appropriate internal catheter length. External tunneled length was estimated. A total tip to cuff length of 19 cm was selected. 035 guidewire was advanced to the level of the IVC. Skin and subcutaneous tissues of chest wall below the clavicle were generously infiltrated with 1% lidocaine for local anesthesia. A small stab incision was made with 11 blade scalpel. The selected hemodialysis catheter was tunneled in a retrograde fashion from the anterior chest wall to the venotomy incision. Serial dilation was  performed and then a peel-away sheath was placed. The catheter was then placed through the peel-away sheath with tips ultimately positioned within the superior aspect of the right atrium. Final catheter positioning was confirmed and documented with a spot radiographic image. The catheter aspirates and flushes normally. The catheter was flushed with appropriate volume heparin dwells. The catheter exit site was secured with a 0-Prolene retention suture. Gel-Foam slurry was infused into the soft tissue tract. The venotomy incision was closed Derma bond and sterile dressing. Dressings were applied at the chest wall. Patient tolerated the procedure well and remained hemodynamically stable throughout. No complications were encountered and no significant blood loss encountered. IMPRESSION: Status post right IJ tunneled hemodialysis catheter. Signed, Dulcy Fanny. Nadene Rubins, RPVI [facility] Radiology Electronically Signed   By: Corrie Mckusick D.O.   On: 12/20/2021 14:44   IR US Guide Vasc Access  Right  Result Date: 12/20/2021 INDICATION: 56 year old female referred for tunneled hemodialysis catheter EXAM: TUNNELED CENTRAL VENOUS HEMODIALYSIS CATHETER PLACEMENT WITH ULTRASOUND AND FLUOROSCOPIC GUIDANCE MEDICATIONS: 1 g vancomycin. The antibiotic was given in an appropriate time interval prior to skin puncture. ANESTHESIA/SEDATION: Moderate (conscious) sedation was employed during this procedure. A total of Versed 1.5 mg and Fentanyl 50 mcg was administered intravenously by the radiology nurse. Total intra-service moderate Sedation Time: 17 minutes. The patient's level of consciousness and vital signs were monitored continuously by radiology nursing throughout the procedure under my direct supervision. FLUOROSCOPY: Radiation Exposure Index (as provided by the fluoroscopic device): 8 mGy Kerma COMPLICATIONS: None PROCEDURE: Informed written consent was obtained from the patient after a discussion of the risks, benefits, and alternatives to treatment. Questions regarding the procedure were encouraged and answered. The right neck and chest were prepped with chlorhexidine in a sterile fashion, and a sterile drape was applied covering the operative field. Maximum barrier sterile technique with sterile gowns and gloves were used for the procedure. A timeout was performed prior to the initiation of the procedure. Ultrasound survey was performed. The right internal jugular vein was confirmed to be patent, with images stored and sent to PACS. Micropuncture kit was utilized to access the right internal jugular vein under direct, real-time ultrasound guidance after the overlying soft tissues were anesthetized with 1% lidocaine with epinephrine. Stab incision was made with 11 blade scalpel. Microwire was passed centrally. The microwire was then marked to measure appropriate internal catheter length. External tunneled length was estimated. A total tip to cuff length of 19 cm was selected. 035 guidewire was advanced to  the level of the IVC. Skin and subcutaneous tissues of chest wall below the clavicle were generously infiltrated with 1% lidocaine for local anesthesia. A small stab incision was made with 11 blade scalpel. The selected hemodialysis catheter was tunneled in a retrograde fashion from the anterior chest wall to the venotomy incision. Serial dilation was performed and then a peel-away sheath was placed. The catheter was then placed through the peel-away sheath with tips ultimately positioned within the superior aspect of the right atrium. Final catheter positioning was confirmed and documented with a spot radiographic image. The catheter aspirates and flushes normally. The catheter was flushed with appropriate volume heparin dwells. The catheter exit site was secured with a 0-Prolene retention suture. Gel-Foam slurry was infused into the soft tissue tract. The venotomy incision was closed Derma bond and sterile dressing. Dressings were applied at the chest wall. Patient tolerated the procedure well and remained hemodynamically stable throughout. No complications were  encountered and no significant blood loss encountered. IMPRESSION: Status post right IJ tunneled hemodialysis catheter. Signed, Dulcy Fanny. Nadene Rubins, RPVI [facility] Radiology Electronically Signed   By: Corrie Mckusick D.O.   On: 12/20/2021 14:44   NM Pulmonary Perfusion  Result Date: 12/18/2021 CLINICAL DATA:  Positive D-dimer.  Pulmonary embolism suspected. EXAM: NUCLEAR MEDICINE PERFUSION LUNG SCAN TECHNIQUE: Perfusion images were obtained in multiple projections after intravenous injection of radiopharmaceutical. Ventilation scans intentionally deferred if perfusion scan and chest x-ray adequate for interpretation during COVID 19 epidemic. RADIOPHARMACEUTICALS:  4.2 mCi Tc-56mMAA IV COMPARISON:  None available; correlation is made with AP chest 12/18/2021 and 12/16/2021 FINDINGS: There is  homogeneous distribution of the perfusion radiotracer throughout the bilateral lungs. No segmental filling defect is seen. IMPRESSION: Normal modified perfusion only V/Q scan.  PE absent. Electronically Signed   By: RYvonne KendallM.D.   On: 12/18/2021 14:04   DG CHEST PORT 1 VIEW  Result Date: 12/18/2021 CLINICAL DATA:  Asthma, CHF, COPD EXAM: PORTABLE CHEST 1 VIEW COMPARISON:  12/16/2021 FINDINGS: Mild cardiomegaly. Left chest port catheter. Both lungs are clear. The visualized skeletal structures are unremarkable. IMPRESSION: Mild cardiomegaly. No acute abnormality of the lungs. Electronically Signed   By: ADelanna AhmadiM.D.   On: 12/18/2021 10:51   [place]NAL  Result Date: 12/16/2021 CLINICAL DATA:  Acute renal failure on stage 4 chronic kidney disease. EXAM: RENAL / URINARY TRACT ULTRASOUND COMPLETE COMPARISON:  CT 08/29/2021 FINDINGS: Right Kidney: Renal measurements: 12.9 x 4 x 4.6 cm = volume: 126 mL. Normal parenchymal echogenicity. No hydronephrosis. No visualized stone or focal lesion. Left Kidney: Renal measurements: 12.8 x 6.6 x 5.5 cm = volume: 241 mL. Mildly increased parenchymal echogenicity. No hydronephrosis. No visualized stone or focal lesion. Bladder: Appears normal for degree of bladder distention. Other: None. IMPRESSION: 1. Mild increased left renal parenchymal echogenicity typical of chronic medical renal disease. 2. Otherwise unremarkable sonographic appearance of the kidneys and bladder. Electronically Signed   By: MKeith RakeM.D.   On: 12/16/2021 21:39   DG Chest Port 1 View  Result Date: 12/16/2021 CLINICAL DATA:  Questionable sepsis. EXAM: PORTABLE CHEST 1 VIEW COMPARISON:  08/29/2021. FINDINGS: Cardiac silhouette is normal in size. Normal mediastinal and hilar contours. Stable left anterior chest wall Port-A-Cath. Clear lungs.  No pleural effusion or pneumothorax. Skeletal structures are grossly intact. IMPRESSION: No active disease. Electronically Signed   By: DLajean ManesM.D.   On: 12/16/2021 11:08      Subjective: No nausea, no vomiting today, she tolerated dialysis well today, she reports some mild diarrhea  Discharge Exam: Vitals:   12/22/21 1245 12/22/21 1300  BP:    Pulse:  73  Resp: 15 (!) 22  Temp:    SpO2:  95%   Vitals:   12/22/21 1230 12/22/21 1234 12/22/21 1245 12/22/21 1300  BP: (!) 157/65 (!) 146/63    Pulse: 76 76  73  Resp: 13 14 15  (!) 22  Temp:  97.6 F (36.4 C)    TempSrc:  Axillary    SpO2: 97% 96%  95%  Weight:      Height:        General: Pt is alert, awake, not in acute distress Cardiovascular: RRR, S1/S2 +, no rubs, no gallops Respiratory: CTA bilaterally, no wheezing, no rhonchi Abdominal: Soft, NT, ND, bowel sounds + Extremities: + edema, no cyanosis    The results of significant diagnostics from this hospitalization (  including imaging, microbiology, ancillary and laboratory) are listed below for reference.     Microbiology: Recent Results (from the past 240 hour(s))  Resp Panel by RT-PCR (Flu A&B, Covid) Anterior Nasal Swab     Status: None   Collection Time: 12/16/21  2:17 PM   Specimen: Anterior Nasal Swab  Result Value Ref Range Status   SARS Coronavirus 2 by RT PCR NEGATIVE NEGATIVE Final    Comment: (NOTE) SARS-CoV-2 target nucleic acids are NOT DETECTED.  The SARS-CoV-2 RNA is generally detectable in upper respiratory specimens during the acute phase of infection. The lowest concentration of SARS-CoV-2 viral copies this assay can detect is 138 copies/mL. A negative result does not preclude SARS-Cov-2 infection and should not be used as the sole basis for treatment or other patient management decisions. A negative result may occur with  improper specimen collection/handling, submission of specimen other than nasopharyngeal swab, presence of viral mutation(s) within the areas targeted by this assay, and inadequate number of viral copies(<138 copies/mL). A negative result must be combined  with clinical observations, patient history, and epidemiological information. The expected result is Negative.  Fact Sheet for Patients:  EntrepreneurPulse.com.au  Fact Sheet for Healthcare Providers:  IncredibleEmployment.be  This test is no t yet approved or cleared by the [place] FDA and  has been authorized for detection and/or diagnosis of SARS-CoV-2 by FDA under an Emergency Use Authorization (EUA). This EUA will remain  in effect (meaning this test can be used) for the duration of the COVID-19 declaration under Section 564(b)(1) of the Act, 21 U.S.C.section 360bbb-3(b)(1), unless the authorization is terminated  or revoked sooner.       Influenza A by PCR NEGATIVE NEGATIVE Final   Influenza B by PCR NEGATIVE NEGATIVE Final    Comment: (NOTE) The Xpert Xpress SARS-CoV-2/FLU/RSV plus assay is intended as an aid in the diagnosis of influenza from Nasopharyngeal swab specimens and should not be used as a sole basis for treatment. Nasal washings and aspirates are unacceptable for Xpert Xpress SARS-CoV-2/FLU/RSV testing.  Fact Sheet for Patients: EntrepreneurPulse.com.au  Fact Sheet for Healthcare Providers: IncredibleEmployment.be  This test is not yet approved or cleared by the [place] FDA and has been authorized for detection and/or diagnosis of SARS-CoV-2 by FDA under an Emergency Use Authorization (EUA). This EUA will remain in effect (meaning this test can be used) for the duration of the COVID-19 declaration under Section 564(b)(1) of the Act, 21 U.S.C. section 360bbb-3(b)(1), unless the authorization is terminated or revoked.  Performed at [facility], [address], [place], [place] [address]      Labs: BNP (last 3 results) No results for input(s): "BNP" in the last 8760 hours. Basic Metabolic Panel: Recent Labs  Lab 12/16/21 1513 12/17/21 0315 12/18/21 0305  12/19/21 0409 12/20/21 0500 12/21/21 0304 12/22/21 0341  NA  --    < > 132* 133* 130* 133* 134*  K  --    < > 5.0 4.9 4.7 4.4 3.3*  CL  --    < > 104 100 99 102 101  CO2  --    < > 16* 20* 20* 20* 23  GLUCOSE  --    < > 79 179* 115* 157* 167*  BUN  --    < > 80* 81* 84* 79* 53*  CREATININE  --    < > 6.51* 6.84* 6.84* 6.47* 4.80*  CALCIUM  --    < > 7.3* 7.2* 7.3* 7.4* 7.3*  MG 1.8  --   --   --   --   --   --  PHOS 8.7*  --  8.3*  --  8.4* 7.8* 5.5*   < > = values in this interval not displayed.   Liver Function Tests: Recent Labs  Lab 12/16/21 1131 12/17/21 0315 12/18/21 0305 12/20/21 0500 12/21/21 0304 12/22/21 0341  AST 27 33  --   --   --   --   ALT 34 32  --   --   --   --   ALKPHOS 149* 129*  --   --   --   --   BILITOT 0.6 0.4  --   --   --   --   PROT 6.7 5.8*  --   --   --   --   ALBUMIN 3.0* 2.7* 2.5* 2.4* 2.4* 2.4*   No results for input(s): "LIPASE", "AMYLASE" in the last 168 hours. No results for input(s): "AMMONIA" in the last 168 hours. CBC: Recent Labs  Lab 12/16/21 1131 12/17/21 0315 12/18/21 0305 12/19/21 0409 12/20/21 0500 12/21/21 0304 12/22/21 0341  WBC 11.4*   < > 10.9* 10.7* 9.4 11.9* 10.8*  NEUTROABS 9.9*  --   --   --   --   --   --   HGB 8.4*   < > 7.5* 7.6* 7.4* 7.3* 7.0*  HCT 27.1*   < > 24.0* 23.6* 22.5* 23.0* 21.1*  MCV 98.5   < > 96.4 94.8 93.0 95.8 94.2  PLT 203   < > 193 197 212 192 168   < > = values in this interval not displayed.   Cardiac Enzymes: Recent Labs  Lab 12/16/21 1424  CKTOTAL 166   BNP: Invalid input(s): "POCBNP" CBG: Recent Labs  Lab 12/21/21 1151 12/21/21 2109 12/22/21 0030 12/22/21 0434 12/22/21 1125  GLUCAP 215* 201* 192* 176* 95   D-Dimer No results for input(s): "DDIMER" in the last 72 hours. Hgb A1c No results for input(s): "HGBA1C" in the last 72 hours. Lipid Profile No results for input(s): "CHOL", "HDL", "LDLCALC", "TRIG", "CHOLHDL", "LDLDIRECT" in the last 72 hours. Thyroid  function studies No results for input(s): "TSH", "T4TOTAL", "T3FREE", "THYROIDAB" in the last 72 hours.  Invalid input(s): "FREET3" Anemia work up No results for input(s): "VITAMINB12", "FOLATE", "FERRITIN", "TIBC", "IRON", "RETICCTPCT" in the last 72 hours. Urinalysis    Component Value Date/Time   COLORURINE YELLOW 12/18/2021 0700   APPEARANCEUR HAZY (A) 12/18/2021 0700   LABSPEC 1.013 12/18/2021 0700   PHURINE 5.0 12/18/2021 0700   GLUCOSEU NEGATIVE 12/18/2021 0700   HGBUR NEGATIVE 12/18/2021 0700   BILIRUBINUR NEGATIVE 12/18/2021 0700   KETONESUR NEGATIVE 12/18/2021 0700   PROTEINUR 100 (A) 12/18/2021 0700   NITRITE NEGATIVE 12/18/2021 0700   LEUKOCYTESUR SMALL (A) 12/18/2021 0700   Sepsis Labs Recent Labs  Lab 12/19/21 0409 12/20/21 0500 12/21/21 0304 12/22/21 0341  WBC 10.7* 9.4 11.9* 10.8*   Microbiology Recent Results (from the past 240 hour(s))  Resp Panel by RT-PCR (Flu A&B, Covid) Anterior Nasal Swab     Status: None   Collection Time: 12/16/21  2:17 PM   Specimen: Anterior Nasal Swab  Result Value Ref Range Status   SARS Coronavirus 2 by RT PCR NEGATIVE NEGATIVE Final    Comment: (NOTE) SARS-CoV-2 target nucleic acids are NOT DETECTED.  The SARS-CoV-2 RNA is generally detectable in upper respiratory specimens during the acute phase of infection. The lowest concentration of SARS-CoV-2 viral copies this assay can detect is 138 copies/mL. A negative result does not preclude SARS-Cov-2 infection and should not be used  as the sole basis for treatment or other patient management decisions. A negative result may occur with  improper specimen collection/handling, submission of specimen other than nasopharyngeal swab, presence of viral mutation(s) within the areas targeted by this assay, and inadequate number of viral copies(<138 copies/mL). A negative result must be combined with clinical observations, patient history, and epidemiological information. The  expected result is Negative.  Fact Sheet for Patients:  EntrepreneurPulse.com.au  Fact Sheet for Healthcare Providers:  IncredibleEmployment.be  This test is no t yet approved or cleared by the [place] FDA and  has been authorized for detection and/or diagnosis of SARS-CoV-2 by FDA under an Emergency Use Authorization (EUA). This EUA will remain  in effect (meaning this test can be used) for the duration of the COVID-19 declaration under Section 564(b)(1) of the Act, 21 U.S.C.section 360bbb-3(b)(1), unless the authorization is terminated  or revoked sooner.       Influenza A by PCR NEGATIVE NEGATIVE Final   Influenza B by PCR NEGATIVE NEGATIVE Final    Comment: (NOTE) The Xpert Xpress SARS-CoV-2/FLU/RSV plus assay is intended as an aid in the diagnosis of influenza from Nasopharyngeal swab specimens and should not be used as a sole basis for treatment. Nasal washings and aspirates are unacceptable for Xpert Xpress SARS-CoV-2/FLU/RSV testing.  Fact Sheet for Patients: EntrepreneurPulse.com.au  Fact Sheet for Healthcare Providers: IncredibleEmployment.be  This test is not yet approved or cleared by the [place] FDA and has been authorized for detection and/or diagnosis of SARS-CoV-2 by FDA under an Emergency Use Authorization (EUA). This EUA will remain in effect (meaning this test can be used) for the duration of the COVID-19 declaration under Section 564(b)(1) of the Act, 21 U.S.C. section 360bbb-3(b)(1), unless the authorization is terminated or revoked.  Performed at [facility], [address], [place], [place] [address]      Time coordinating discharge: Over 30 minutes  SIGNED:   Phillips Climes, MD  [facility] 12/22/2021, 1:36 PM Pager   If 7PM-7AM, please contact night-coverage [email or web address]

## 2021-12-23 LAB — TYPE AND SCREEN
ABO/RH(D): O POS
Antibody Screen: NEGATIVE
Unit division: 0

## 2021-12-23 LAB — BPAM RBC
Blood Product Expiration Date: 202310062359
ISSUE DATE / TIME: 202309091210
Unit Type and Rh: 5100

## 2021-12-24 ENCOUNTER — Telehealth: Payer: Self-pay

## 2021-12-24 ENCOUNTER — Telehealth: Payer: Self-pay | Admitting: Internal Medicine

## 2021-12-24 DIAGNOSIS — E1140 Type 2 diabetes mellitus with diabetic neuropathy, unspecified: Secondary | ICD-10-CM | POA: Diagnosis not present

## 2021-12-24 DIAGNOSIS — N184 Chronic kidney disease, stage 4 (severe): Secondary | ICD-10-CM | POA: Diagnosis not present

## 2021-12-24 DIAGNOSIS — E1122 Type 2 diabetes mellitus with diabetic chronic kidney disease: Secondary | ICD-10-CM | POA: Diagnosis not present

## 2021-12-24 DIAGNOSIS — L97421 Non-pressure chronic ulcer of left heel and midfoot limited to breakdown of skin: Secondary | ICD-10-CM | POA: Diagnosis not present

## 2021-12-24 DIAGNOSIS — D649 Anemia, unspecified: Secondary | ICD-10-CM | POA: Diagnosis not present

## 2021-12-24 DIAGNOSIS — I509 Heart failure, unspecified: Secondary | ICD-10-CM | POA: Diagnosis not present

## 2021-12-24 DIAGNOSIS — N179 Acute kidney failure, unspecified: Secondary | ICD-10-CM | POA: Diagnosis not present

## 2021-12-24 DIAGNOSIS — I130 Hypertensive heart and chronic kidney disease with heart failure and stage 1 through stage 4 chronic kidney disease, or unspecified chronic kidney disease: Secondary | ICD-10-CM | POA: Diagnosis not present

## 2021-12-24 DIAGNOSIS — E08621 Diabetes mellitus due to underlying condition with foot ulcer: Secondary | ICD-10-CM | POA: Diagnosis not present

## 2021-12-24 DIAGNOSIS — J449 Chronic obstructive pulmonary disease, unspecified: Secondary | ICD-10-CM | POA: Diagnosis not present

## 2021-12-24 DIAGNOSIS — Z992 Dependence on renal dialysis: Secondary | ICD-10-CM | POA: Diagnosis not present

## 2021-12-24 NOTE — Progress Notes (Addendum)
Late Entry Note:  Pt was d/c this weekend. Will contact [place] to advise clinic of pt's d/c this weekend. D/C summary and last renal note faxed to clinic for continuation of care.  Melven Sartorius Renal Navigator [phone]  Addendum at 9:11 am: Spoke to [place] at [facility]. Clinic aware pt will start at clinic tomorrow.

## 2021-12-24 NOTE — Telephone Encounter (Signed)
Transition Care Management Follow-up Telephone Call Date of discharge and from where: 12/22/2021 from [facility] How have you been since you were released from the hospital? Better, starting dialysis tomorrow 3x a week Any questions or concerns? No  Items Reviewed: Did the pt receive and understand the discharge instructions provided? Yes  Medications obtained and verified? Yes  Other?  N/a Any new allergies since your discharge? No  Dietary orders reviewed? Yes Do you have support at home? Yes   Home Care and Equipment/Supplies: Were home health services ordered? yes If so, what is the name of the agency? Couldn't remember  Has the agency set up a time to come to the patient's home? yes Were any new equipment or medical supplies ordered?  Yes: depends on PT What is the name of the medical supply agency? N/A Were you able to get the supplies/equipment? not applicable Do you have any questions related to the use of the equipment or supplies? No  Functional Questionnaire: (I = Independent and D = Dependent) ADLs: I  Bathing/Dressing- i  Meal Prep- i  Eating- i  Maintaining continence- i  Transferring/Ambulation- i  Managing Meds- i  Follow up appointments reviewed:  PCP Hospital f/u appt confirmed? Yes  Scheduled to see Dr. Doren Custard on 12/28/2021 @ 230. [facility] f/u appt confirmed? No   Are transportation arrangements needed? No  If their condition worsens, is the pt aware to call PCP or go to the Emergency Dept.? Yes Was the patient provided with contact information for the PCP's office or ED? Yes Was to pt encouraged to call back with questions or concerns? Yes

## 2021-12-24 NOTE — Telephone Encounter (Signed)
TOC call made 

## 2021-12-24 NOTE — Telephone Encounter (Signed)
Patient DC from hospital on 9/9  TOC appt made TOC call needed

## 2021-12-25 DIAGNOSIS — E119 Type 2 diabetes mellitus without complications: Secondary | ICD-10-CM | POA: Diagnosis not present

## 2021-12-25 DIAGNOSIS — Z1159 Encounter for screening for other viral diseases: Secondary | ICD-10-CM | POA: Diagnosis not present

## 2021-12-25 DIAGNOSIS — Z13220 Encounter for screening for lipoid disorders: Secondary | ICD-10-CM | POA: Diagnosis not present

## 2021-12-25 DIAGNOSIS — Z992 Dependence on renal dialysis: Secondary | ICD-10-CM | POA: Diagnosis not present

## 2021-12-25 DIAGNOSIS — Z794 Long term (current) use of insulin: Secondary | ICD-10-CM | POA: Diagnosis not present

## 2021-12-25 DIAGNOSIS — E1122 Type 2 diabetes mellitus with diabetic chronic kidney disease: Secondary | ICD-10-CM | POA: Diagnosis not present

## 2021-12-25 DIAGNOSIS — E1165 Type 2 diabetes mellitus with hyperglycemia: Secondary | ICD-10-CM | POA: Diagnosis not present

## 2021-12-25 DIAGNOSIS — N186 End stage renal disease: Secondary | ICD-10-CM | POA: Diagnosis not present

## 2021-12-26 ENCOUNTER — Telehealth: Payer: Self-pay | Admitting: Family

## 2021-12-26 DIAGNOSIS — N179 Acute kidney failure, unspecified: Secondary | ICD-10-CM | POA: Diagnosis not present

## 2021-12-26 NOTE — Telephone Encounter (Signed)
[facility]  2x for 3 weeks  Cb [phone] Tracy Walsh)

## 2021-12-27 NOTE — Telephone Encounter (Signed)
Called and lm on vm to advise ok for orders as requested below. To call with any other questions.

## 2021-12-28 ENCOUNTER — Ambulatory Visit (INDEPENDENT_AMBULATORY_CARE_PROVIDER_SITE_OTHER): Payer: 59 | Admitting: Internal Medicine

## 2021-12-28 ENCOUNTER — Encounter: Payer: Self-pay | Admitting: Internal Medicine

## 2021-12-28 VITALS — BP 158/84 | HR 81 | Ht 68.0 in | Wt 212.4 lb

## 2021-12-28 DIAGNOSIS — J449 Chronic obstructive pulmonary disease, unspecified: Secondary | ICD-10-CM | POA: Diagnosis not present

## 2021-12-28 DIAGNOSIS — E08621 Diabetes mellitus due to underlying condition with foot ulcer: Secondary | ICD-10-CM | POA: Diagnosis not present

## 2021-12-28 DIAGNOSIS — N184 Chronic kidney disease, stage 4 (severe): Secondary | ICD-10-CM | POA: Diagnosis not present

## 2021-12-28 DIAGNOSIS — L97409 Non-pressure chronic ulcer of unspecified heel and midfoot with unspecified severity: Secondary | ICD-10-CM | POA: Diagnosis not present

## 2021-12-28 DIAGNOSIS — I10 Essential (primary) hypertension: Secondary | ICD-10-CM

## 2021-12-28 DIAGNOSIS — N179 Acute kidney failure, unspecified: Secondary | ICD-10-CM

## 2021-12-28 DIAGNOSIS — D649 Anemia, unspecified: Secondary | ICD-10-CM | POA: Diagnosis not present

## 2021-12-28 DIAGNOSIS — E1165 Type 2 diabetes mellitus with hyperglycemia: Secondary | ICD-10-CM

## 2021-12-28 DIAGNOSIS — R5381 Other malaise: Secondary | ICD-10-CM | POA: Diagnosis not present

## 2021-12-28 DIAGNOSIS — R112 Nausea with vomiting, unspecified: Secondary | ICD-10-CM

## 2021-12-28 DIAGNOSIS — E118 Type 2 diabetes mellitus with unspecified complications: Secondary | ICD-10-CM | POA: Diagnosis not present

## 2021-12-28 DIAGNOSIS — F418 Other specified anxiety disorders: Secondary | ICD-10-CM

## 2021-12-28 DIAGNOSIS — R69 Illness, unspecified: Secondary | ICD-10-CM | POA: Diagnosis not present

## 2021-12-28 MED ORDER — ALBUTEROL SULFATE (2.5 MG/3ML) 0.083% IN NEBU: 2.5000 mg | INHALATION_SOLUTION | Freq: Four times a day (QID) | RESPIRATORY_TRACT | 0 refills | Status: AC | PRN

## 2021-12-28 MED ORDER — PROCHLORPERAZINE MALEATE 5 MG PO TABS: 5.0000 mg | ORAL_TABLET | Freq: Four times a day (QID) | ORAL | 0 refills | Status: DC | PRN

## 2021-12-28 NOTE — Patient Instructions (Signed)
It was a pleasure to see you today.  Thank you for giving [place] the opportunity to be involved in your care.  Below is a brief recap of your visit and next steps.  We will plan to see you again in 4 weeks  Summary We will check labs today I have ordered a commode chair, diabetic shoes, and albuterol  Next steps Follow up in 4 weeks I will notify you of labs

## 2021-12-28 NOTE — Progress Notes (Unsigned)
Transition of Care Visit  Subjective   Patient ID: Tracy Walsh, female    DOB: 05-20-1965  Age: 56 y.o. MRN: [id number]  Chief Complaint  Patient presents with   Transitions Of Care    D/c 9/9   Tracy Walsh is a 56 year old woman seen today for a transition of care appointment.  Last seen by me 12/07/2021 to establish care.  She has a past medical history significant for CAD, systolic HF, HTN, IDA, CKD, T2DM, CIDP, depression, diabetic foot ulcers, and current tobacco use.  Tracy Walsh was admitted initially to [facility] on 9/3 in the setting of hypoglycemia with severe metabolic acidosis, hyperkalemia, acute on chronic renal failure.  She was then transferred to [facility] for further evaluation and management.  She was placed on a D5 infusion with bicarbonate due to severe metabolic acidosis, which resolved.  She did not have any further episodes of hypoglycemia while on the D5 infusion.  Her renal function continue to worsen, ultimately resulting in placement of a tunneled dialysis catheter and initiation of hemodialysis on 9/7.  She has started a TTS dialysis schedule in the outpatient setting.  Cymbalta and BuSpar doses were redcued and the recommendation on discharge was to continue gradual tapering.  Oral diabetes medications were discontinued upon discharge as well.  Today ***  Past Medical History:  Diagnosis Date   Anemia    Anxiety    Asthma    Bipolar disorder ([place]) 2018   type 2   CHF (congestive heart failure) (HCC)    CIDP (chronic inflammatory demyelinating polyneuropathy) (HCC)    CKD (chronic kidney disease)    COPD (chronic obstructive pulmonary disease) ([place])    Depression    Diabetes mellitus without complication (HCC)    type 2   Diabetic foot ulcers (HCC)    Fibromyalgia    GERD (gastroesophageal reflux disease)    Hepatitis    Hep as a child - patient can give blood   History of blood transfusion 08/2021   1 unit per patient   History of  esophagogastroduodenoscopy (EGD) 2018   History of kidney stones    passed stones   HLD (hyperlipidemia)    Hypertension    Migraines    Myocardial infarction (HCC)    x 2   Neuropathy    legs and feet - bilateral   Past Surgical History:  Procedure Laterality Date   ABDOMINAL HYSTERECTOMY     APPENDECTOMY     BILATERAL NASAL FRACTURE CLOSED REDUCTION  10/2020   CHOLECYSTECTOMY     COLONOSCOPY     CORONARY ANGIOPLASTY WITH STENT PLACEMENT  01/15/2021   FOOT SURGERY Right    I & D   I & D EXTREMITY Left 09/28/2021   Procedure: LEFT HEEL DEBRIDEMENT AND TISSUE GRAFT;  Surgeon: Newt Minion, MD;  Location: [facility];  Service: Orthopedics;  Laterality: Left;   IR FLUORO GUIDE CV LINE RIGHT  12/20/2021   IR US GUIDE VASC ACCESS RIGHT  12/20/2021   SHOULDER SURGERY Bilateral    SPINAL FUSION     UPPER GI ENDOSCOPY     Social History   Tobacco Use   Smoking status: Every Day    Packs/day: 0.50    Types: Cigarettes   Smokeless tobacco: Never  Vaping Use   Vaping Use: Never used  Substance Use Topics   Alcohol use: Never   Drug use: Never   No family history on file. Allergies  Allergen Reactions  Amoxicillin Anaphylaxis   Clarithromycin Anaphylaxis, Diarrhea, Itching, Nausea And Vomiting and Rash   Codeine Itching   Hydrocodone Itching   Moxifloxacin Anaphylaxis, Anxiety, Hives, Itching, Nausea And Vomiting, Palpitations and Shortness Of Breath   Penicillins Anaphylaxis   Sulfa Antibiotics Anaphylaxis   Nitrofurantoin Nausea And Vomiting   Crab (Diagnostic) Hives   Latex Hives, Itching, Rash and Swelling      ROS    Objective:     BP (!) 158/84 (BP Location: Right Arm, Cuff Size: Normal)   Pulse 81   Ht 5\' 8"  (1.727 m)   Wt 212 lb 6.4 oz (96.3 kg)   SpO2 97%   BMI 32.30 kg/m  {Vitals History (Optional):23777}  Physical Exam   No results found for any visits on 12/28/21.  {Labs (Optional):23779}  The ASCVD Risk score (Arnett DK, et al., 2019) failed  to calculate for the following reasons:   Cannot find a previous HDL lab    Assessment & Plan:   Problem List Items Addressed This Visit   None   No follow-ups on file.    [name], MD

## 2021-12-29 LAB — CBC WITH DIFFERENTIAL/PLATELET
Basophils Absolute: 0.1 10*3/uL (ref 0.0–0.2)
Basos: 1 %
EOS (ABSOLUTE): 0.4 10*3/uL (ref 0.0–0.4)
Eos: 4 %
Hematocrit: 29.7 % — ABNORMAL LOW (ref 34.0–46.6)
Hemoglobin: 9.4 g/dL — ABNORMAL LOW (ref 11.1–15.9)
Immature Grans (Abs): 0.1 10*3/uL (ref 0.0–0.1)
Immature Granulocytes: 1 %
Lymphocytes Absolute: 0.5 10*3/uL — ABNORMAL LOW (ref 0.7–3.1)
Lymphs: 5 %
MCH: 30.4 pg (ref 26.6–33.0)
MCHC: 31.6 g/dL (ref 31.5–35.7)
MCV: 96 fL (ref 79–97)
Monocytes Absolute: 1.3 10*3/uL — ABNORMAL HIGH (ref 0.1–0.9)
Monocytes: 14 %
Neutrophils Absolute: 6.8 10*3/uL (ref 1.4–7.0)
Neutrophils: 75 %
Platelets: 172 10*3/uL (ref 150–450)
RBC: 3.09 x10E6/uL — ABNORMAL LOW (ref 3.77–5.28)
RDW: 14.3 % (ref 11.7–15.4)
WBC: 9 10*3/uL (ref 3.4–10.8)

## 2021-12-29 LAB — CMP14+EGFR
ALT: 21 IU/L (ref 0–32)
AST: 20 IU/L (ref 0–40)
Albumin/Globulin Ratio: 1 — ABNORMAL LOW (ref 1.2–2.2)
Albumin: 3.3 g/dL — ABNORMAL LOW (ref 3.8–4.9)
Alkaline Phosphatase: 146 IU/L — ABNORMAL HIGH (ref 44–121)
BUN/Creatinine Ratio: 6 — ABNORMAL LOW (ref 9–23)
BUN: 18 mg/dL (ref 6–24)
Bilirubin Total: <0.2 mg/dL (ref 0.0–1.2)
CO2: 28 mmol/L (ref 20–29)
Calcium: 8.2 mg/dL — ABNORMAL LOW (ref 8.7–10.2)
Chloride: 97 mmol/L (ref 96–106)
Creatinine, Ser: 2.93 mg/dL — ABNORMAL HIGH (ref 0.57–1.00)
Globulin, Total: 3.2 g/dL (ref 1.5–4.5)
Glucose: 293 mg/dL — ABNORMAL HIGH (ref 70–99)
Potassium: 3.5 mmol/L (ref 3.5–5.2)
Sodium: 139 mmol/L (ref 134–144)
Total Protein: 6.5 g/dL (ref 6.0–8.5)
eGFR: 18 mL/min/{1.73_m2} — ABNORMAL LOW (ref >59)

## 2021-12-29 LAB — VITAMIN D 25 HYDROXY (VIT D DEFICIENCY, FRACTURES): Vit D, 25-Hydroxy: 28.2 ng/mL — ABNORMAL LOW (ref 30.0–100.0)

## 2021-12-31 ENCOUNTER — Inpatient Hospital Stay: Payer: 59

## 2022-01-01 ENCOUNTER — Telehealth (INDEPENDENT_AMBULATORY_CARE_PROVIDER_SITE_OTHER): Payer: Self-pay | Admitting: *Deleted

## 2022-01-01 ENCOUNTER — Other Ambulatory Visit (INDEPENDENT_AMBULATORY_CARE_PROVIDER_SITE_OTHER): Payer: Self-pay | Admitting: Gastroenterology

## 2022-01-01 MED ORDER — DICYCLOMINE HCL 10 MG PO CAPS: 10.0000 mg | ORAL_CAPSULE | Freq: Two times a day (BID) | ORAL | 1 refills | Status: DC | PRN

## 2022-01-01 NOTE — Telephone Encounter (Signed)
Patient seen by Dr. Jenetta Downer on 11/26/21 for diarrhea. Reports she is about the same as when seen. Has average of 6 epidsodes of diarrhea per day. No blood in stool, no fever, has abdominal cramping. Taking imodium one bid, benefiber and daiy probiotic. Had accident at dialysis today and had to stop dialysis due to accident. Asking if she can take something for diarrhea on the days she has dialysis.

## 2022-01-01 NOTE — Telephone Encounter (Signed)
Discussed with patient per [facility] - We can try adding in bentyl 10mg  up to BID PRN, however, all the things she is currently doing are what I would usually recommend so she can continue with current regimen and add this in.   Patient verbalized understanding.

## 2022-01-02 ENCOUNTER — Ambulatory Visit: Payer: 59 | Admitting: Family

## 2022-01-02 ENCOUNTER — Encounter: Payer: Self-pay | Admitting: Internal Medicine

## 2022-01-02 DIAGNOSIS — R5381 Other malaise: Secondary | ICD-10-CM | POA: Insufficient documentation

## 2022-01-02 DIAGNOSIS — J449 Chronic obstructive pulmonary disease, unspecified: Secondary | ICD-10-CM | POA: Insufficient documentation

## 2022-01-02 NOTE — Assessment & Plan Note (Signed)
Recent admission for severe hypoglycemia.  Glimepiride discontinued.  She is not currently on insulin or any other diabetes medications. -Repeat labs today -She will need close outpatient follow-up for monitoring of blood glucose levels

## 2022-01-02 NOTE — Assessment & Plan Note (Signed)
Now ESRD and has started dialysis TTS.  She was admitted to [facility] on 9/3 after presenting with severe hyperglycemia with metabolic acidosis and hyperkalemia.  Placed on bicarbonate infusion due to metabolic acidosis.  Tunneled dialysis catheter placed.  She has establish care with [facility] dialysis.  -Nephrology follow-up scheduled for early November

## 2022-01-02 NOTE — Assessment & Plan Note (Signed)
PHQ-9 score elevated.  She attributes this to her recent medical events.  BuSpar and Cymbalta have been renally dosed.  She is also taking Abilify.  Previously referred to psychiatry.  Denies SI/HI.  No additional changes today.

## 2022-01-02 NOTE — Assessment & Plan Note (Signed)
She is largely wheelchair-bound due to chronic medical conditions.  She has difficulty transferring from her bed to the wheelchair and from a wheelchair to the commode or shower.  Today she has requested a commode chair and diabetic shoes.  These orders have been placed.

## 2022-01-02 NOTE — Assessment & Plan Note (Addendum)
History of COPD.  She has an albuterol inhaler that she uses as needed.  She endorses URI symptoms today and requests a refill of albuterol. -Albuterol refilled

## 2022-01-02 NOTE — Assessment & Plan Note (Signed)
Documented hypotension during recent mission.  She is currently just taking Imdur.  Her other antihypertensive medications were stopped during admission.  BP today is elevated. -No changes for now.  We will plan for close follow-up and gradually add back necessary antihypertensive medications

## 2022-01-02 NOTE — Assessment & Plan Note (Signed)
She is experiencing persistent nausea with vomiting.  Discharged with Zofran.  Requesting additional antiemetic today. -I have prescribed Compazine for as needed use for nausea relief

## 2022-01-03 ENCOUNTER — Inpatient Hospital Stay: Payer: 59

## 2022-01-03 NOTE — Telephone Encounter (Signed)
error 

## 2022-01-07 ENCOUNTER — Telehealth: Payer: Self-pay | Admitting: Family

## 2022-01-07 DIAGNOSIS — N179 Acute kidney failure, unspecified: Secondary | ICD-10-CM | POA: Diagnosis not present

## 2022-01-07 NOTE — Telephone Encounter (Signed)
Tracy Walsh (PT) called from [place] called with an FYI. Tracy Walsh states pt has [facility] listed as primary doctor. Pt's blood pressures was elevated. Reading was 183/ 96. Tracy Walsh phone number is [phone].

## 2022-01-07 NOTE — Telephone Encounter (Signed)
Mark informed that Junie Panning is not her PCP. Given him Dr. Doren Custard [place]'s name as this is what we have on her chart as PCP.

## 2022-01-09 ENCOUNTER — Ambulatory Visit: Payer: 59 | Admitting: Vascular Surgery

## 2022-01-09 ENCOUNTER — Encounter: Payer: Self-pay | Admitting: Vascular Surgery

## 2022-01-09 ENCOUNTER — Telehealth: Payer: Self-pay

## 2022-01-09 ENCOUNTER — Other Ambulatory Visit: Payer: Self-pay

## 2022-01-09 VITALS — BP 179/106 | HR 69 | Temp 97.9°F | Ht 68.0 in | Wt 210.4 lb

## 2022-01-09 DIAGNOSIS — N186 End stage renal disease: Secondary | ICD-10-CM | POA: Diagnosis not present

## 2022-01-09 DIAGNOSIS — Z992 Dependence on renal dialysis: Secondary | ICD-10-CM

## 2022-01-09 DIAGNOSIS — N179 Acute kidney failure, unspecified: Secondary | ICD-10-CM | POA: Diagnosis not present

## 2022-01-09 NOTE — Telephone Encounter (Signed)
No need to call Elta Guadeloupe back, he just called and confirm I sent this message back to Dr Doren Custard nurse that we did get the message off of our answering machine

## 2022-01-09 NOTE — Telephone Encounter (Signed)
Mark from [facility] called said patient blood pressure elevated 185/88. Ask once Dr Doren Custard received this message to give him a call back to let him know he did get this message. Call back # [phone].

## 2022-01-09 NOTE — H&P (View-Only) (Signed)
[facility] [place]  Patient name: Tracy Walsh MRN: [id number] DOB: 1965/11/10 Sex: female  REASON FOR CONSULT: Discuss access for hemodialysis  HPI: Tracy Walsh is a 56 y.o. female, who is here today for discussion of access for hemodialysis.  She has had progressive renal insufficiency and is now end-stage renal disease.  She is having dialysis via a right IJ tunnel catheter.  She has dialysis at [facility].  She is right-handed.  She does have a history of left subclavian vein Port-A-Cath placement over 10 years ago.  This was for infusion therapy related to her chronic inflammatory demyelinating polyneuropathy.  This has not been used for several years due to insurance reasons but she reports that this has been access for blood draws.  Does not sense any swelling in her left versus right arm.  Past Medical History:  Diagnosis Date   Anemia    Anxiety    Asthma    Bipolar disorder ([place]) 2018   type 2   CHF (congestive heart failure) (HCC)    CIDP (chronic inflammatory demyelinating polyneuropathy) (HCC)    CKD (chronic kidney disease)    COPD (chronic obstructive pulmonary disease) (HCC)    Depression    Diabetes mellitus without complication (HCC)    type 2   Diabetic foot ulcers (HCC)    Fibromyalgia    GERD (gastroesophageal reflux disease)    Hepatitis    Hep as a child - patient can give blood   History of blood transfusion 08/2021   1 unit per patient   History of esophagogastroduodenoscopy (EGD) 2018   History of kidney stones    passed stones   HLD (hyperlipidemia)    Hypertension    Migraines    Myocardial infarction (HCC)    x 2   Neuropathy    legs and feet - bilateral    History reviewed. No pertinent family history.  SOCIAL HISTORY: Social History   Socioeconomic History   Marital status: Married    Spouse name: Not on file   Number of children: Not on file   Years of education: Not on  file   Highest education level: Not on file  Occupational History   Not on file  Tobacco Use   Smoking status: Every Day    Packs/day: 0.50    Types: Cigarettes   Smokeless tobacco: Never  Vaping Use   Vaping Use: Never used  Substance and Sexual Activity   Alcohol use: Never   Drug use: Never   Sexual activity: Yes    Birth control/protection: Surgical    Comment: Hysterectomy  Other Topics Concern   Not on file  Social History Narrative   Not on file   Social Determinants of Health   Financial Resource Strain: Not on file  Food Insecurity: Not on file  Transportation Needs: Not on file  Physical Activity: Not on file  Stress: Not on file  Social Connections: Not on file  Intimate Partner Violence: Not on file    Allergies  Allergen Reactions   Amoxicillin Anaphylaxis   Clarithromycin Anaphylaxis, Diarrhea, Itching, Nausea And Vomiting and Rash   Codeine Itching   Hydrocodone Itching   Moxifloxacin Anaphylaxis, Anxiety, Hives, Itching, Nausea And Vomiting, Palpitations and Shortness Of Breath   Penicillins Anaphylaxis   Sulfa Antibiotics Anaphylaxis   Nitrofurantoin Nausea And Vomiting   Crab (Diagnostic) Hives   Latex Hives, Itching, Rash and Swelling    Current Outpatient Medications  Medication Sig  Dispense Refill   albuterol (PROVENTIL) (2.5 MG/3ML) 0.083% nebulizer solution Take 3 mLs (2.5 mg total) by nebulization every 6 (six) hours as needed for wheezing or shortness of breath. 75 mL 0   ARIPiprazole (ABILIFY) 5 MG tablet Take 5 mg by mouth daily.     Ascorbic Acid (VITAMIN C) 1000 MG tablet Take 1,000 mg by mouth daily.     aspirin EC 81 MG tablet Take 81 mg by mouth daily. Swallow whole.     busPIRone (BUSPAR) 7.5 MG tablet Take 1 tablet (7.5 mg total) by mouth 2 (two) times daily. 60 tablet 0   cyanocobalamin (,VITAMIN B-12,) 1000 MCG/ML injection Inject 1,000 mcg into the muscle every 30 (thirty) days.     dicyclomine (BENTYL) 10 MG capsule Take 1  capsule (10 mg total) by mouth 2 (two) times daily as needed for spasms. 60 capsule 1   DULoxetine (CYMBALTA) 20 MG capsule Take 1 capsule (20 mg total) by mouth daily. 30 capsule 0   ferrous sulfate 325 (65 FE) MG EC tablet Take 325 mg by mouth 2 (two) times daily.     furosemide (LASIX) 80 MG tablet Please take 80 mg of Lasix on none HD days on Sunday, Monday, Wednesday and Friday 30 tablet 12   gabapentin (NEURONTIN) 100 MG capsule Take 2 capsules (200 mg total) by mouth at bedtime. 60 capsule 2   isosorbide mononitrate (IMDUR) 30 MG 24 hr tablet Take 30 mg by mouth every morning.     metoprolol succinate (TOPROL-XL) 50 MG 24 hr tablet Take 1 tablet (50 mg total) by mouth daily. 30 tablet 2   mupirocin ointment (BACTROBAN) 2 % Apply 1 Application topically daily. 22 g 0   omeprazole (PRILOSEC) 20 MG capsule Take 20 mg by mouth daily.     ondansetron (ZOFRAN) 4 MG tablet Take 1 tablet (4 mg total) by mouth daily as needed for nausea or vomiting. 20 tablet 1   Probiotic Product (PROBIOTIC PO) Take 1 capsule by mouth daily.     prochlorperazine (COMPAZINE) 5 MG tablet Take 1 tablet (5 mg total) by mouth every 6 (six) hours as needed for nausea or vomiting. 30 tablet 0   sevelamer carbonate (RENVELA) 800 MG tablet Take 1 tablet (800 mg total) by mouth 3 (three) times daily with meals. 90 tablet 0   zolpidem (AMBIEN) 5 MG tablet Take 1 tablet (5 mg total) by mouth at bedtime. 30 tablet 0   Cholecalciferol 25 MCG (1000 UT) tablet Take 1,000 Units by mouth daily. (Patient not taking: Reported on 01/09/2022)     No current facility-administered medications for this visit.    REVIEW OF SYSTEMS:  [X]  denotes positive finding, [ ]  denotes negative finding Cardiac  Comments:  Chest pain or chest pressure:    Shortness of breath upon exertion:    Short of breath when lying flat:    Irregular heart rhythm:        Vascular    Pain in calf, thigh, or hip brought on by ambulation:    Pain in feet at  night that wakes you up from your sleep:     Blood clot in your veins:    Leg swelling:  x       Pulmonary    Oxygen at home:    Productive cough:     Wheezing:  x       Neurologic    Sudden weakness in arms or legs:     Sudden numbness  in arms or legs:     Sudden onset of difficulty speaking or slurred speech:    Temporary loss of vision in one eye:     Problems with dizziness:         Gastrointestinal    Blood in stool:     Vomited blood:         Genitourinary    Burning when urinating:     Blood in urine:        Psychiatric    Major depression:         Hematologic    Bleeding problems:    Problems with blood clotting too easily:        Skin    Rashes or ulcers:        Constitutional    Fever or chills:      PHYSICAL EXAM: Vitals:   01/09/22 1405  BP: (!) 179/106  Pulse: 69  Temp: 97.9 F (36.6 C)  Weight: 210 lb 6.4 oz (95.4 kg)  Height: 5\' 8"  (1.727 m)    GENERAL: The patient is a well-nourished female, in no acute distress. The vital signs are documented above. CARDIOVASCULAR: 1+ radial pulses bilaterally.  Small surface veins bilaterally.  She seems to have some swelling in her left arm versus right arm to my physical exam.  Very small surface veins bilaterally. PULMONARY: There is good air exchange  MUSCULOSKELETAL: There are no major deformities or cyanosis. NEUROLOGIC: No focal weakness or paresthesias are detected. SKIN: There are no ulcers or rashes noted. PSYCHIATRIC: The patient has a normal affect.  DATA:  I imaged her surface veins with SonoSite ultrasound.  She has extremely small cephalic vein on the right.  She does have a moderate basilic vein above the elbow on the right.  On the left she does have a moderate-sized cephalic vein at the antecubital space and proximally.  MEDICAL ISSUES: Had a long discussion with the patient regarding long-term access for hemodialysis.  I discussed AV graft and AV fistula and the benefits and  disadvantages of each of these.  With her 10-year history of left subclavian catheter for which she is at extremely high risk for stenosis or occlusion of her left subclavian vein.  I would recommend avoidance of her left arm if possible.  She does have a moderate-sized basilic vein and may be a candidate for two-stage basilic vein fistula and then transposition.  I explained that we would make this call at the time of surgery and that she may be best served with an AV graft as her initial right arm access.  I am concerned regarding her risk for steal.  She has diminished radial pulses bilaterally and I did explain this phenomena to her as well.  I did explain that there are certain need for ongoing maintenance of the access regardless of graft or fistula.  We will proceed with surgery this coming Tuesday on 01/15/2022.  She currently undergoes dialysis on Tuesday Thursday Saturday and therefore we will coordinate altering her dialysis today so she can have surgery on this this coming Tuesday.   Rosetta Posner, MD FACS [facility] [phone] Pager [phone]  Note: Portions of this report may have been transcribed using voice recognition software.  Every effort has been made to ensure accuracy; however, inadvertent computerized transcription errors may still be present.

## 2022-01-09 NOTE — Progress Notes (Signed)
V[facility] and Vein Specialist of [place]  Patient name: Tracy Walsh MRN: [id number] DOB: 1965-07-15 Sex: female  REASON FOR CONSULT: Discuss access for hemodialysis  HPI: Tracy Walsh is a 56 y.o. female, who is here today for discussion of access for hemodialysis.  She has had progressive renal insufficiency and is now end-stage renal disease.  She is having dialysis via a right IJ tunnel catheter.  She has dialysis at [facility].  She is right-handed.  She does have a history of left subclavian vein Port-A-Cath placement over 10 years ago.  This was for infusion therapy related to her chronic inflammatory demyelinating polyneuropathy.  This has not been used for several years due to insurance reasons but she reports that this has been access for blood draws.  Does not sense any swelling in her left versus right arm.  Past Medical History:  Diagnosis Date   Anemia    Anxiety    Asthma    Bipolar disorder ([place]) 2018   type 2   CHF (congestive heart failure) (HCC)    CIDP (chronic inflammatory demyelinating polyneuropathy) (HCC)    CKD (chronic kidney disease)    COPD (chronic obstructive pulmonary disease) (HCC)    Depression    Diabetes mellitus without complication (HCC)    type 2   Diabetic foot ulcers (HCC)    Fibromyalgia    GERD (gastroesophageal reflux disease)    Hepatitis    Hep as a child - patient can give blood   History of blood transfusion 08/2021   1 unit per patient   History of esophagogastroduodenoscopy (EGD) 2018   History of kidney stones    passed stones   HLD (hyperlipidemia)    Hypertension    Migraines    Myocardial infarction (HCC)    x 2   Neuropathy    legs and feet - bilateral    History reviewed. No pertinent family history.  SOCIAL HISTORY: Social History   Socioeconomic History   Marital status: Married    Spouse name: Not on file   Number of children: Not on file   Years of education: Not on  file   Highest education level: Not on file  Occupational History   Not on file  Tobacco Use   Smoking status: Every Day    Packs/day: 0.50    Types: Cigarettes   Smokeless tobacco: Never  Vaping Use   Vaping Use: Never used  Substance and Sexual Activity   Alcohol use: Never   Drug use: Never   Sexual activity: Yes    Birth control/protection: Surgical    Comment: Hysterectomy  Other Topics Concern   Not on file  Social History Narrative   Not on file   Social Determinants of Health   Financial Resource Strain: Not on file  Food Insecurity: Not on file  Transportation Needs: Not on file  Physical Activity: Not on file  Stress: Not on file  Social Connections: Not on file  Intimate Partner Violence: Not on file    Allergies  Allergen Reactions   Amoxicillin Anaphylaxis   Clarithromycin Anaphylaxis, Diarrhea, Itching, Nausea And Vomiting and Rash   Codeine Itching   Hydrocodone Itching   Moxifloxacin Anaphylaxis, Anxiety, Hives, Itching, Nausea And Vomiting, Palpitations and Shortness Of Breath   Penicillins Anaphylaxis   Sulfa Antibiotics Anaphylaxis   Nitrofurantoin Nausea And Vomiting   Crab (Diagnostic) Hives   Latex Hives, Itching, Rash and Swelling    Current Outpatient Medications  Medication Sig  Dispense Refill   albuterol (PROVENTIL) (2.5 MG/3ML) 0.083% nebulizer solution Take 3 mLs (2.5 mg total) by nebulization every 6 (six) hours as needed for wheezing or shortness of breath. 75 mL 0   ARIPiprazole (ABILIFY) 5 MG tablet Take 5 mg by mouth daily.     Ascorbic Acid (VITAMIN C) 1000 MG tablet Take 1,000 mg by mouth daily.     aspirin EC 81 MG tablet Take 81 mg by mouth daily. Swallow whole.     busPIRone (BUSPAR) 7.5 MG tablet Take 1 tablet (7.5 mg total) by mouth 2 (two) times daily. 60 tablet 0   cyanocobalamin (,VITAMIN B-12,) 1000 MCG/ML injection Inject 1,000 mcg into the muscle every 30 (thirty) days.     dicyclomine (BENTYL) 10 MG capsule Take 1  capsule (10 mg total) by mouth 2 (two) times daily as needed for spasms. 60 capsule 1   DULoxetine (CYMBALTA) 20 MG capsule Take 1 capsule (20 mg total) by mouth daily. 30 capsule 0   ferrous sulfate 325 (65 FE) MG EC tablet Take 325 mg by mouth 2 (two) times daily.     furosemide (LASIX) 80 MG tablet Please take 80 mg of Lasix on none HD days on Sunday, Monday, Wednesday and Friday 30 tablet 12   gabapentin (NEURONTIN) 100 MG capsule Take 2 capsules (200 mg total) by mouth at bedtime. 60 capsule 2   isosorbide mononitrate (IMDUR) 30 MG 24 hr tablet Take 30 mg by mouth every morning.     metoprolol succinate (TOPROL-XL) 50 MG 24 hr tablet Take 1 tablet (50 mg total) by mouth daily. 30 tablet 2   mupirocin ointment (BACTROBAN) 2 % Apply 1 Application topically daily. 22 g 0   omeprazole (PRILOSEC) 20 MG capsule Take 20 mg by mouth daily.     ondansetron (ZOFRAN) 4 MG tablet Take 1 tablet (4 mg total) by mouth daily as needed for nausea or vomiting. 20 tablet 1   Probiotic Product (PROBIOTIC PO) Take 1 capsule by mouth daily.     prochlorperazine (COMPAZINE) 5 MG tablet Take 1 tablet (5 mg total) by mouth every 6 (six) hours as needed for nausea or vomiting. 30 tablet 0   sevelamer carbonate (RENVELA) 800 MG tablet Take 1 tablet (800 mg total) by mouth 3 (three) times daily with meals. 90 tablet 0   zolpidem (AMBIEN) 5 MG tablet Take 1 tablet (5 mg total) by mouth at bedtime. 30 tablet 0   Cholecalciferol 25 MCG (1000 UT) tablet Take 1,000 Units by mouth daily. (Patient not taking: Reported on 01/09/2022)     No current facility-administered medications for this visit.    REVIEW OF SYSTEMS:  [X]  denotes positive finding, [ ]  denotes negative finding Cardiac  Comments:  Chest pain or chest pressure:    Shortness of breath upon exertion:    Short of breath when lying flat:    Irregular heart rhythm:        Vascular    Pain in calf, thigh, or hip brought on by ambulation:    Pain in feet at  night that wakes you up from your sleep:     Blood clot in your veins:    Leg swelling:  x       Pulmonary    Oxygen at home:    Productive cough:     Wheezing:  x       Neurologic    Sudden weakness in arms or legs:     Sudden numbness  in arms or legs:     Sudden onset of difficulty speaking or slurred speech:    Temporary loss of vision in one eye:     Problems with dizziness:         Gastrointestinal    Blood in stool:     Vomited blood:         Genitourinary    Burning when urinating:     Blood in urine:        Psychiatric    Major depression:         Hematologic    Bleeding problems:    Problems with blood clotting too easily:        Skin    Rashes or ulcers:        Constitutional    Fever or chills:      PHYSICAL EXAM: Vitals:   01/09/22 1405  BP: (!) 179/106  Pulse: 69  Temp: 97.9 F (36.6 C)  Weight: 210 lb 6.4 oz (95.4 kg)  Height: 5\' 8"  (1.727 m)    GENERAL: The patient is a well-nourished female, in no acute distress. The vital signs are documented above. CARDIOVASCULAR: 1+ radial pulses bilaterally.  Small surface veins bilaterally.  She seems to have some swelling in her left arm versus right arm to my physical exam.  Very small surface veins bilaterally. PULMONARY: There is good air exchange  MUSCULOSKELETAL: There are no major deformities or cyanosis. NEUROLOGIC: No focal weakness or paresthesias are detected. SKIN: There are no ulcers or rashes noted. PSYCHIATRIC: The patient has a normal affect.  DATA:  I imaged her surface veins with SonoSite ultrasound.  She has extremely small cephalic vein on the right.  She does have a moderate basilic vein above the elbow on the right.  On the left she does have a moderate-sized cephalic vein at the antecubital space and proximally.  MEDICAL ISSUES: Had a long discussion with the patient regarding long-term access for hemodialysis.  I discussed AV graft and AV fistula and the benefits and  disadvantages of each of these.  With her 10-year history of left subclavian catheter for which she is at extremely high risk for stenosis or occlusion of her left subclavian vein.  I would recommend avoidance of her left arm if possible.  She does have a moderate-sized basilic vein and may be a candidate for two-stage basilic vein fistula and then transposition.  I explained that we would make this call at the time of surgery and that she may be best served with an AV graft as her initial right arm access.  I am concerned regarding her risk for steal.  She has diminished radial pulses bilaterally and I did explain this phenomena to her as well.  I did explain that there are certain need for ongoing maintenance of the access regardless of graft or fistula.  We will proceed with surgery this coming Tuesday on 01/15/2022.  She currently undergoes dialysis on Tuesday Thursday Saturday and therefore we will coordinate altering her dialysis today so she can have surgery on this this coming Tuesday.   Rosetta Posner, MD FACS [facility] [phone] Pager [phone]  Note: Portions of this report may have been transcribed using voice recognition software.  Every effort has been made to ensure accuracy; however, inadvertent computerized transcription errors may still be present.

## 2022-01-10 ENCOUNTER — Ambulatory Visit (INDEPENDENT_AMBULATORY_CARE_PROVIDER_SITE_OTHER): Payer: 59 | Admitting: Internal Medicine

## 2022-01-10 ENCOUNTER — Other Ambulatory Visit: Payer: Self-pay | Admitting: Internal Medicine

## 2022-01-10 ENCOUNTER — Encounter: Payer: Self-pay | Admitting: Internal Medicine

## 2022-01-10 ENCOUNTER — Telehealth: Payer: Self-pay | Admitting: Internal Medicine

## 2022-01-10 VITALS — BP 122/76 | HR 64 | Ht 68.0 in | Wt 212.4 lb

## 2022-01-10 DIAGNOSIS — M79604 Pain in right leg: Secondary | ICD-10-CM | POA: Diagnosis not present

## 2022-01-10 DIAGNOSIS — L03119 Cellulitis of unspecified part of limb: Secondary | ICD-10-CM | POA: Diagnosis not present

## 2022-01-10 DIAGNOSIS — M79605 Pain in left leg: Secondary | ICD-10-CM | POA: Diagnosis not present

## 2022-01-10 MED ORDER — CEPHALEXIN 500 MG PO CAPS: 500.0000 mg | ORAL_CAPSULE | Freq: Two times a day (BID) | ORAL | 0 refills | Status: AC

## 2022-01-10 MED ORDER — HYDROMORPHONE HCL 2 MG PO TABS: 1.0000 mg | ORAL_TABLET | Freq: Two times a day (BID) | ORAL | 0 refills | Status: DC | PRN

## 2022-01-10 MED ORDER — DOXYCYCLINE HYCLATE 100 MG PO TABS: 100.0000 mg | ORAL_TABLET | Freq: Two times a day (BID) | ORAL | 0 refills | Status: AC

## 2022-01-10 NOTE — Patient Instructions (Signed)
It was a pleasure to see you today.  Thank you for giving [place] the opportunity to be involved in your care.  Below is a brief recap of your visit and next steps.  We will plan to see you again in 2 weeks.  Summary I have prescribed doxycycline and keflex for treatment of the infection on your legs. I recommend routine use of compression stockings I have prescribed dilaudid for pain relief  Next steps Follow up in 2 weeks

## 2022-01-10 NOTE — Progress Notes (Signed)
Acute Office Visit  Subjective:     Patient ID: Tracy Walsh, female    DOB: 1965-09-26, 56 y.o.   MRN: [id number]  Chief Complaint  Patient presents with   Cellulitis    Both lower legs. Woke up 01/10/2022 with it   [name] presents for an acute visit today for pain and rash of her lower extremities.  She reports noticing increased pain and development of erythematous, itching, oozing patches on both of her legs.  She also endorses systemic symptoms of chills but has not checked her temperature.  She is concerned about an infection.  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  She states that she always has some degree of lower extremity pain due to swelling related to ESRD, however she feels that her pain has worsened since noticing the lesions on her legs.  Review of Systems  Musculoskeletal:        Bilateral lower extremity pain and swelling  Skin:  Positive for itching and rash.      Objective:    BP 122/76   Pulse 64   Ht 5\' 8"  (1.727 m)   Wt 212 lb 6.4 oz (96.3 kg)   SpO2 96%   BMI 32.30 kg/m   Physical Exam Musculoskeletal:     Right lower leg: Edema present.     Left lower leg: Edema present.     Comments: There is 2+ pitting lower extremity edema bilaterally  Skin:    General: Skin is warm.     Comments: There are multiple large, erythematous, excoriated, oozing patches on the lower extremities.       Assessment & Plan:   Problem List Items Addressed This Visit       Bilateral lower extremity pain    Presenting today for evaluation of bilateral lower extremity pain with multiple erythematous, oozing patches on both legs.  This is been present for the past 2 days.  She subjectively endorses chills.  Afebrile currently.  The bilateral nature of these erythematous patches argues against cellulitis, however t yellow, purulent discharge could suggest superimposed infection such as cellulitis secondary to scratching. Diffuse  excoriations present.  Accordingly, I have prescribed Keflex and doxycycline for empiric antibiotic coverage today.  I have encouraged routine use of compression stockings and leg elevation as able.  Of note, [name] has a documented penicillin allergy.  She states that she has previously taken multiple courses of Keflex without adverse reaction.  She has additionally requested temporary pain medication for relief.  I have prescribed Dilaudid 1 mg every 12 hours as needed.      Meds ordered this encounter  Medications   doxycycline (VIBRA-TABS) 100 MG tablet    Sig: Take 1 tablet (100 mg total) by mouth 2 (two) times daily for 5 days.    Dispense:  10 tablet    Refill:  0   cephALEXin (KEFLEX) 500 MG capsule    Sig: Take 1 capsule (500 mg total) by mouth 2 (two) times daily for 5 days.    Dispense:  10 capsule    Refill:  0   HYDROmorphone (DILAUDID) 2 MG tablet    Sig: Take 0.5 tablets (1 mg total) by mouth every 12 (twelve) hours as needed for up to 10 doses for severe pain.    Dispense:  5 tablet    Refill:  0    Return in about 2 weeks (around 01/24/2022).  [name],  MD   

## 2022-01-10 NOTE — Telephone Encounter (Signed)
[facility] called in patient behalf. Needs a call back before they can fill recent med request.   [phone]

## 2022-01-10 NOTE — Assessment & Plan Note (Addendum)
Presenting today for evaluation of bilateral lower extremity pain with multiple erythematous, oozing patches on both legs.  This is been present for the past 2 days.  She subjectively endorses chills.  Afebrile currently.  The bilateral nature of these erythematous patches argues against cellulitis, however t yellow, purulent discharge could suggest superimposed infection such as cellulitis secondary to scratching. Diffuse excoriations present.  Accordingly, I have prescribed Keflex and doxycycline for empiric antibiotic coverage today.  I have encouraged routine use of compression stockings and leg elevation as able.  Of note, Ms. Coble has a documented penicillin allergy.  She states that she has previously taken multiple courses of Keflex without adverse reaction.  She has additionally requested temporary pain medication for relief.  I have prescribed Dilaudid 1 mg every 12 hours as needed.

## 2022-01-11 ENCOUNTER — Encounter (HOSPITAL_COMMUNITY)
Admission: RE | Admit: 2022-01-11 | Discharge: 2022-01-11 | Disposition: A | Discharge status: Home / Self Care | Payer: 59 | Source: Ambulatory Visit | Attending: Vascular Surgery | Admitting: Vascular Surgery

## 2022-01-11 DIAGNOSIS — N184 Chronic kidney disease, stage 4 (severe): Secondary | ICD-10-CM

## 2022-01-11 DIAGNOSIS — N179 Acute kidney failure, unspecified: Secondary | ICD-10-CM

## 2022-01-14 ENCOUNTER — Encounter: Payer: Self-pay | Admitting: Hematology

## 2022-01-15 ENCOUNTER — Other Ambulatory Visit: Payer: Self-pay

## 2022-01-15 ENCOUNTER — Ambulatory Visit: Payer: Self-pay | Admitting: Surgery

## 2022-01-15 ENCOUNTER — Ambulatory Visit (HOSPITAL_COMMUNITY)
Admission: RE | Admit: 2022-01-15 | Discharge: 2022-01-15 | Disposition: A | Discharge status: Home / Self Care | Payer: 59 | Attending: Vascular Surgery | Admitting: Vascular Surgery

## 2022-01-15 ENCOUNTER — Ambulatory Visit (HOSPITAL_BASED_OUTPATIENT_CLINIC_OR_DEPARTMENT_OTHER): Payer: 59 | Admitting: Anesthesiology

## 2022-01-15 ENCOUNTER — Encounter: Payer: Self-pay | Admitting: Hematology

## 2022-01-15 ENCOUNTER — Ambulatory Visit (HOSPITAL_COMMUNITY): Payer: 59 | Admitting: Anesthesiology

## 2022-01-15 ENCOUNTER — Encounter (HOSPITAL_COMMUNITY)
Admission: RE | Disposition: A | Discharge status: Home / Self Care | Payer: Self-pay | Source: Home / Self Care | Attending: Vascular Surgery

## 2022-01-15 DIAGNOSIS — N184 Chronic kidney disease, stage 4 (severe): Secondary | ICD-10-CM

## 2022-01-15 DIAGNOSIS — Z992 Dependence on renal dialysis: Secondary | ICD-10-CM | POA: Diagnosis not present

## 2022-01-15 DIAGNOSIS — I2510 Atherosclerotic heart disease of native coronary artery without angina pectoris: Secondary | ICD-10-CM

## 2022-01-15 DIAGNOSIS — I132 Hypertensive heart and chronic kidney disease with heart failure and with stage 5 chronic kidney disease, or end stage renal disease: Secondary | ICD-10-CM | POA: Insufficient documentation

## 2022-01-15 DIAGNOSIS — N179 Acute kidney failure, unspecified: Secondary | ICD-10-CM

## 2022-01-15 DIAGNOSIS — D759 Disease of blood and blood-forming organs, unspecified: Secondary | ICD-10-CM | POA: Diagnosis not present

## 2022-01-15 DIAGNOSIS — N185 Chronic kidney disease, stage 5: Secondary | ICD-10-CM | POA: Diagnosis not present

## 2022-01-15 DIAGNOSIS — Z7984 Long term (current) use of oral hypoglycemic drugs: Secondary | ICD-10-CM | POA: Insufficient documentation

## 2022-01-15 DIAGNOSIS — E1122 Type 2 diabetes mellitus with diabetic chronic kidney disease: Secondary | ICD-10-CM | POA: Insufficient documentation

## 2022-01-15 DIAGNOSIS — N186 End stage renal disease: Secondary | ICD-10-CM

## 2022-01-15 DIAGNOSIS — J449 Chronic obstructive pulmonary disease, unspecified: Secondary | ICD-10-CM | POA: Insufficient documentation

## 2022-01-15 DIAGNOSIS — F17210 Nicotine dependence, cigarettes, uncomplicated: Secondary | ICD-10-CM

## 2022-01-15 DIAGNOSIS — D649 Anemia, unspecified: Secondary | ICD-10-CM | POA: Diagnosis not present

## 2022-01-15 DIAGNOSIS — F319 Bipolar disorder, unspecified: Secondary | ICD-10-CM | POA: Diagnosis not present

## 2022-01-15 DIAGNOSIS — F418 Other specified anxiety disorders: Secondary | ICD-10-CM | POA: Diagnosis not present

## 2022-01-15 DIAGNOSIS — R69 Illness, unspecified: Secondary | ICD-10-CM | POA: Diagnosis not present

## 2022-01-15 DIAGNOSIS — I252 Old myocardial infarction: Secondary | ICD-10-CM

## 2022-01-15 DIAGNOSIS — I5042 Chronic combined systolic (congestive) and diastolic (congestive) heart failure: Secondary | ICD-10-CM

## 2022-01-15 DIAGNOSIS — M797 Fibromyalgia: Secondary | ICD-10-CM | POA: Diagnosis not present

## 2022-01-15 DIAGNOSIS — K219 Gastro-esophageal reflux disease without esophagitis: Secondary | ICD-10-CM | POA: Diagnosis not present

## 2022-01-15 DIAGNOSIS — I509 Heart failure, unspecified: Secondary | ICD-10-CM | POA: Diagnosis not present

## 2022-01-15 DIAGNOSIS — G6181 Chronic inflammatory demyelinating polyneuritis: Secondary | ICD-10-CM | POA: Diagnosis not present

## 2022-01-15 DIAGNOSIS — Z955 Presence of coronary angioplasty implant and graft: Secondary | ICD-10-CM | POA: Insufficient documentation

## 2022-01-15 HISTORY — PX: AV FISTULA PLACEMENT: SHX1204

## 2022-01-15 LAB — BASIC METABOLIC PANEL
Anion gap: 9 (ref 5–15)
BUN: 18 mg/dL (ref 6–20)
CO2: 28 mmol/L (ref 22–32)
Calcium: 8.2 mg/dL — ABNORMAL LOW (ref 8.9–10.3)
Chloride: 101 mmol/L (ref 98–111)
Creatinine, Ser: 2.61 mg/dL — ABNORMAL HIGH (ref 0.44–1.00)
GFR, Estimated: 21 mL/min — ABNORMAL LOW (ref >60)
Glucose, Bld: 174 mg/dL — ABNORMAL HIGH (ref 70–99)
Potassium: 3.2 mmol/L — ABNORMAL LOW (ref 3.5–5.1)
Sodium: 138 mmol/L (ref 135–145)

## 2022-01-15 LAB — HEMOGLOBIN AND HEMATOCRIT, BLOOD
HCT: 28.5 % — ABNORMAL LOW (ref 36.0–46.0)
Hemoglobin: 8.9 g/dL — ABNORMAL LOW (ref 12.0–15.0)

## 2022-01-15 LAB — GLUCOSE, CAPILLARY: Glucose-Capillary: 166 mg/dL — ABNORMAL HIGH (ref 70–99)

## 2022-01-15 SURGERY — ARTERIOVENOUS (AV) FISTULA CREATION
Anesthesia: General | Site: Arm Lower | Laterality: Right

## 2022-01-15 MED ORDER — 0.9 % SODIUM CHLORIDE (POUR BTL) OPTIME
TOPICAL | Status: DC | PRN
  Administered 2022-01-15: 1000 mL

## 2022-01-15 MED ORDER — FENTANYL CITRATE (PF) 100 MCG/2ML IJ SOLN
INTRAMUSCULAR | Status: AC
  Filled 2022-01-15: qty 2

## 2022-01-15 MED ORDER — ORAL CARE MOUTH RINSE: 15.0000 mL | Freq: Once | OROMUCOSAL | Status: DC

## 2022-01-15 MED ORDER — HEPARIN SODIUM (PORCINE) 1000 UNIT/ML IJ SOLN
INTRAMUSCULAR | Status: AC
  Filled 2022-01-15: qty 6

## 2022-01-15 MED ORDER — PROPOFOL 10 MG/ML IV BOLUS
INTRAVENOUS | Status: AC
  Filled 2022-01-15: qty 20

## 2022-01-15 MED ORDER — LIDOCAINE-EPINEPHRINE 0.5 %-1:200000 IJ SOLN
INTRAMUSCULAR | Status: DC | PRN
  Administered 2022-01-15: 4 mL

## 2022-01-15 MED ORDER — PROPOFOL 10 MG/ML IV BOLUS
INTRAVENOUS | Status: DC | PRN
  Administered 2022-01-15: 30 mg via INTRAVENOUS

## 2022-01-15 MED ORDER — ONDANSETRON HCL 4 MG/2ML IJ SOLN: 4.0000 mg | Freq: Once | INTRAMUSCULAR | Status: DC | PRN

## 2022-01-15 MED ORDER — CHLORHEXIDINE GLUCONATE 4 % EX LIQD: 60.0000 mL | Freq: Once | CUTANEOUS | Status: DC

## 2022-01-15 MED ORDER — VANCOMYCIN HCL IN DEXTROSE 1-5 GM/200ML-% IV SOLN
1000.0000 mg | INTRAVENOUS | Status: AC
  Administered 2022-01-15: 1000 mg via INTRAVENOUS
  Filled 2022-01-15: qty 200

## 2022-01-15 MED ORDER — CHLORHEXIDINE GLUCONATE 0.12 % MT SOLN: 15.0000 mL | Freq: Once | OROMUCOSAL | Status: DC

## 2022-01-15 MED ORDER — PHENYLEPHRINE 80 MCG/ML (10ML) SYRINGE FOR IV PUSH (FOR BLOOD PRESSURE SUPPORT)
PREFILLED_SYRINGE | INTRAVENOUS | Status: AC
  Filled 2022-01-15: qty 10

## 2022-01-15 MED ORDER — LIDOCAINE-EPINEPHRINE 0.5 %-1:200000 IJ SOLN
INTRAMUSCULAR | Status: AC
  Filled 2022-01-15: qty 1

## 2022-01-15 MED ORDER — OXYCODONE-ACETAMINOPHEN 5-325 MG PO TABS: 1.0000 | ORAL_TABLET | Freq: Four times a day (QID) | ORAL | 0 refills | Status: DC | PRN

## 2022-01-15 MED ORDER — PROPOFOL 500 MG/50ML IV EMUL
INTRAVENOUS | Status: AC
  Filled 2022-01-15: qty 50

## 2022-01-15 MED ORDER — FENTANYL CITRATE PF 50 MCG/ML IJ SOSY: 25.0000 ug | PREFILLED_SYRINGE | INTRAMUSCULAR | Status: DC | PRN

## 2022-01-15 MED ORDER — SODIUM CHLORIDE 0.9 % IV SOLN: INTRAVENOUS | Status: DC

## 2022-01-15 MED ORDER — FENTANYL CITRATE (PF) 100 MCG/2ML IJ SOLN
INTRAMUSCULAR | Status: DC | PRN
  Administered 2022-01-15: 50 ug via INTRAVENOUS

## 2022-01-15 MED ORDER — ONDANSETRON HCL 4 MG/2ML IJ SOLN
INTRAMUSCULAR | Status: DC | PRN
  Administered 2022-01-15: 4 mg via INTRAVENOUS

## 2022-01-15 MED ORDER — SEVOFLURANE IN SOLN
RESPIRATORY_TRACT | Status: AC
  Filled 2022-01-15: qty 250

## 2022-01-15 MED ORDER — ONDANSETRON HCL 4 MG/2ML IJ SOLN
INTRAMUSCULAR | Status: AC
  Filled 2022-01-15: qty 2

## 2022-01-15 MED ORDER — DEXAMETHASONE SODIUM PHOSPHATE 10 MG/ML IJ SOLN
INTRAMUSCULAR | Status: AC
  Filled 2022-01-15: qty 2

## 2022-01-15 MED ORDER — HEPARIN 6000 UNIT IRRIGATION SOLUTION
Status: DC | PRN
  Administered 2022-01-15: 1

## 2022-01-15 MED ORDER — PROPOFOL 500 MG/50ML IV EMUL
INTRAVENOUS | Status: DC | PRN
  Administered 2022-01-15: 45 ug/kg/min via INTRAVENOUS
  Administered 2022-01-15: 75 ug/kg/min via INTRAVENOUS

## 2022-01-15 MED ORDER — LIDOCAINE HCL (PF) 2 % IJ SOLN
INTRAMUSCULAR | Status: AC
  Filled 2022-01-15: qty 10

## 2022-01-15 SURGICAL SUPPLY — 41 items
ADH SKN CLS APL DERMABOND .7 (GAUZE/BANDAGES/DRESSINGS) ×1
ARMBAND PINK RESTRICT EXTREMIT (MISCELLANEOUS) ×1 IMPLANT
BAG DECANTER FOR FLEXI CONT (MISCELLANEOUS) IMPLANT
BAG HAMPER (MISCELLANEOUS) ×1 IMPLANT
CANNULA VESSEL 3MM 2 BLNT TIP (CANNULA) ×1 IMPLANT
CLIP LIGATING EXTRA MED SLVR (CLIP) ×1 IMPLANT
CLIP LIGATING EXTRA SM BLUE (MISCELLANEOUS) ×1 IMPLANT
COVER LIGHT HANDLE STERIS (MISCELLANEOUS) ×2 IMPLANT
COVER MAYO STAND XLG (MISCELLANEOUS) ×1 IMPLANT
DECANTER SPIKE VIAL GLASS SM (MISCELLANEOUS) ×1 IMPLANT
DERMABOND ADVANCED .7 DNX12 (GAUZE/BANDAGES/DRESSINGS) ×1 IMPLANT
ELECT REM PT RETURN 9FT ADLT (ELECTROSURGICAL) ×1
ELECTRODE REM PT RTRN 9FT ADLT (ELECTROSURGICAL) ×1 IMPLANT
GAUZE SPONGE 4X4 12PLY STRL (GAUZE/BANDAGES/DRESSINGS) ×1 IMPLANT
GLOVE BIOGEL PI IND STRL 7.0 (GLOVE) ×2 IMPLANT
GLOVE BIOGEL PI IND STRL 7.5 (GLOVE) IMPLANT
GLOVE SURG SS PI 6.5 STRL IVOR (GLOVE) IMPLANT
GLOVE SURG SS PI 7.0 STRL IVOR (GLOVE) IMPLANT
GLOVE SURG SS PI 7.5 STRL IVOR (GLOVE) IMPLANT
GOWN STRL REUS W/TWL LRG LVL3 (GOWN DISPOSABLE) ×3 IMPLANT
IV NS 500ML (IV SOLUTION) ×1
IV NS 500ML BAXH (IV SOLUTION) ×1 IMPLANT
KIT BLADEGUARD II DBL (SET/KITS/TRAYS/PACK) ×1 IMPLANT
KIT TURNOVER KIT A (KITS) ×1 IMPLANT
MANIFOLD NEPTUNE II (INSTRUMENTS) ×1 IMPLANT
MARKER SKIN DUAL TIP RULER LAB (MISCELLANEOUS) ×2 IMPLANT
NDL HYPO 18GX1.5 BLUNT FILL (NEEDLE) ×1 IMPLANT
NEEDLE HYPO 18GX1.5 BLUNT FILL (NEEDLE) ×1 IMPLANT
NS IRRIG 1000ML POUR BTL (IV SOLUTION) ×1 IMPLANT
PACK CV ACCESS (CUSTOM PROCEDURE TRAY) ×1 IMPLANT
PAD ARMBOARD 7.5X6 YLW CONV (MISCELLANEOUS) ×1 IMPLANT
SET BASIN LINEN APH (SET/KITS/TRAYS/PACK) ×1 IMPLANT
SOL PREP POV-IOD 4OZ 10% (MISCELLANEOUS) ×1 IMPLANT
SOL PREP PROV IODINE SCRUB 4OZ (MISCELLANEOUS) ×1 IMPLANT
SPONGE T-LAP 18X18 ~~LOC~~+RFID (SPONGE) ×1 IMPLANT
SUT PROLENE 6 0 CC (SUTURE) ×1 IMPLANT
SUT VIC AB 3-0 SH 27 (SUTURE) ×1
SUT VIC AB 3-0 SH 27X BRD (SUTURE) ×1 IMPLANT
SYR 10ML LL (SYRINGE) ×1 IMPLANT
SYR 50ML LL SCALE MARK (SYRINGE) ×1 IMPLANT
UNDERPAD 30X36 HEAVY ABSORB (UNDERPADS AND DIAPERS) ×1 IMPLANT

## 2022-01-15 NOTE — Anesthesia Postprocedure Evaluation (Signed)
Anesthesia Post Note  Patient: Tracy Walsh  Procedure(s) Performed: RIGHT ARM ARTERIOVENOUS (AV) FISTULA CREATION (Right: Arm Lower)  Patient location during evaluation: Phase II Anesthesia Type: General Level of consciousness: awake and alert and oriented Pain management: pain level controlled Vital Signs Assessment: post-procedure vital signs reviewed and stable Respiratory status: spontaneous breathing, nonlabored ventilation and respiratory function stable Cardiovascular status: blood pressure returned to baseline and stable Postop Assessment: no apparent nausea or vomiting Anesthetic complications: no   No notable events documented.   Last Vitals:  Vitals:   01/15/22 0900 01/15/22 0911  BP: (!) 177/74 (!) 180/73  Pulse: 64 66  Resp: 11 14  Temp:  37 C  SpO2: 97% 92%    Last Pain:  Vitals:   01/15/22 0841  TempSrc:   PainSc: 4                  Sahvannah Rieser C Jolana Runkles

## 2022-01-15 NOTE — Anesthesia Preprocedure Evaluation (Addendum)
Anesthesia Evaluation  Patient identified by MRN, date of birth, ID band Patient awake    Reviewed: Allergy & Precautions, NPO status , Patient's Chart, lab work & pertinent test results  Airway Mallampati: II  TM Distance: >3 FB Neck ROM: Full    Dental  (+) Dental Advisory Given, Missing, Poor Dentition, Chipped   Pulmonary asthma , COPD,  COPD inhaler, Current Smoker and Patient abstained from smoking.,    Pulmonary exam normal breath sounds clear to auscultation       Cardiovascular Exercise Tolerance: Poor hypertension, Pt. on medications + CAD, + Past MI, + Cardiac Stents and +CHF  Normal cardiovascular exam Rhythm:Regular Rate:Normal  1. Left ventricular ejection fraction, by estimation, is 40 to 45%. The left ventricle has mildly decreased function. The left ventricle demonstrates global hypokinesis. There is mild left ventricular hypertrophy. Left ventricular diastolic parameters are consistent with Grade II diastolic dysfunction (pseudonormalization).  2. Right ventricular systolic function is normal. The right ventricular size is normal. There is moderately elevated pulmonary artery systolic  pressure.  3. Left atrial size was mild to moderately dilated.  4. Functional MR. The mitral valve is grossly normal. Mild to moderate mitral valve regurgitation.  5. The aortic valve is grossly normal. Aortic valve regurgitation is not visualized.  6. The inferior vena cava is dilated in size with <50% respiratory variability, suggesting right atrial pressure of 15 mmHg.   Comparison(s): No prior Echocardiogram.    Neuro/Psych  Headaches, PSYCHIATRIC DISORDERS Anxiety Depression Bipolar Disorder  Neuromuscular disease    GI/Hepatic GERD  Medicated and Controlled,(+) Hepatitis -  Endo/Other  diabetes, Well Controlled, Type 2, Oral Hypoglycemic Agents  Renal/GU ESRF and DialysisRenal disease  negative genitourinary    Musculoskeletal  (+) Fibromyalgia -  Abdominal   Peds negative pediatric ROS (+)  Hematology  (+) Blood dyscrasia, anemia ,   Anesthesia Other Findings 16-Dec-2021 10:23:32 [facility] ROUTINE RECORD 06/20/1965 (55 yr) Female Caucasian PR interval 159 ms QRS duration 130 ms QT/QTcB 458/458 ms P-R-T axes 19 -43 -2 Sinus rhythm Nonspecific T wave abnormality inferior leads Confirmed by Godfrey Pick ([id number]) on 12/16/2021 12:29:03 PM  Reproductive/Obstetrics negative OB ROS                             Anesthesia Physical Anesthesia Plan  ASA: 3  Anesthesia Plan: General   Post-op Pain Management: Dilaudid IV   Induction: Intravenous  PONV Risk Score and Plan: 3 and Ondansetron and Propofol infusion  Airway Management Planned: Nasal Cannula, Natural Airway and Simple Face Mask  Additional Equipment:   Intra-op Plan:   Post-operative Plan:   Informed Consent: I have reviewed the patients History and Physical, chart, labs and discussed the procedure including the risks, benefits and alternatives for the proposed anesthesia with the patient or authorized representative who has indicated his/her understanding and acceptance.     Dental advisory given  Plan Discussed with: CRNA and Surgeon  Anesthesia Plan Comments: (Possible GA with airway was discussed)       Anesthesia Quick Evaluation

## 2022-01-15 NOTE — Transfer of Care (Signed)
Immediate Anesthesia Transfer of Care Note  Patient: Tracy Walsh  Procedure(s) Performed: RIGHT ARM ARTERIOVENOUS (AV) FISTULA CREATION (Right: Arm Lower)  Patient Location: PACU  Anesthesia Type:General  Level of Consciousness: awake and patient cooperative  Airway & Oxygen Therapy: Patient Spontanous Breathing  Post-op Assessment: Report given to RN and Post -op Vital signs reviewed and stable  Post vital signs: Reviewed and stable  Last Vitals:  Vitals Value Taken Time  BP 187/63 01/15/22 0839  Temp 37 C 01/15/22 0841  Pulse 64 01/15/22 0842  Resp 11 01/15/22 0842  SpO2 97 % 01/15/22 0842  Vitals shown include unvalidated device data.  Last Pain:  Vitals:   01/15/22 0701  TempSrc: Oral  PainSc: 6       Patients Stated Pain Goal: 7 (56/72/09 1980)  Complications: No notable events documented.

## 2022-01-15 NOTE — Op Note (Signed)
    OPERATIVE REPORT  DATE OF SURGERY: 01/15/2022  PATIENT: Tracy Walsh, 56 y.o. female MRN: [id number]  DOB: 1965/07/11  PRE-OPERATIVE DIAGNOSIS: End-stage renal disease  POST-OPERATIVE DIAGNOSIS:  Same  PROCEDURE: Right for stage brachiobasilic fistula creation  SURGEON:  Curt Jews, M.D.  PHYSICIAN ASSISTANT: Vevelyn Royals, RNFA  The assistant was needed for exposure and to expedite the case  ANESTHESIA: Local with sedation  EBL: per anesthesia record  Total I/O In: 300 [P.O.:50; I.V.:50; IV Piggyback:200] Out: 5 [Blood:5]  BLOOD ADMINISTERED: none  DRAINS: none  SPECIMEN: none  COUNTS CORRECT:  YES  PATIENT DISPOSITION:  PACU - hemodynamically stable  PROCEDURE DETAILS: Patient was taken operating placed supine position area of the right arm prepped draped you sterile fashion.  SonoSite ultrasound was used to visualize the veins.  The patient had extremely small cephalic vein but did have a moderate size basilic vein at the antecubital space.  Using local anesthesia incision was made over the antecubital space and carried down to isolate the basilic vein which was of good caliber and the brachial artery which was also of good caliber.  The vein was mobilized proximally and distally and tributary branches were ligated with 4-0 silk ties and divided.  The artery was encircled with a blue vessel loop.  The artery was occluded proximally and distally and a small arteriotomy was created to reduce risk of steal.  The vein was cut to the appropriate length and was ligated distally and mobilized to the level of the brachial artery.  The vein was sewn end-to-side to the artery with a running 6-0 Prolene suture.  Clamps were removed and excellent thrill was noted through the vein.  The wounds were irrigated with saline.  Hemostasis was obtained with electrocautery.  The wounds were closed with 3-0 Vicryl in the subcutaneous and subcuticular tissue.  Sterile dressing with Dermabond was  applied.  The patient did not have a radial pulse preoperatively.   Rosetta Posner, M.D., [facility] 01/15/2022 9:13 AM  Note: Portions of this report may have been transcribed using voice recognition software.  Every effort has been made to ensure accuracy; however, inadvertent computerized transcription errors may still be present.

## 2022-01-15 NOTE — Discharge Instructions (Signed)
[facility]  Discharge Instructions  AV Fistula or Graft Surgery for Dialysis Access  Please refer to the following instructions for your post-procedure care. Your surgeon or physician assistant will discuss any changes with you.  Activity  You may drive the day following your surgery, if you are comfortable and no longer taking prescription pain medication. Resume full activity as the soreness in your incision resolves.  Bathing/Showering  You may shower after you go home. Keep your incision dry for 48 hours. Do not soak in a bathtub, hot tub, or swim until the incision heals completely. You may not shower if you have a hemodialysis catheter.  Incision Care  Clean your incision with mild soap and water after 48 hours. Pat the area dry with a clean towel. You do not need a bandage unless otherwise instructed. Do not apply any ointments or creams to your incision. You may have skin glue on your incision. Do not peel it off. It will come off on its own in about one week. Your arm may swell a bit after surgery. To reduce swelling use pillows to elevate your arm so it is above your heart. Your doctor will tell you if you need to lightly wrap your arm with an ACE bandage.  Diet  Resume your normal diet. There are not special food restrictions following this procedure. In order to heal from your surgery, it is CRITICAL to get adequate nutrition. Your body requires vitamins, minerals, and protein. Vegetables are the best source of vitamins and minerals. Vegetables also provide the perfect balance of protein. Processed food has little nutritional value, so try to avoid this.  Medications  Resume taking all of your medications. If your incision is causing pain, you may take over-the counter pain relievers such as acetaminophen (Tylenol). If you were prescribed a stronger pain medication, please be aware these medications can cause nausea and constipation. Prevent  nausea by taking the medication with a snack or meal. Avoid constipation by drinking plenty of fluids and eating foods with high amount of fiber, such as fruits, vegetables, and grains.  Do not take Tylenol if you are taking prescription pain medications.  Follow up Your surgeon may want to see you in the office following your access surgery. If so, this will be arranged at the time of your surgery.  Please call us immediately for any of the following conditions:  Increased pain, redness, drainage (pus) from your incision site Fever of 101 degrees or higher Severe or worsening pain at your incision site Hand pain or numbness.  Reduce your risk of vascular disease:  Stop smoking. If you would like help, call [facility] at 1-800-QUIT-NOW 651-413-3027) or [place] at [place] your cholesterol Maintain a desired weight Control your diabetes Keep your blood pressure down  Dialysis  It will take several weeks to several months for your new dialysis access to be ready for use. Your surgeon will determine when it is okay to use it. Your nephrologist will continue to direct your dialysis. You can continue to use your Permcath until your new access is ready for use.   01/15/2022 Tracy Walsh [id number] [birth year]  Surgeon(s): Andriel Omalley, Arvilla Meres, MD  Procedure(s): RIGHT ARM ARTERIOVENOUS (AV) FISTULA CREATION   May stick graft immediately   May stick graft on designated area only:    Do not stick fistula for 12 weeks    If you have any questions, please call the office at [phone].

## 2022-01-15 NOTE — Interval H&P Note (Signed)
History and Physical Interval Note:  01/15/2022 7:18 AM  Tracy Walsh  has presented today for surgery, with the diagnosis of ESRD.  The various methods of treatment have been discussed with the patient and family. After consideration of risks, benefits and other options for treatment, the patient has consented to  Procedure(s): RIGHT ARM ARTERIOVENOUS (AV) FISTULA VERSUS GRAFT CREATION (Right) as a surgical intervention.  The patient's history has been reviewed, patient examined, no change in status, stable for surgery.  I have reviewed the patient's chart and labs.  Questions were answered to the patient's satisfaction.     Curt Jews

## 2022-01-15 NOTE — Anesthesia Procedure Notes (Signed)
Date/Time: 01/15/2022 7:30 AM  Performed by: Vista Deck, CRNAPre-anesthesia Checklist: Patient identified, Emergency Drugs available, Suction available, Timeout performed and Patient being monitored Patient Re-evaluated:Patient Re-evaluated prior to induction Oxygen Delivery Method: Nasal Cannula

## 2022-01-17 ENCOUNTER — Inpatient Hospital Stay: Admitting: Physician Assistant

## 2022-01-17 ENCOUNTER — Inpatient Hospital Stay

## 2022-01-17 ENCOUNTER — Telehealth: Payer: Self-pay | Admitting: Family

## 2022-01-17 NOTE — Telephone Encounter (Signed)
Called and lm on vm to advise verbal ok for orders below and to call with any questions.

## 2022-01-17 NOTE — Telephone Encounter (Signed)
Tracy Walsh (PT) from [place] called requesting continued home health pt. 2wk 1 and 1 wk 1. Tracy Walsh phone number is [phone]

## 2022-01-21 ENCOUNTER — Encounter (HOSPITAL_COMMUNITY): Payer: Self-pay | Admitting: Vascular Surgery

## 2022-01-21 ENCOUNTER — Ambulatory Visit (INDEPENDENT_AMBULATORY_CARE_PROVIDER_SITE_OTHER): Payer: 59 | Admitting: Surgery

## 2022-01-21 ENCOUNTER — Telehealth: Payer: Self-pay | Admitting: Internal Medicine

## 2022-01-21 VITALS — BP 152/72 | HR 73 | Temp 98.3°F | Ht 68.0 in | Wt 207.0 lb

## 2022-01-21 DIAGNOSIS — I130 Hypertensive heart and chronic kidney disease with heart failure and stage 1 through stage 4 chronic kidney disease, or unspecified chronic kidney disease: Secondary | ICD-10-CM | POA: Diagnosis not present

## 2022-01-21 DIAGNOSIS — N186 End stage renal disease: Secondary | ICD-10-CM | POA: Diagnosis not present

## 2022-01-21 DIAGNOSIS — E08621 Diabetes mellitus due to underlying condition with foot ulcer: Secondary | ICD-10-CM | POA: Diagnosis not present

## 2022-01-21 DIAGNOSIS — E1140 Type 2 diabetes mellitus with diabetic neuropathy, unspecified: Secondary | ICD-10-CM | POA: Diagnosis not present

## 2022-01-21 DIAGNOSIS — J449 Chronic obstructive pulmonary disease, unspecified: Secondary | ICD-10-CM | POA: Diagnosis not present

## 2022-01-21 DIAGNOSIS — Z992 Dependence on renal dialysis: Secondary | ICD-10-CM | POA: Diagnosis not present

## 2022-01-21 DIAGNOSIS — N184 Chronic kidney disease, stage 4 (severe): Secondary | ICD-10-CM | POA: Diagnosis not present

## 2022-01-21 DIAGNOSIS — E1122 Type 2 diabetes mellitus with diabetic chronic kidney disease: Secondary | ICD-10-CM | POA: Diagnosis not present

## 2022-01-21 DIAGNOSIS — D649 Anemia, unspecified: Secondary | ICD-10-CM | POA: Diagnosis not present

## 2022-01-21 DIAGNOSIS — L97421 Non-pressure chronic ulcer of left heel and midfoot limited to breakdown of skin: Secondary | ICD-10-CM | POA: Diagnosis not present

## 2022-01-21 DIAGNOSIS — I509 Heart failure, unspecified: Secondary | ICD-10-CM | POA: Diagnosis not present

## 2022-01-21 NOTE — Telephone Encounter (Signed)
Elta Guadeloupe with [facility] [phone]  Called stating pt weight is supposed to be within the range on 193-203. This am she was at 206. Pt did not weight before she ate. Elta Guadeloupe just needed to report the weight.

## 2022-01-21 NOTE — Patient Instructions (Addendum)
Our surgery scheduler Barbara will call you within 24-48 hours to get you scheduled. If you have not heard from her after 48 hours, please call our office. Have the blue sheet available when she calls to write down important information.  If you have any concerns or questions, please feel free to call our office.   Peritoneal Dialysis Catheter Placement  Peritoneal dialysis catheter placement is a surgery to insert a thin, flexible tube (catheter) into the abdomen. The catheter will be used for peritoneal dialysis, which is a process for filtering the blood. The catheter is small, soft, and easy to conceal. The catheter placement is usually done at least 2 weeks before peritoneal dialysis is started. During dialysis, wastes, salt, and extra water are removed from the blood. In peritoneal dialysis, these tasks are performed by transferring a fluid (dialysate) to and from the abdomen during each session. The fluid goes through the catheter to enter the abdomen at the start of each dialysis session, and it drains out of the body through the catheter at the end of each session. This procedure is done using one of the following techniques: Open technique. This is when the surgery is performed through one large incision. Laparoscopic technique. This is when smaller incisions are made and a tube with a light and camera (laparoscope) is inserted through one of the incisions to help perform the surgery. The camera sends images to a video screen in the operating room. This lets the surgeon see inside the abdomen during the procedure. Tell a health care provider about: Any allergies you have. All medicines you are taking, including vitamins, herbs, eye drops, creams, and over-the-counter medicines. Any problems you or family members have had with anesthetic medicines. Any blood disorders you have. Any surgeries you have had. Any history of smoking. Any medical conditions you have. Whether you are pregnant or  may be pregnant. What are the risks? Generally, this is a safe procedure. However, problems may occur, including: Infection. Too much bleeding. A collection of blood near the incision (hematoma). Damage to blood vessels, tissues, or organs in the abdomen area. Allergic reactions to medicines. Pain or cramping. Slow healing. Catheter problems after the surgery. The catheter may: Become blocked. Move out of place. Poke or wrap around intestines. Allow fluid to leak around it. Scarring. Skin damage. What happens before the procedure? Staying hydrated Follow instructions from your health care provider about hydration, which may include: Up to 2 hours before the procedure - you may continue to drink clear liquids, such as water, clear fruit juice, black coffee, and plain tea.  Eating and drinking restrictions Follow instructions from your health care provider about eating and drinking, which may include: 8 hours before the procedure - stop eating heavy meals or foods, such as meat, fried foods, or fatty foods. 6 hours before the procedure - stop eating light meals or foods, such as toast or cereal. 6 hours before the procedure - stop drinking milk or drinks that contain milk. 2 hours before the procedure - stop drinking clear liquids. Medicines Ask your health care provider about: Changing or stopping your regular medicines. This is especially important if you are taking diabetes medicines or blood thinners. Taking medicines such as aspirin and ibuprofen. These medicines can thin your blood. Do not take these medicines unless your health care provider tells you to take them. Taking over-the-counter medicines, vitamins, herbs, and supplements. Surgery safety Ask your health care provider: How your surgery site will be marked. What   steps will be taken to help prevent infection. These steps may include: Removing hair at the surgery site. Washing skin with a germ-killing soap. Taking  antibiotic medicine. General instructions You may have a CT scan or ultrasound of your abdomen. You may have a blood sample taken. Plan to have a responsible adult take you home from the hospital or clinic. Your health care provider will discuss the best site for the catheter to be placed. The site will be chosen: To help prevent the catheter from being flattened or damaged. To make it as comfortable as possible for you. What happens during the procedure? An IV will be inserted into one of your veins. You will be given one or both of the following: A medicine to help you relax (sedative). A medicine to numb the area (local anesthetic). If you are having an open surgery, one large incision will be made in the abdomen. If you are having laparoscopic surgery, small incisions will be made in the abdomen. A laparoscope and instruments will be put through the incisions. The catheter will be put in place. A short tunnel will be made under the skin to a location where the catheter exits the abdomen. Stitches (sutures) will be placed around the catheter to hold it in place. Your incisions will be closed with sutures or staples. The procedure may vary among health care providers and hospitals. What happens after the procedure? Your blood pressure, heart rate, breathing rate, and blood oxygen level will be monitored until you leave the hospital or clinic. You may have some pain. You will be given pain medicine as needed. You will be given instructions about how to care for your catheter and how it is used for the dialysis process. Summary Peritoneal dialysis catheter placement is a surgery to insert a thin, flexible tube (catheter) in your abdomen. This surgery must be done before you begin peritoneal dialysis. Before the procedure, your health care provider will discuss the best site for the catheter to be placed. The site will be chosen to help prevent the catheter from being flattened or damaged,  and to make it as comfortable as possible for you. After the procedure, you will be given instructions about how to care for your catheter and how it is used for the dialysis process. This information is not intended to replace advice given to you by your health care provider. Make sure you discuss any questions you have with your health care provider. Document Revised: 11/18/2019 Document Reviewed: 11/18/2019 Elsevier Patient Education  2023 [employer]  

## 2022-01-22 NOTE — Progress Notes (Signed)
Request for Medical Clearance has been faxed to Dr Marland Kitchen.

## 2022-01-23 ENCOUNTER — Inpatient Hospital Stay

## 2022-01-23 ENCOUNTER — Inpatient Hospital Stay: Admitting: Physician Assistant

## 2022-01-23 ENCOUNTER — Telehealth: Payer: Self-pay | Admitting: Surgery

## 2022-01-23 NOTE — Telephone Encounter (Signed)
Outgoing call, left message to call.  Please inform patient of the following regarding scheduled surgery.   Pre-Admission date/time, and Surgery date at [facility].  Surgery Date: 02/01/22 Preadmission Testing Date: 01/28/22 (phone 8a-1p)  Also patient will need to call at [phone], between 1-3:00pm the day before surgery, to find out what time to arrive for surgery.

## 2022-01-23 NOTE — Telephone Encounter (Signed)
Incoming call from patient, she is now informed of all dates regarding her surgery, patient verbalized understanding.

## 2022-01-24 ENCOUNTER — Ambulatory Visit (HOSPITAL_COMMUNITY)
Admission: RE | Admit: 2022-01-24 | Discharge: 2022-01-24 | Disposition: A | Discharge status: Home / Self Care | Payer: 59 | Source: Ambulatory Visit | Attending: Vascular Surgery | Admitting: Vascular Surgery

## 2022-01-24 ENCOUNTER — Ambulatory Visit (INDEPENDENT_AMBULATORY_CARE_PROVIDER_SITE_OTHER): Admitting: Vascular Surgery

## 2022-01-24 ENCOUNTER — Encounter: Payer: Self-pay | Admitting: Vascular Surgery

## 2022-01-24 ENCOUNTER — Telehealth: Payer: Self-pay

## 2022-01-24 ENCOUNTER — Other Ambulatory Visit (INDEPENDENT_AMBULATORY_CARE_PROVIDER_SITE_OTHER): Payer: Self-pay | Admitting: Gastroenterology

## 2022-01-24 VITALS — BP 177/85 | HR 73 | Temp 98.0°F | Resp 20 | Ht 68.0 in | Wt 207.0 lb

## 2022-01-24 DIAGNOSIS — N186 End stage renal disease: Secondary | ICD-10-CM

## 2022-01-24 DIAGNOSIS — T82898A Other specified complication of vascular prosthetic devices, implants and grafts, initial encounter: Secondary | ICD-10-CM | POA: Diagnosis not present

## 2022-01-24 DIAGNOSIS — Z992 Dependence on renal dialysis: Secondary | ICD-10-CM

## 2022-01-24 NOTE — Telephone Encounter (Signed)
Pt called stating that her arm had become swollen and her fingers are partially numb.  Reviewed pt's chart, returned call for clarification, two identifiers used. Pt stated that her R arm has swollen to twice the size, starting yesterday without any provocation. All the fingers on her R hand are partially numb, tingling, and she can't grip well. There is also clear drainage from her incision. Her husband has been changing the dressing BID d/t being saturated. She denies any pus or odor. Appts scheduled for [place] and MD. Pt leaving HD center without treatment, will do makeup session tomorrow, in order to be seen. Confirmed understanding.

## 2022-01-24 NOTE — Telephone Encounter (Signed)
Last OV 11/26/21

## 2022-01-24 NOTE — H&P (View-Only) (Signed)
Surgical Consultation  01/24/2022  Tracy Walsh is an 56 y.o. female.   Chief Complaint  Patient presents with   New Patient (Initial Visit)    PD cath placement    HPI: Tracy Walsh is seen in consultation at the request of Dr. August Albino for placement of PD catheter. She  Does have a history of diabetes, CAD, hypertension and congestive heart failure.  She did have a right acute basilic fistula performed recently. She is interested in pursuing home dialysis if possible.  He is getting currently dialysis via right IJ. Did have a chest x-ray recently that have personally reviewed showing evidence of some enlarged heart. Did have a recent CT scan 5 months ago of the abdomen and pelvis that I have personally reviewed, no major intra-abdominal pathology was seen .  BC shows chronic anemia hb 8.9, nml PLs, creat 2.6 and K3.2  Past Medical History:  Diagnosis Date   Anemia    Anxiety    Asthma    Bipolar disorder ([place]) 2018   type 2   CHF (congestive heart failure) (HCC)    CIDP (chronic inflammatory demyelinating polyneuropathy) (HCC)    CKD (chronic kidney disease)    COPD (chronic obstructive pulmonary disease) (HCC)    Depression    Diabetes mellitus without complication (HCC)    type 2   Diabetic foot ulcers (HCC)    Fibromyalgia    GERD (gastroesophageal reflux disease)    Hepatitis    Hep as a child - patient can give blood   History of blood transfusion 08/2021   1 unit per patient   History of esophagogastroduodenoscopy (EGD) 2018   History of kidney stones    passed stones   HLD (hyperlipidemia)    Hypertension    Migraines    Myocardial infarction (HCC)    x 2   Neuropathy    legs and feet - bilateral    Past Surgical History:  Procedure Laterality Date   ABDOMINAL HYSTERECTOMY     APPENDECTOMY     AV FISTULA PLACEMENT Right 01/15/2022   Procedure: RIGHT ARM ARTERIOVENOUS (AV) FISTULA CREATION;  Surgeon: Rosetta Posner, MD;  Location: [facility];  Service: Vascular;   Laterality: Right;   BILATERAL NASAL FRACTURE CLOSED REDUCTION  10/2020   CHOLECYSTECTOMY     COLONOSCOPY     CORONARY ANGIOPLASTY WITH STENT PLACEMENT  01/15/2021   FOOT SURGERY Right    I & D   I & D EXTREMITY Left 09/28/2021   Procedure: LEFT HEEL DEBRIDEMENT AND TISSUE GRAFT;  Surgeon: Newt Minion, MD;  Location: [place] [facility];  Service: Orthopedics;  Laterality: Left;   IR FLUORO GUIDE CV LINE RIGHT  12/20/2021   IR US GUIDE VASC ACCESS RIGHT  12/20/2021   SHOULDER SURGERY Bilateral    SPINAL FUSION     UPPER GI ENDOSCOPY      History reviewed. No pertinent family history.  Social History:  reports that she has been smoking cigarettes. She has been smoking an average of .5 packs per day. She has never used smokeless tobacco. She reports that she does not drink alcohol and does not use drugs.  Allergies:  Allergies  Allergen Reactions   Amoxicillin Anaphylaxis   Clarithromycin Anaphylaxis, Diarrhea, Itching, Nausea And Vomiting and Rash   Codeine Itching   Hydrocodone Itching   Moxifloxacin Anaphylaxis, Anxiety, Hives, Itching, Nausea And Vomiting, Palpitations and Shortness Of Breath   Penicillins Anaphylaxis   Sulfa Antibiotics Anaphylaxis   Nitrofurantoin Nausea And  Vomiting   Crab (Diagnostic) Hives   Latex Hives, Itching, Rash and Swelling    Medications reviewed.   ROS Full ROS performed and is otherwise negative other than what is stated in the HPI   BP (!) 152/72   Pulse 73   Temp 98.3 F (36.8 C) (Oral)   Ht 5\' 8"  (1.727 m)   Wt 207 lb (93.9 kg)   SpO2 94%   BMI 31.47 kg/m   Physical Exam Vitals and nursing note reviewed. Exam conducted with a chaperone present.  Constitutional:      General: She is not in acute distress.    Appearance: Normal appearance. She is not ill-appearing.  Cardiovascular:     Rate and Rhythm: Normal rate and regular rhythm.  Pulmonary:     Effort: Pulmonary effort is normal. No respiratory distress.     Breath sounds: Normal  breath sounds. No stridor.  Abdominal:     General: Abdomen is flat. There is no distension.     Palpations: Abdomen is soft. There is no mass.     Tenderness: There is no abdominal tenderness. There is no guarding or rebound.     Hernia: No hernia is present.  Musculoskeletal:        General: No swelling or tenderness. Normal range of motion.     Cervical back: Normal range of motion and neck supple. No rigidity or tenderness.     Comments: Evidence of right brachiobasilic fistula with good thrill.  No evidence of infection  Skin:    General: Skin is warm and dry.     Capillary Refill: Capillary refill takes less than 2 seconds.  Neurological:     General: No focal deficit present.     Mental Status: She is alert and oriented to person, place, and time.  Psychiatric:        Mood and Affect: Mood normal.        Behavior: Behavior normal.        Thought Content: Thought content normal.        Judgment: Judgment normal.    Assessment/Plan: 56 year old female with end-stage renal disease in need for dialysis.  Discussed with the patient in detail about different modalities for dialysis to include hemodialysis and peritoneal route.  She seems to be very interested in being dependent and performing dialysis at her house.  SHe Does have already a fistula and she is thinking about letting it mature and use it as a backup. Discussion regarding the peritoneal dialysis set up and they proceeded to place a peritoneal dialysis catheter laparoscopically.  The risks were explained to her, benefits and possible applications including but not limited to: Bleeding, infection catheter malfunction, bowel injuries, she understands.  She is ready to move forward with peritoneal dialysis catheter.  She has already inquire with [facility] about further teaching. We will plan to perform Lap PD cath at her convinience. I spent 55 minutes this encounter including personally reviewing imaging studies, placing orders,  coordinating her care and performing appropriate documentation A copy of this report was sent to the referring provider   Caroleen Hamman, MD [facility] General Surgeon

## 2022-01-24 NOTE — Progress Notes (Signed)
Patient name: Tracy Walsh MRN: [id number] DOB: 1965/12/18 Sex: female  REASON FOR VISIT:   Follow-up after right first stage basilic vein transposition.  HPI:   Tracy Walsh is a pleasant 56 y.o. female who underwent a first stage right basilic vein transposition on 01/15/2022.  This morning she noted some slight numbness in her hand and also swelling in her arm.  Therefore she was added on to have this checked out.  She has a functioning dialysis catheter.  She dialyzes on Tuesdays Thursdays and Saturdays.  The swelling in that right arm has come on gradually.  The numbness appeared this morning.  Her symptoms are mild.  Current Outpatient Medications  Medication Sig Dispense Refill   albuterol (PROVENTIL) (2.5 MG/3ML) 0.083% nebulizer solution Take 3 mLs (2.5 mg total) by nebulization every 6 (six) hours as needed for wheezing or shortness of breath. 75 mL 0   Ascorbic Acid (VITAMIN C) 1000 MG tablet Take 1,000 mg by mouth daily.     busPIRone (BUSPAR) 7.5 MG tablet Take 1 tablet (7.5 mg total) by mouth 2 (two) times daily. 60 tablet 0   Cholecalciferol 25 MCG (1000 UT) tablet Take 1,000 Units by mouth daily.     cyanocobalamin (,VITAMIN B-12,) 1000 MCG/ML injection Inject 1,000 mcg into the muscle every 30 (thirty) days. Started 1st of the month     dicyclomine (BENTYL) 10 MG capsule TAKE 1 CAPSULE (10 MG TOTAL) BY MOUTH 2 (TWO) TIMES DAILY AS NEEDED FOR SPASMS. 180 capsule 1   DULoxetine (CYMBALTA) 20 MG capsule Take 1 capsule (20 mg total) by mouth daily. 30 capsule 0   ferrous sulfate 325 (65 FE) MG EC tablet Take 325 mg by mouth daily with breakfast.     furosemide (LASIX) 80 MG tablet Please take 80 mg of Lasix on none HD days on Sunday, Monday, Wednesday and Friday 30 tablet 12   gabapentin (NEURONTIN) 100 MG capsule Take 2 capsules (200 mg total) by mouth at bedtime. (Patient taking differently: Take 200 mg by mouth 2 (two) times daily.) 60 capsule 2   metoprolol succinate  (TOPROL-XL) 50 MG 24 hr tablet Take 1 tablet (50 mg total) by mouth daily. 30 tablet 2   mupirocin ointment (BACTROBAN) 2 % Apply 1 Application topically daily. (Patient taking differently: Apply 1 Application topically daily as needed (ulcers).) 22 g 0   omeprazole (PRILOSEC) 20 MG capsule Take 20 mg by mouth daily.     ondansetron (ZOFRAN) 4 MG tablet Take 1 tablet (4 mg total) by mouth daily as needed for nausea or vomiting. 20 tablet 1   Probiotic Product (PROBIOTIC PO) Take 1 capsule by mouth daily.     prochlorperazine (COMPAZINE) 5 MG tablet Take 1 tablet (5 mg total) by mouth every 6 (six) hours as needed for nausea or vomiting. 30 tablet 0   sevelamer carbonate (RENVELA) 800 MG tablet Take 1 tablet (800 mg total) by mouth 3 (three) times daily with meals. 90 tablet 0   zolpidem (AMBIEN) 5 MG tablet Take 1 tablet (5 mg total) by mouth at bedtime. 30 tablet 0   No current facility-administered medications for this visit.    REVIEW OF SYSTEMS:  [X]  denotes positive finding, [ ]  denotes negative finding Vascular    Leg swelling    Cardiac    Chest pain or chest pressure:    Shortness of breath upon exertion:    Short of breath when lying flat:    Irregular heart rhythm:  Constitutional    Fever or chills:     PHYSICAL EXAM:   Vitals:   01/24/22 1248  BP: (!) 177/85  Pulse: 73  Resp: 20  Temp: 98 F (36.7 C)  SpO2: 98%  Weight: 207 lb (93.9 kg)  Height: 5\' 8"  (1.727 m)    GENERAL: The patient is a well-nourished female, in no acute distress. The vital signs are documented above. CARDIOVASCULAR: There is a regular rate and rhythm. PULMONARY: There is good air exchange bilaterally without wheezing or rales. VASCULAR: She has a good thrill in her fistula in the right upper arm. The incision has some minimal lymphatic drainage.  There is no erythema or evidence of infection. She has mild swelling in the right upper extremity. It was difficult to palpate a radial pulse  because of the swelling.  She has a biphasic ulnar signal with the Doppler, a biphasic palmar arch signal, and a brisk but monophasic dorsalis pedis signal. The hand is warm and well-perfused.  DATA:   STEAL STUDY: Her steal study shows very minimal augmentation of her digital pressure with compression of her fistula.  Digital pressure is 189 mmHg with the fistula open and augments only slightly to 201 mmHg with the fistula compressed.  MEDICAL ISSUES:   S/P RIGHT FIRST STAGE BASILIC VEIN TRANSPOSITION: The patient has very minimal steal symptoms in the right upper extremity.  I encouraged her to exercise her arm and I think this will gradually improve.  Certainly if her symptoms get worse the only option would be ligation of her fistula.  She has mild arm swelling and I encouraged her to elevate her arm.  There is no signs of infection.  She has an appointment scheduled with Dr. Sherren Mocha Early in a couple weeks and she will keep that appointment.  She knows to call sooner if things or not improving.  Tracy Walsh [facility] [phone]

## 2022-01-24 NOTE — Progress Notes (Signed)
Surgical Consultation  01/24/2022  Tracy Walsh is an 56 y.o. female.   Chief Complaint  Patient presents with   New Patient (Initial Visit)    PD cath placement    HPI: Tracy Walsh is seen in consultation at the request of Dr. August Albino for placement of PD catheter. She  Does have a history of diabetes, CAD, hypertension and congestive heart failure.  She did have a right acute basilic fistula performed recently. She is interested in pursuing home dialysis if possible.  He is getting currently dialysis via right IJ. Did have a chest x-ray recently that have personally reviewed showing evidence of some enlarged heart. Did have a recent CT scan 5 months ago of the abdomen and pelvis that I have personally reviewed, no major intra-abdominal pathology was seen .  BC shows chronic anemia hb 8.9, nml PLs, creat 2.6 and K3.2  Past Medical History:  Diagnosis Date   Anemia    Anxiety    Asthma    Bipolar disorder ([place]) 2018   type 2   CHF (congestive heart failure) (HCC)    CIDP (chronic inflammatory demyelinating polyneuropathy) (HCC)    CKD (chronic kidney disease)    COPD (chronic obstructive pulmonary disease) (HCC)    Depression    Diabetes mellitus without complication (HCC)    type 2   Diabetic foot ulcers (HCC)    Fibromyalgia    GERD (gastroesophageal reflux disease)    Hepatitis    Hep as a child - patient can give blood   History of blood transfusion 08/2021   1 unit per patient   History of esophagogastroduodenoscopy (EGD) 2018   History of kidney stones    passed stones   HLD (hyperlipidemia)    Hypertension    Migraines    Myocardial infarction (HCC)    x 2   Neuropathy    legs and feet - bilateral    Past Surgical History:  Procedure Laterality Date   ABDOMINAL HYSTERECTOMY     APPENDECTOMY     AV FISTULA PLACEMENT Right 01/15/2022   Procedure: RIGHT ARM ARTERIOVENOUS (AV) FISTULA CREATION;  Surgeon: Rosetta Posner, MD;  Location: [facility];  Service: Vascular;   Laterality: Right;   BILATERAL NASAL FRACTURE CLOSED REDUCTION  10/2020   CHOLECYSTECTOMY     COLONOSCOPY     CORONARY ANGIOPLASTY WITH STENT PLACEMENT  01/15/2021   FOOT SURGERY Right    I & D   I & D EXTREMITY Left 09/28/2021   Procedure: LEFT HEEL DEBRIDEMENT AND TISSUE GRAFT;  Surgeon: Newt Minion, MD;  Location: [place];  Service: Orthopedics;  Laterality: Left;   IR FLUORO GUIDE CV LINE RIGHT  12/20/2021   IR US GUIDE VASC ACCESS RIGHT  12/20/2021   SHOULDER SURGERY Bilateral    SPINAL FUSION     UPPER GI ENDOSCOPY      History reviewed. No pertinent family history.  Social History:  reports that she has been smoking cigarettes. She has been smoking an average of .5 packs per day. She has never used smokeless tobacco. She reports that she does not drink alcohol and does not use drugs.  Allergies:  Allergies  Allergen Reactions   Amoxicillin Anaphylaxis   Clarithromycin Anaphylaxis, Diarrhea, Itching, Nausea And Vomiting and Rash   Codeine Itching   Hydrocodone Itching   Moxifloxacin Anaphylaxis, Anxiety, Hives, Itching, Nausea And Vomiting, Palpitations and Shortness Of Breath   Penicillins Anaphylaxis   Sulfa Antibiotics Anaphylaxis   Nitrofurantoin Nausea And  Vomiting   Crab (Diagnostic) Hives   Latex Hives, Itching, Rash and Swelling    Medications reviewed.   ROS Full ROS performed and is otherwise negative other than what is stated in the HPI   BP (!) 152/72   Pulse 73   Temp 98.3 F (36.8 C) (Oral)   Ht 5\' 8"  (1.727 m)   Wt 207 lb (93.9 kg)   SpO2 94%   BMI 31.47 kg/m   Physical Exam Vitals and nursing note reviewed. Exam conducted with a chaperone present.  Constitutional:      General: She is not in acute distress.    Appearance: Normal appearance. She is not ill-appearing.  Cardiovascular:     Rate and Rhythm: Normal rate and regular rhythm.  Pulmonary:     Effort: Pulmonary effort is normal. No respiratory distress.     Breath sounds: Normal  breath sounds. No stridor.  Abdominal:     General: Abdomen is flat. There is no distension.     Palpations: Abdomen is soft. There is no mass.     Tenderness: There is no abdominal tenderness. There is no guarding or rebound.     Hernia: No hernia is present.  Musculoskeletal:        General: No swelling or tenderness. Normal range of motion.     Cervical back: Normal range of motion and neck supple. No rigidity or tenderness.     Comments: Evidence of right brachiobasilic fistula with good thrill.  No evidence of infection  Skin:    General: Skin is warm and dry.     Capillary Refill: Capillary refill takes less than 2 seconds.  Neurological:     General: No focal deficit present.     Mental Status: She is alert and oriented to person, place, and time.  Psychiatric:        Mood and Affect: Mood normal.        Behavior: Behavior normal.        Thought Content: Thought content normal.        Judgment: Judgment normal.    Assessment/Plan: 56 year old female with end-stage renal disease in need for dialysis.  Discussed with the patient in detail about different modalities for dialysis to include hemodialysis and peritoneal route.  She seems to be very interested in being dependent and performing dialysis at her house.  SHe Does have already a fistula and she is thinking about letting it mature and use it as a backup. Discussion regarding the peritoneal dialysis set up and they proceeded to place a peritoneal dialysis catheter laparoscopically.  The risks were explained to her, benefits and possible applications including but not limited to: Bleeding, infection catheter malfunction, bowel injuries, she understands.  She is ready to move forward with peritoneal dialysis catheter.  She has already inquire with [facility] about further teaching. We will plan to perform Lap PD cath at her convinience. I spent 55 minutes this encounter including personally reviewing imaging studies, placing orders,  coordinating her care and performing appropriate documentation A copy of this report was sent to the referring provider   Caroleen Hamman, MD [facility] General Surgeon

## 2022-01-25 ENCOUNTER — Ambulatory Visit (INDEPENDENT_AMBULATORY_CARE_PROVIDER_SITE_OTHER): Payer: 59 | Admitting: Internal Medicine

## 2022-01-25 ENCOUNTER — Other Ambulatory Visit: Payer: Self-pay | Admitting: Internal Medicine

## 2022-01-25 ENCOUNTER — Telehealth (INDEPENDENT_AMBULATORY_CARE_PROVIDER_SITE_OTHER): Payer: 59 | Admitting: Psychiatry

## 2022-01-25 ENCOUNTER — Encounter (HOSPITAL_COMMUNITY): Payer: Self-pay | Admitting: Psychiatry

## 2022-01-25 ENCOUNTER — Encounter: Payer: Self-pay | Admitting: Internal Medicine

## 2022-01-25 VITALS — BP 182/76 | HR 71 | Ht 68.0 in | Wt 210.4 lb

## 2022-01-25 DIAGNOSIS — I10 Essential (primary) hypertension: Secondary | ICD-10-CM

## 2022-01-25 DIAGNOSIS — F411 Generalized anxiety disorder: Secondary | ICD-10-CM | POA: Diagnosis not present

## 2022-01-25 DIAGNOSIS — E1165 Type 2 diabetes mellitus with hyperglycemia: Secondary | ICD-10-CM

## 2022-01-25 DIAGNOSIS — F331 Major depressive disorder, recurrent, moderate: Secondary | ICD-10-CM

## 2022-01-25 DIAGNOSIS — Z90710 Acquired absence of both cervix and uterus: Secondary | ICD-10-CM | POA: Diagnosis not present

## 2022-01-25 DIAGNOSIS — N184 Chronic kidney disease, stage 4 (severe): Secondary | ICD-10-CM

## 2022-01-25 DIAGNOSIS — G4709 Other insomnia: Secondary | ICD-10-CM

## 2022-01-25 DIAGNOSIS — R69 Illness, unspecified: Secondary | ICD-10-CM | POA: Diagnosis not present

## 2022-01-25 DIAGNOSIS — G6181 Chronic inflammatory demyelinating polyneuritis: Secondary | ICD-10-CM

## 2022-01-25 DIAGNOSIS — Z720 Tobacco use: Secondary | ICD-10-CM | POA: Diagnosis not present

## 2022-01-25 DIAGNOSIS — F410 Panic disorder [episodic paroxysmal anxiety] without agoraphobia: Secondary | ICD-10-CM

## 2022-01-25 DIAGNOSIS — G4700 Insomnia, unspecified: Secondary | ICD-10-CM | POA: Insufficient documentation

## 2022-01-25 DIAGNOSIS — G4730 Sleep apnea, unspecified: Secondary | ICD-10-CM

## 2022-01-25 DIAGNOSIS — F339 Major depressive disorder, recurrent, unspecified: Secondary | ICD-10-CM | POA: Insufficient documentation

## 2022-01-25 HISTORY — DX: Sleep apnea, unspecified: G47.30

## 2022-01-25 LAB — GLUCOSE, POCT (MANUAL RESULT ENTRY): POC Glucose: 310 mg/dl — AB (ref 70–99)

## 2022-01-25 MED ORDER — BLOOD GLUCOSE METER KIT: PACK | 0 refills | Status: AC

## 2022-01-25 MED ORDER — ISOSORBIDE MONONITRATE ER 30 MG PO TB24: 30.0000 mg | ORAL_TABLET | Freq: Every day | ORAL | 0 refills | Status: DC

## 2022-01-25 MED ORDER — LANTUS SOLOSTAR 100 UNIT/ML ~~LOC~~ SOPN: 10.0000 [IU] | PEN_INJECTOR | Freq: Every day | SUBCUTANEOUS | 99 refills | Status: DC

## 2022-01-25 MED ORDER — GVOKE HYPOPEN 2-PACK 1 MG/0.2ML ~~LOC~~ SOAJ: 1.0000 mg | SUBCUTANEOUS | 0 refills | Status: DC | PRN

## 2022-01-25 MED ORDER — SERTRALINE HCL 50 MG PO TABS: ORAL_TABLET | ORAL | 0 refills | Status: DC

## 2022-01-25 NOTE — Patient Instructions (Signed)
We added zoloft to your medication regimen, take 25mg  once daily for now. Here are some worksheets you can begin to use for CBT for insomnia: TheyConnect.es  Dr. Nehemiah Settle has placed a referral for psychotherapy through the clinic. He will also message your primary care provider about getting a sleep medicine referral to get your CPAP renewed.

## 2022-01-25 NOTE — Progress Notes (Signed)
Psychiatric Initial Adult Assessment  Patient Identification: Tracy Walsh MRN:  [id number] Date of Evaluation:  01/25/2022 Referral Source: PCP  Assessment:  Tracy Walsh is a 56 y.o. 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  Patient reports worsening depression, insomnia, anxiety, and panic attacks with tapering off of her medication in the setting of finding chronic kidney disease stage 4 requiring dialysis. Some mood worsening is expected with the cessation of nearly all prior chronic home medication. However, the added stress of facing her own mortality is exacerbating her underlying MDD, GAD and insomnia. Options are overall very limited in what can be prescribed in this case. Will defer management of gabapentin to neurology for use in CIDP. That said, cautious titration could be considered for night dosing to aid with insomnia. With prior long term use of ambien, it will take some time for sleep architecture to return to baseline and would avoid long term use of ambien generally. If she was able to get re-eval for sleep apnea, this would likely aid in the insomnia through a non-medication. SSRIs are generally safer than other antidepressants in CKD due to primarily liver clearance but patient had relative minimal response to these in the past. Will still try zoloft as next retrial. Have encouraged non medication interventions for panic as most of the anxiolytics require renal clearance so would not be the best option. Will follow up in 1 month.  Plan:  # MDD recurrent, moderate  Generalized anxiety disorder with panic attacks Past medication trials: wellbutrin, cymbalta  (ineffective), buspar, aripiprazole, zoloft, fluoxetine, alprazolam, lexapro,  effexor (worked).  Status of problem: new to provider Interventions: -- start zoloft 25mg  once daily (s10/13/23) -- referral for CBT  # Insomnia  Sleep apnea Past medication trials: trazodone (worked), [employer] Status of problem: new to provider Interventions: -- CBT-I -- sleep medicine referral to get new functioning CPAP -- defer titration of gabapentin   # Tobacco use disorder Past medication trials: chantix, wellbutrin, NRT Status of problem: new to provider Interventions: -- contemplative stage of change -- tobacco cessation counseling provided  # Stage 4 kidney disease on dialysis 2/2 type 2 diabetes mellitus Past medication trials:  Status of problem: new to provider Interventions: -- will need careful monitoring of renally excreted medication, SSRIs generally with safer profile as compared to SNRI  # CIDP Past medication trials: gabapentin Status of problem: new to provider Interventions: -- continue gabapentin 200mg  nightly as written by outside provider. Defer management to primary prescriber  Patient was given contact information for behavioral health clinic and was instructed to call 911 for emergencies.   Subjective:  Chief Complaint: No chief complaint on file.   History of Present Illness:  Doctor wanted her to see psychiatry to get medications straight. Has been weaned off medications for depression and anxiety. Just found out that she has stage 4 kidney disease. Is on dialysis. From diabetes. May pursue kidney transplant but still all very new; working on getting home dialysis. Highest dose of cymbalta was 60mg  and wasn't doing much for depression anxiety. Buspar may have been somewhat helpful.  Lives with husband and 1 dog and everyone gets along. Haven't done much for fun of late, likes to read,  do crafts, going to concerts. Last 2 months has been full of dialysis appointments.  Not enjoying much of anything. Has been down and thinking about her mortality. Worrying about what family will do and how husband will handle it. Lack of control over it is killing her. No thoughts of wanting to harm self, says that body is doing that for her. Still wants to live and finds meaning in life. Did have worse SI when a teenager but no attempts. Guilt feelings around being better mother and wife. Concentration is poor but is relatively new from being in own head. Physically slowed. Appetite is low, stomach issues with kidney disease. No weight loss due to fluid accumulation. No binges. No purging or restricting. Off Ambien so not sleeping. Trouble staying asleep, slept two hours last night. Has been off ambien for a week, previously on for one year. Just moved to [place] one year ago, has sleep apnea but CPAP machine isn't functional at present. Has noticed jaw movements, think it is related to having new dentures.   Is chronic worrier around family, health, and before doing anything new. Notes a lot of anxiety. Can't drive anymore. Feels like a burden to husband. Longest sleeplessness is 2 days but felt strong need to sleep. Has happened more than once but no regularity to it. No excess spending, has chronic project starting even outside of poor sleep, no hypersexuality. No hallucinations. No paranoia. Has experienced verbal trauma for entire childhood. Memories do pop up frequently. On high alert in public. Avoiding behavior.  No alcohol past or present. Smokes 0.5ppd now, previously 2 packs per day. Chantix helped her quit previously, wellbutrin didn't do anything, adhesive on patches is allergic, gum didn't do much. No other drugs.   Past Psychiatric History:  Diagnoses: anxiety (age 13), bipolar manic depressive (age 60) Medication trials: wellbutrin, ambien, cymbalta, buspar, aripiprazole, zoloft, fluoxetine, alprazolam, lexapro, trazodone (worked), [employer] techn[employer] (worked). Most worked for a time then  stopped working Previous psychiatrist/therapist: yes, not currently Hospitalizations: none Suicide attempts: none SIB: none Hx of violence towards others: none Current access to guns: yes, in gun safe. Handguns, rifles, shotguns Hx of abuse: verbal abuse throughout childhood  Previous Psychotropic Medications: Yes   Substance Abuse History in the last 12 months:  No.  Past Medical History:  Past Medical History:  Diagnosis Date   Anemia    Anxiety    Asthma    Bipolar disorder ([place]) 2018   type 2   CHF (congestive heart failure) (HCC)    CIDP (chronic inflammatory demyelinating polyneuropathy) (HCC)    CKD (chronic kidney disease)    COPD (chronic obstructive pulmonary disease) ([place])    Depression    Depression with anxiety 12/16/2021   Depression, recurrent ([place]) 12/07/2021   Diabetes mellitus without complication (HCC)    type 2   Diabetic foot ulcers (HCC)    Fibromyalgia    GERD (gastroesophageal reflux disease)    Hepatitis    Hep as a child - patient can give blood   History of blood transfusion 08/2021   1 unit per patient   History of esophagogastroduodenoscopy (EGD) 2018   History of kidney stones    passed stones   HLD (hyperlipidemia)    Hypertension    Migraines    Myocardial infarction (HCC)    x 2   Neuropathy    legs and feet - bilateral   Sleep apnea 01/25/2022    Past Surgical History:  Procedure Laterality Date  ABDOMINAL HYSTERECTOMY     APPENDECTOMY     AV FISTULA PLACEMENT Right 01/15/2022   Procedure: RIGHT ARM ARTERIOVENOUS (AV) FISTULA CREATION;  Surgeon: Rosetta Posner, MD;  Location: [facility];  Service: Vascular;  Laterality: Right;   BILATERAL NASAL FRACTURE CLOSED REDUCTION  10/2020   CHOLECYSTECTOMY     COLONOSCOPY     CORONARY ANGIOPLASTY WITH STENT PLACEMENT  01/15/2021   FOOT SURGERY Right    I & D   I & D EXTREMITY Left 09/28/2021   Procedure: LEFT HEEL DEBRIDEMENT AND TISSUE GRAFT;  Surgeon: Newt Minion, MD;  Location: [facility];   Service: Orthopedics;  Laterality: Left;   IR FLUORO GUIDE CV LINE RIGHT  12/20/2021   IR US GUIDE VASC ACCESS RIGHT  12/20/2021   SHOULDER SURGERY Bilateral    SPINAL FUSION     UPPER GI ENDOSCOPY      Family Psychiatric History: will assess at next visit  Family History: No family history on file.  Social History:   Social History   Socioeconomic History   Marital status: Married    Spouse name: Not on file   Number of children: Not on file   Years of education: Not on file   Highest education level: Not on file  Occupational History   Not on file  Tobacco Use   Smoking status: Every Day    Packs/day: 0.50    Types: Cigarettes   Smokeless tobacco: Never  Vaping Use   Vaping Use: Never used  Substance and Sexual Activity   Alcohol use: Never   Drug use: Never   Sexual activity: Yes    Birth control/protection: Surgical    Comment: Hysterectomy  Other Topics Concern   Not on file  Social History Narrative   Not on file   Social Determinants of Health   Financial Resource Strain: Not on file  Food Insecurity: Not on file  Transportation Needs: Not on file  Physical Activity: Not on file  Stress: Not on file  Social Connections: Not on file    Additional Social History: see HPI  Allergies:   Allergies  Allergen Reactions   Amoxicillin Anaphylaxis   Clarithromycin Anaphylaxis, Diarrhea, Itching, Nausea And Vomiting and Rash   Codeine Itching   Hydrocodone Itching   Moxifloxacin Anaphylaxis, Anxiety, Hives, Itching, Nausea And Vomiting, Palpitations and Shortness Of Breath   Penicillins Anaphylaxis   Sulfa Antibiotics Anaphylaxis   Nitrofurantoin Nausea And Vomiting   Crab (Diagnostic) Hives   Latex Hives, Itching, Rash and Swelling    Current Medications: Current Outpatient Medications  Medication Sig Dispense Refill   sertraline (ZOLOFT) 50 MG tablet Take half a tablet (25mg ) once daily. 30 tablet 0   albuterol (PROVENTIL) (2.5 MG/3ML) 0.083%  nebulizer solution Take 3 mLs (2.5 mg total) by nebulization every 6 (six) hours as needed for wheezing or shortness of breath. 75 mL 0   Ascorbic Acid (VITAMIN C) 1000 MG tablet Take 1,000 mg by mouth daily.     Cholecalciferol 25 MCG (1000 UT) tablet Take 1,000 Units by mouth daily.     cyanocobalamin (,VITAMIN B-12,) 1000 MCG/ML injection Inject 1,000 mcg into the muscle every 30 (thirty) days. Started 1st of the month     dicyclomine (BENTYL) 10 MG capsule TAKE 1 CAPSULE (10 MG TOTAL) BY MOUTH 2 (TWO) TIMES DAILY AS NEEDED FOR SPASMS. 180 capsule 1   ferrous sulfate 325 (65 FE) MG EC tablet Take 325 mg by mouth daily with breakfast.  furosemide (LASIX) 80 MG tablet Please take 80 mg of Lasix on none HD days on Sunday, Monday, Wednesday and Friday 30 tablet 12   gabapentin (NEURONTIN) 100 MG capsule Take 2 capsules (200 mg total) by mouth at bedtime. (Patient taking differently: Take 200 mg by mouth 2 (two) times daily.) 60 capsule 2   metoprolol succinate (TOPROL-XL) 50 MG 24 hr tablet Take 1 tablet (50 mg total) by mouth daily. 30 tablet 2   mupirocin ointment (BACTROBAN) 2 % Apply 1 Application topically daily. (Patient taking differently: Apply 1 Application topically daily as needed (ulcers).) 22 g 0   omeprazole (PRILOSEC) 20 MG capsule Take 20 mg by mouth daily.     ondansetron (ZOFRAN) 4 MG tablet Take 1 tablet (4 mg total) by mouth daily as needed for nausea or vomiting. 20 tablet 1   prochlorperazine (COMPAZINE) 5 MG tablet Take 1 tablet (5 mg total) by mouth every 6 (six) hours as needed for nausea or vomiting. 30 tablet 0   sevelamer carbonate (RENVELA) 800 MG tablet Take 1 tablet (800 mg total) by mouth 3 (three) times daily with meals. 90 tablet 0   No current facility-administered medications for this visit.    ROS: Review of Systems  Constitutional:  Positive for appetite change. Negative for unexpected weight change.  Neurological:  Positive for weakness.       Nerve pain   Psychiatric/Behavioral:  Positive for decreased concentration, dysphoric mood and sleep disturbance. Negative for self-injury and suicidal ideas. The patient is nervous/anxious.     Objective:  Psychiatric Specialty Exam: There were no vitals taken for this visit.There is no height or weight on file to calculate BMI.  General Appearance: Casual, Fairly Groomed, Neat, and wearing glasses. Appears stated age  Eye Contact:  Good  Speech:  Clear and Coherent and Normal Rate  Volume:  Normal  Mood:   "more depression and anxiety are worse"  Affect:  Appropriate, Congruent, and Depressed  Thought Content: Logical and Hallucinations: None   Suicidal Thoughts:  No  Homicidal Thoughts:  No  Thought Process:  Coherent, Goal Directed, and Linear  Orientation:  Full (Time, Place, and Person)    Memory:  Immediate;   Good Recent;   Fair Remote;   Fair  Judgment:  Fair  Insight:  Fair  Concentration:  Concentration: Fair and Attention Span: Fair  Recall:  Good  Fund of Knowledge: Good  Language: Good  Psychomotor Activity:  Decreased  Akathisia:  No  AIMS (if indicated): not done due to limitations of patient using camera phone, noted jaw movements  Assets:  Communication Skills Desire for Improvement Financial Resources/Insurance Housing Intimacy Leisure Time Resilience Social Support Talents/Skills  ADL's:  Intact  Cognition: WNL  Sleep:  Poor   PE: General: sits comfortably in view of camera; no acute distress  Pulm: no increased work of breathing on room air  MSK: all extremity movements appear intact  Neuro: no focal neurological deficits observed. Consistent extra jaw movements noted throughout interview [place]: unable to assess by video    Metabolic Disorder Labs: Lab Results  Component Value Date   HGBA1C 6.3 (H) 12/16/2021   MPG 134.11 12/16/2021   MPG 162.81 08/30/2021   No results found for: "PROLACTIN" No results found for: "CHOL", "TRIG", "HDL",  "CHOLHDL", "VLDL", "LDLCALC" No results found for: "TSH"  Therapeutic Level Labs: No results found for: "LITHIUM" No results found for: "CBMZ" No results found for: "VALPROATE"  Screenings:  GYK5-9  [place] Office Visit from 01/10/2022 in [facility] Office Visit from 12/28/2021 in [facility] Office Visit from 12/07/2021 in [facility]  PHQ-2 Total Score 2 2 2   PHQ-9 Total Score 10 10 --      Flowsheet Row ED to Hosp-Admission (Discharged) from 12/16/2021 in [facility] Admission (Discharged) from 09/28/2021 in [facility] ED to Hosp-Admission (Discharged) from 08/29/2021 in [facility] No Risk No Risk No Risk       Collaboration of Care: Collaboration of Care: Primary Care Provider AEB referral for sleep medicine (sleep study for CPAP) and management of gabapentin and Referral or follow-up with counselor/therapist AEB CBT  Patient/Guardian was advised Release of Information must be obtained prior to any record release in order to collaborate their care with an outside provider. Patient/Guardian was advised if they have not already done so to contact the registration department to sign all necessary forms in order for [place] to release information regarding their care.   Consent: Patient/Guardian gives verbal consent for treatment and assignment of benefits for services provided during this visit. Patient/Guardian expressed understanding and agreed to proceed.   Televisit via video: I connected with Tracy Walsh on 01/25/22 at  8:00 AM EDT by a video enabled telemedicine application and verified that I am speaking with the correct person using two identifiers.  Location: Patient: home in [place] Provider: home office   I discussed the limitations of evaluation and management by telemedicine and the availability of in person appointments. The patient expressed  understanding and agreed to proceed.  I discussed the assessment and treatment plan with the patient. The patient was provided an opportunity to ask questions and all were answered. The patient agreed with the plan and demonstrated an understanding of the instructions.   The patient was advised to call back or seek an in-person evaluation if the symptoms worsen or if the condition fails to improve as anticipated.  I provided 60 minutes of non-face-to-face time during this encounter.  Jacquelynn Cree, MD 10/13/202310:33 AM

## 2022-01-25 NOTE — Patient Instructions (Signed)
It was a pleasure to see you today.  Thank you for giving [place] the opportunity to be involved in your care.  Below is a brief recap of your visit and next steps.  We will plan to see you again in 1 week (before Friday)  Summary I have added Imdur 30 mg daily for high blood pressure Start lantus 10 units daily for blood sugar Follow up next week for BP check and diabetes check

## 2022-01-25 NOTE — Progress Notes (Unsigned)
Established Patient Office Visit  Subjective   Patient ID: Tracy Walsh, female    DOB: February 04, 1966  Age: 56 y.o. MRN: [id number]  Chief Complaint  Patient presents with   Follow-up   Tracy Walsh returns to care today.  She is a 56 year old woman with a past medical history significant for ESRD on HD, CAD, systolic HF, HTN, IDA, V6HM, CIDP, depression, diabetic foot ulcers, and current tobacco use.  She was last seen by me on 9/28 for an acute visit for evaluation of bilateral lower extremity pain.  Examination revealed findings concerning for cellulitis and she was treated with appropriate antibiotics.  In the interim, she has undergone right brachiobasilic fistula creation.  She has also been seen by general surgery to discuss PD catheter placement.  Today Tracy Walsh endorses fatigue.  She states that she is planning to undergo PD catheter placement next Friday (02/01/2022) and needs medical clearance.  She has been taking her medications as prescribed.  Tracy Walsh recently established care with psychiatry.  Several of her medications were either discontinued or dose is lowered because of ESRD.  She is concerned about her ability to sleep because of this.  Acute concerns, chronic medical conditions, and outstanding preventative healthcare maintenance items discussed today are individually addressed in A/P below.  Past Medical History:  Diagnosis Date   Anemia    Anxiety    Asthma    Bipolar disorder ([place]) 2018   type 2   CHF (congestive heart failure) (HCC)    CIDP (chronic inflammatory demyelinating polyneuropathy) (HCC)    CKD (chronic kidney disease)    COPD (chronic obstructive pulmonary disease) ([place])    Depression    Depression with anxiety 12/16/2021   Depression, recurrent ([place]) 12/07/2021   Diabetes mellitus without complication (HCC)    type 2   Diabetic foot ulcers (HCC)    Fibromyalgia    GERD (gastroesophageal reflux disease)    Hepatitis    Hep as a child - patient can give  blood   History of blood transfusion 08/2021   1 unit per patient   History of esophagogastroduodenoscopy (EGD) 2018   History of kidney stones    passed stones   HLD (hyperlipidemia)    Hypertension    Migraines    Myocardial infarction (HCC)    x 2   Neuropathy    legs and feet - bilateral   Sleep apnea 01/25/2022   Past Surgical History:  Procedure Laterality Date   APPENDECTOMY     AV FISTULA PLACEMENT Right 01/15/2022   Procedure: RIGHT ARM ARTERIOVENOUS (AV) FISTULA CREATION;  Surgeon: Rosetta Posner, MD;  Location: [facility];  Service: Vascular;  Laterality: Right;   BILATERAL NASAL FRACTURE CLOSED REDUCTION  10/2020   CHOLECYSTECTOMY     COLONOSCOPY     CORONARY ANGIOPLASTY WITH STENT PLACEMENT  01/15/2021   FOOT SURGERY Right    I & D   I & D EXTREMITY Left 09/28/2021   Procedure: LEFT HEEL DEBRIDEMENT AND TISSUE GRAFT;  Surgeon: Newt Minion, MD;  Location: [place];  Service: Orthopedics;  Laterality: Left;   IR FLUORO GUIDE CV LINE RIGHT  12/20/2021   IR US GUIDE VASC ACCESS RIGHT  12/20/2021   SHOULDER SURGERY Bilateral    SPINAL FUSION     TOTAL ABDOMINAL HYSTERECTOMY  1995   UPPER GI ENDOSCOPY     Social History   Tobacco Use   Smoking status: Every Day    Packs/day: 0.50  Types: Cigarettes   Smokeless tobacco: Never  Vaping Use   Vaping Use: Never used  Substance Use Topics   Alcohol use: Never   Drug use: Never   No family history on file. Allergies  Allergen Reactions   Amoxicillin Anaphylaxis   Clarithromycin Anaphylaxis, Diarrhea, Itching, Nausea And Vomiting and Rash   Codeine Itching   Hydrocodone Itching   Moxifloxacin Anaphylaxis, Anxiety, Hives, Itching, Nausea And Vomiting, Palpitations and Shortness Of Breath   Penicillins Anaphylaxis   Sulfa Antibiotics Anaphylaxis   Nitrofurantoin Nausea And Vomiting   Crab (Diagnostic) Hives   Latex Hives, Itching, Rash and Swelling   Review of Systems  Constitutional:  Positive for  malaise/fatigue.  Cardiovascular:  Positive for leg swelling.  All other systems reviewed and are negative.     Objective:     BP (!) 182/76   Pulse 71   Ht 5\' 8"  (1.727 m)   Wt 210 lb 6.4 oz (95.4 kg)   SpO2 96%   BMI 31.99 kg/m  BP Readings from Last 3 Encounters:  01/25/22 (!) 182/76  01/24/22 (!) 177/85  01/21/22 (!) 152/72      Physical Exam Vitals reviewed.  Constitutional:      Comments: Chronically ill-appearing, examined in wheelchair  HENT:     Head: Normocephalic and atraumatic.     Right Ear: External ear normal.     Left Ear: External ear normal.     Nose: Nose normal. No congestion or rhinorrhea.     Mouth/Throat:     Mouth: Mucous membranes are moist.     Pharynx: Oropharynx is clear. No oropharyngeal exudate or posterior oropharyngeal erythema.  Eyes:     General: No scleral icterus.    Extraocular Movements: Extraocular movements intact.     Conjunctiva/sclera: Conjunctivae normal.     Pupils: Pupils are equal, round, and reactive to light.  Cardiovascular:     Rate and Rhythm: Normal rate and regular rhythm.     Pulses: Normal pulses.     Heart sounds: Murmur heard.  Pulmonary:     Effort: Pulmonary effort is normal.     Breath sounds: Normal breath sounds. No wheezing, rhonchi or rales.  Abdominal:     General: Abdomen is flat. Bowel sounds are normal. There is no distension.     Palpations: Abdomen is soft.     Tenderness: There is no abdominal tenderness.  Musculoskeletal:        General: Swelling (2+ bilateral lower extremity edema) present.     Cervical back: Normal range of motion.     Right lower leg: Edema present.     Left lower leg: Edema present.  Lymphadenopathy:     Cervical: No cervical adenopathy.  Skin:    General: Skin is warm and dry.     Capillary Refill: Capillary refill takes less than 2 seconds.     Coloration: Skin is not jaundiced.  Neurological:     General: No focal deficit present.     Mental Status: She is  alert and oriented to person, place, and time.  Psychiatric:        Mood and Affect: Mood normal.        Behavior: Behavior normal.    Last CBC Lab Results  Component Value Date   WBC 9.0 12/28/2021   HGB 8.9 (L) 01/15/2022   HCT 28.5 (L) 01/15/2022   MCV 96 12/28/2021   MCH 30.4 12/28/2021   RDW 14.3 12/28/2021   PLT 172 12/28/2021  Last metabolic panel Lab Results  Component Value Date   GLUCOSE 174 (H) 01/15/2022   NA 138 01/15/2022   K 3.2 (L) 01/15/2022   CL 101 01/15/2022   CO2 28 01/15/2022   BUN 18 01/15/2022   CREATININE 2.61 (H) 01/15/2022   GFRNONAA 21 (L) 01/15/2022   CALCIUM 8.2 (L) 01/15/2022   PHOS 5.5 (H) 12/22/2021   PROT 6.5 12/28/2021   ALBUMIN 3.3 (L) 12/28/2021   LABGLOB 3.2 12/28/2021   AGRATIO 1.0 (L) 12/28/2021   BILITOT <0.2 12/28/2021   ALKPHOS 146 (H) 12/28/2021   AST 20 12/28/2021   ALT 21 12/28/2021   ANIONGAP 9 01/15/2022   Last hemoglobin A1c Lab Results  Component Value Date   HGBA1C 6.3 (H) 12/16/2021   Last vitamin D Lab Results  Component Value Date   VD25OH 28.2 (L) 12/28/2021   Last vitamin B12 and Folate Lab Results  Component Value Date   VITAMINB12 1,079 (H) 09/01/2021   FOLATE 20.5 09/01/2021     Assessment & Plan:   Problem List Items Addressed This Visit     Essential hypertension - Primary    BP 181/76 today.  She is currently prescribed metoprolol succinate 50 mg daily.  She has a recent history of hypotension and her previous medications were discontinued. -Restart Imdur 30 mg daily today.  Follow-up in 1 week for repeat BP check      Uncontrolled type 2 diabetes mellitus with hyperglycemia, without long-term current use of insulin (HCC)    Not currently on any medications for diabetes in the setting of recent admission for severe hypoglycemia.  She was previously on glimepiride and this was discontinued.  POC glucose in office today is 300. -We will start basal insulin, Lantus 10 units daily today -I  have also ordered a glucometer for her to check her blood sugar at home -Follow-up in 1 week      Major depressive disorder, recurrent episode [facility])    She has recently established care with psychiatry.       Return in about 1 week (around 02/01/2022).    [name], MD

## 2022-01-28 ENCOUNTER — Telehealth: Payer: Self-pay | Admitting: Internal Medicine

## 2022-01-28 ENCOUNTER — Other Ambulatory Visit: Payer: Self-pay

## 2022-01-28 ENCOUNTER — Encounter
Admission: RE | Admit: 2022-01-28 | Discharge: 2022-01-28 | Disposition: A | Discharge status: Home / Self Care | Source: Ambulatory Visit | Attending: Surgery | Admitting: Surgery

## 2022-01-28 ENCOUNTER — Other Ambulatory Visit: Payer: Self-pay | Admitting: Internal Medicine

## 2022-01-28 VITALS — Ht 68.0 in | Wt 206.0 lb

## 2022-01-28 DIAGNOSIS — I10 Essential (primary) hypertension: Secondary | ICD-10-CM

## 2022-01-28 DIAGNOSIS — N184 Chronic kidney disease, stage 4 (severe): Secondary | ICD-10-CM

## 2022-01-28 DIAGNOSIS — E1165 Type 2 diabetes mellitus with hyperglycemia: Secondary | ICD-10-CM

## 2022-01-28 MED ORDER — ISOSORBIDE MONONITRATE ER 30 MG PO TB24: 30.0000 mg | ORAL_TABLET | Freq: Every day | ORAL | 0 refills | Status: DC

## 2022-01-28 MED ORDER — LANTUS SOLOSTAR 100 UNIT/ML ~~LOC~~ SOPN: 10.0000 [IU] | PEN_INJECTOR | Freq: Every day | SUBCUTANEOUS | 99 refills | Status: DC

## 2022-01-28 NOTE — Patient Instructions (Addendum)
Your procedure is scheduled on: Friday February 01, 2022. Report to Day Surgery inside [facility] 2nd floor, stop by registration desk before getting on elevator. To find out your arrival time please call [phone] between 1PM - 3PM on Thursday January 31, 2022.  Remember: Instructions that are not followed completely may result in serious medical risk,  up to and including death, or upon the discretion of your surgeon and anesthesiologist your  surgery may need to be rescheduled.     _X__ 1. Do not eat food after midnight the night before your procedure.                 No chewing gum or hard candies. You may drink clear liquids up to 2 hours                 before you are scheduled to arrive for your surgery- DO not drink clear                 liquids within 2 hours of the start of your surgery.                 Clear Liquids include:  water.  __X__2.  On the morning of surgery brush your teeth with toothpaste and water, you                may rinse your mouth with mouthwash if you wish.  Do not swallow any toothpaste or mouthwash.     _X__ 3.  No Alcohol for 24 hours before or after surgery.   _X__ 4.  Do Not Smoke or use e-cigarettes For 24 Hours Prior to Your Surgery.                 Do not use any chewable tobacco products for at least 6 hours prior to                 Surgery.  _X__  5.  Do not use any recreational drugs (marijuana, cocaine, heroin, ecstasy, MDMA or other)                For at least one week prior to your surgery.  Combination of these drugs with anesthesia                May have life threatening results.  ____  6.  Bring all medications with you on the day of surgery if instructed.   __X__  7.  Notify your doctor if there is any change in your medical condition      (cold, fever, infections).     Do not wear jewelry, make-up, hairpins, clips or nail polish. Do not wear lotions, powders, or perfumes. You may wear  deodorant. Do not shave 48 hours prior to surgery. Men may shave face and neck. Do not bring valuables to the hospital.    [facility] is not responsible for any belongings or valuables.  Contacts, dentures or bridgework may not be worn into surgery. Leave your suitcase in the car. After surgery it may be brought to your room. For patients admitted to the hospital, discharge time is determined by your treatment team.   Patients discharged the day of surgery will not be allowed to drive home.   Make arrangements for someone to be with you for the first 24 hours of your Same Day Discharge.   __X__ Take these medicines the morning of surgery with A SIP OF WATER:  1. isosorbide mononitrate (IMDUR) 30 MG 24 hr  2. metoprolol succinate (TOPROL-XL) 50 MG 24 hr tablet  3. omeprazole (PRILOSEC) 20 MG capsule  4. sertraline (ZOLOFT) 50 MG tablet  5.  6.  ____ Fleet Enema (as directed)   __X__ Use CHG Soap (or wipes) as directed  ____ Use Benzoyl Peroxide Gel as instructed  ____ Use inhalers on the day of surgery  ____ Stop metformin 2 days prior to surgery    __X__ Take 1/2 of usual insulin dose the night before surgery. insulin glargine (LANTUS SOLOSTAR) 100 UNIT/ML Solostar Pen       No insulin the morning of surgery.   ____ Call your PCP, cardiologist, or Pulmonologist if taking Coumadin/Plavix/aspirin and ask when to stop before your surgery.   __X__ One Week prior to surgery- Stop Anti-inflammatories such as Ibuprofen, Aleve, Advil, Motrin, meloxicam (MOBIC), diclofenac, etodolac, ketorolac, Toradol, Daypro, piroxicam, Goody's or BC powders. OK TO USE TYLENOL IF NEEDED   __X__ One week prior to surgery stop supplements until after surgery. Ascorbic Acid (VITAMIN C)    ____ Bring C-Pap to the hospital.    If you have any questions regarding your pre-procedure instructions,  Please call Pre-admit Testing at [phone]

## 2022-01-28 NOTE — Telephone Encounter (Signed)
Patient needs meds sent in to pharm.   Pharm never received med   isosorbide mononitrate (IMDUR) 30 MG 24 hr tablet    insulin glargine (LANTUS SOLOSTAR) 100 UNIT/ML Solostar Pen

## 2022-01-28 NOTE — Telephone Encounter (Signed)
Refills sent and returned call.

## 2022-01-28 NOTE — Progress Notes (Signed)
Received paperwork back from Dr Doren Custard regarding patient's Medical Clearance request. She writes that the patient needs BP under control prior to surgery. She has changed her medication and will recheck her on 01/30/22.

## 2022-01-30 ENCOUNTER — Encounter: Payer: Self-pay | Admitting: Internal Medicine

## 2022-01-30 ENCOUNTER — Ambulatory Visit (INDEPENDENT_AMBULATORY_CARE_PROVIDER_SITE_OTHER): Payer: 59 | Admitting: Internal Medicine

## 2022-01-30 VITALS — BP 150/72 | HR 69 | Ht 68.0 in | Wt 205.8 lb

## 2022-01-30 DIAGNOSIS — I10 Essential (primary) hypertension: Secondary | ICD-10-CM | POA: Diagnosis not present

## 2022-01-30 DIAGNOSIS — E1165 Type 2 diabetes mellitus with hyperglycemia: Secondary | ICD-10-CM

## 2022-01-30 MED ORDER — ISOSORBIDE MONONITRATE ER 30 MG PO TB24: 30.0000 mg | ORAL_TABLET | Freq: Every day | ORAL | 0 refills | Status: DC

## 2022-01-30 MED ORDER — INSULIN PEN NEEDLE 31G X 4 MM MISC: 1.0000 "pen " | 1 refills | Status: DC | PRN

## 2022-01-30 NOTE — Assessment & Plan Note (Addendum)
BP 164/84 initially and improved to 150/72 on repeat.  She is currently taking metoprolol succinate 50 mg daily.  Imdur 30 mg was prescribed last week, however she has not been taking his medication because she did not realize that isosorbide mononitrate and Imdur were the same medication.  She now plans to resume taking Imdur.  No additional changes today.  We will follow-up in 2 weeks for BP check.

## 2022-01-30 NOTE — Assessment & Plan Note (Signed)
BP 181/76 today.  She is currently prescribed metoprolol succinate 50 mg daily.  She has a recent history of hypotension and her previous medications were discontinued. -Restart Imdur 30 mg daily today.  Follow-up in 1 week for repeat BP check

## 2022-01-30 NOTE — Assessment & Plan Note (Signed)
Basaglar 10 units daily prescribed last week, however she has not started injecting Basaglar because she needs pen needles.  These were prescribed today.  Of note, she checked her blood sugar this morning and reports that it was 146.  No additional changes today.  Follow-up in 2 weeks for review of recent blood sugars.  I have asked her to keep a blood sugar log in the interim.

## 2022-01-30 NOTE — Assessment & Plan Note (Signed)
She has recently established care with psychiatry.

## 2022-01-30 NOTE — Assessment & Plan Note (Signed)
Not currently on any medications for diabetes in the setting of recent admission for severe hypoglycemia.  She was previously on glimepiride and this was discontinued.  POC glucose in office today is 300. -We will start basal insulin, Lantus 10 units daily today -I have also ordered a glucometer for her to check her blood sugar at home -Follow-up in 1 week

## 2022-01-30 NOTE — Progress Notes (Signed)
Established Patient Office Visit  Subjective   Patient ID: Tracy Walsh, female    DOB: Apr 09, 1966  Age: 56 y.o. MRN: [id number]  Chief Complaint  Patient presents with   Follow-up    HTN   Tracy Walsh returns to care today.  She is a 56 year old woman with a past medical history significant for ESRD on HD, CAD, systolic HF, HTN, IDA, P8EU, CIDP, depression, diabetic foot ulcers, and current tobacco use.  She was last seen by me on 10/13 for follow-up appointment.  She reported that she will undergo PD catheter placement on 10/20 and is in need of medical clearance.  Her blood pressure was significantly elevated and her blood sugar was 300.  Imdur 30 mg daily and Basaglar 10 units daily were prescribed.  1 week follow-up was arranged.  There have been no acute interval events.  Today Tracy Walsh states that she feels well.  She became nauseated earlier with dialysis but otherwise denies acute symptoms or concerns.  She initially believed that she had not received Imdur as recently prescribed, however on further discussion she did not realize that isosorbide mononitrate and Imdur were the same medication.  She has not been taking Imdur but has the medication at home and will restart.  She also states that she is in need of pen needles for Basaglar and has not started injecting [employer].  She has been checking her blood sugar at home and reports that her blood sugar was 146 this morning.  Past Medical History:  Diagnosis Date   Anemia    Anxiety    Asthma    Bipolar disorder ([place]) 2018   type 2   CHF (congestive heart failure) (HCC)    CIDP (chronic inflammatory demyelinating polyneuropathy) (HCC)    CKD (chronic kidney disease)    COPD (chronic obstructive pulmonary disease) ([place])    Depression    Depression with anxiety 12/16/2021   Depression, recurrent ([place]) 12/07/2021   Diabetes mellitus without complication (HCC)    type 2   Diabetic foot ulcers (HCC)    Fibromyalgia    GERD  (gastroesophageal reflux disease)    Hepatitis    Hep as a child - patient can give blood   History of blood transfusion 08/2021   1 unit per patient   History of esophagogastroduodenoscopy (EGD) 2018   History of kidney stones    passed stones   HLD (hyperlipidemia)    Hypertension    Migraines    Myocardial infarction (HCC)    x 2   Neuropathy    legs and feet - bilateral   Sleep apnea 01/25/2022   Past Surgical History:  Procedure Laterality Date   APPENDECTOMY     AV FISTULA PLACEMENT Right 01/15/2022   Procedure: RIGHT ARM ARTERIOVENOUS (AV) FISTULA CREATION;  Surgeon: Rosetta Posner, MD;  Location: [facility];  Service: Vascular;  Laterality: Right;   BILATERAL NASAL FRACTURE CLOSED REDUCTION  10/2020   CHOLECYSTECTOMY     COLONOSCOPY     CORONARY ANGIOPLASTY WITH STENT PLACEMENT  01/15/2021   FOOT SURGERY Right    I & D   I & D EXTREMITY Left 09/28/2021   Procedure: LEFT HEEL DEBRIDEMENT AND TISSUE GRAFT;  Surgeon: Newt Minion, MD;  Location: [place];  Service: Orthopedics;  Laterality: Left;   IR FLUORO GUIDE CV LINE RIGHT  12/20/2021   IR US GUIDE VASC ACCESS RIGHT  12/20/2021   SHOULDER SURGERY Bilateral    SPINAL FUSION  TOTAL ABDOMINAL HYSTERECTOMY  1995   UPPER GI ENDOSCOPY     Social History   Tobacco Use   Smoking status: Every Day    Packs/day: 0.50    Types: Cigarettes   Smokeless tobacco: Never  Vaping Use   Vaping Use: Never used  Substance Use Topics   Alcohol use: Never   Drug use: Never   No family history on file. Allergies  Allergen Reactions   Amoxicillin Anaphylaxis   Clarithromycin Anaphylaxis, Diarrhea, Itching, Nausea And Vomiting and Rash   Codeine Itching   Hydrocodone Itching   Moxifloxacin Anaphylaxis, Anxiety, Hives, Itching, Nausea And Vomiting, Palpitations and Shortness Of Breath   Penicillins Anaphylaxis   Sulfa Antibiotics Anaphylaxis   Nitrofurantoin Nausea And Vomiting   Crab (Diagnostic) Hives   Latex Hives,  Itching, Rash and Swelling   Review of Systems  Constitutional:  Positive for malaise/fatigue. Negative for chills and fever.  HENT:  Negative for sore throat.   Respiratory:  Negative for cough and shortness of breath.   Cardiovascular:  Negative for chest pain, palpitations and leg swelling.  Gastrointestinal:  Negative for abdominal pain, blood in stool, constipation, diarrhea, nausea and vomiting.  Genitourinary:  Negative for dysuria and hematuria.  Musculoskeletal:  Negative for myalgias.  Skin:  Negative for itching and rash.  Neurological:  Negative for dizziness and headaches.  Psychiatric/Behavioral:  Negative for depression and suicidal ideas.      Objective:     BP (!) 150/72 (BP Location: Right Arm, Cuff Size: Normal)   Pulse 69   Ht 5\' 8"  (1.727 m)   Wt 205 lb 12.8 oz (93.4 kg)   SpO2 99%   BMI 31.29 kg/m  BP Readings from Last 3 Encounters:  01/30/22 (!) 150/72  01/25/22 (!) 182/76  01/24/22 (!) 177/85   Physical Exam Vitals reviewed.  HENT:     Head: Normocephalic and atraumatic.     Right Ear: External ear normal.     Left Ear: External ear normal.     Nose: Nose normal. No congestion or rhinorrhea.     Mouth/Throat:     Mouth: Mucous membranes are moist.     Pharynx: Oropharynx is clear. No oropharyngeal exudate or posterior oropharyngeal erythema.  Eyes:     General: No scleral icterus.    Extraocular Movements: Extraocular movements intact.     Conjunctiva/sclera: Conjunctivae normal.     Pupils: Pupils are equal, round, and reactive to light.  Cardiovascular:     Rate and Rhythm: Normal rate and regular rhythm.     Pulses: Normal pulses.     Heart sounds: Murmur heard.  Pulmonary:     Effort: Pulmonary effort is normal.     Breath sounds: Normal breath sounds. No wheezing, rhonchi or rales.  Abdominal:     General: Abdomen is flat. Bowel sounds are normal. There is no distension.     Palpations: Abdomen is soft.     Tenderness: There is no  abdominal tenderness.  Musculoskeletal:        General: Swelling (2+ bilateral lower extremity edema) present.     Cervical back: Normal range of motion.     Right lower leg: Edema present.     Left lower leg: Edema present.  Lymphadenopathy:     Cervical: No cervical adenopathy.  Skin:    General: Skin is warm and dry.     Capillary Refill: Capillary refill takes less than 2 seconds.     Coloration: Skin is not  jaundiced.  Neurological:     General: No focal deficit present.     Mental Status: She is alert and oriented to person, place, and time.  Psychiatric:        Mood and Affect: Mood normal.        Behavior: Behavior normal.    Last CBC Lab Results  Component Value Date   WBC 9.0 12/28/2021   HGB 8.9 (L) 01/15/2022   HCT 28.5 (L) 01/15/2022   MCV 96 12/28/2021   MCH 30.4 12/28/2021   RDW 14.3 12/28/2021   PLT 172 32/03/2481   Last metabolic panel Lab Results  Component Value Date   GLUCOSE 174 (H) 01/15/2022   NA 138 01/15/2022   K 3.2 (L) 01/15/2022   CL 101 01/15/2022   CO2 28 01/15/2022   BUN 18 01/15/2022   CREATININE 2.61 (H) 01/15/2022   GFRNONAA 21 (L) 01/15/2022   CALCIUM 8.2 (L) 01/15/2022   PHOS 5.5 (H) 12/22/2021   PROT 6.5 12/28/2021   ALBUMIN 3.3 (L) 12/28/2021   LABGLOB 3.2 12/28/2021   AGRATIO 1.0 (L) 12/28/2021   BILITOT <0.2 12/28/2021   ALKPHOS 146 (H) 12/28/2021   AST 20 12/28/2021   ALT 21 12/28/2021   ANIONGAP 9 01/15/2022   Last lipids No results found for: "CHOL", "HDL", "LDLCALC", "LDLDIRECT", "TRIG", "CHOLHDL" Last hemoglobin A1c Lab Results  Component Value Date   HGBA1C 6.3 (H) 12/16/2021   Last thyroid functions No results found for: "TSH", "T3TOTAL", "T4TOTAL", "THYROIDAB" Last vitamin D Lab Results  Component Value Date   VD25OH 28.2 (L) 12/28/2021   Last vitamin B12 and Folate Lab Results  Component Value Date   VITAMINB12 1,079 (H) 09/01/2021   FOLATE 20.5 09/01/2021     Assessment & Plan:   Problem  List Items Addressed This Visit       Essential hypertension    BP 164/84 initially and improved to 150/72 on repeat.  She is currently taking metoprolol succinate 50 mg daily.  Imdur 30 mg was prescribed last week, however she has not been taking his medication because she did not realize that isosorbide mononitrate and Imdur were the same medication.  She now plans to resume taking Imdur.  No additional changes today.  We will follow-up in 2 weeks for BP check.      Relevant Medications   isosorbide mononitrate (IMDUR) 30 MG 24 hr tablet   Uncontrolled type 2 diabetes mellitus with hyperglycemia, without long-term current use of insulin (HCC) - Primary    Basaglar 10 units daily prescribed last week, however she has not started injecting Basaglar because she needs pen needles.  These were prescribed today.  Of note, she checked her blood sugar this morning and reports that it was 146.  No additional changes today.  Follow-up in 2 weeks for review of recent blood sugars.  I have asked her to keep a blood sugar log in the interim.      Relevant Medications   Insulin Pen Needle 31G X 4 MM MISC    Return in about 2 weeks (around 02/13/2022).    [name], MD

## 2022-01-30 NOTE — Patient Instructions (Signed)
It was a pleasure to see you today.  Thank you for giving [place] the opportunity to be involved in your care.  Below is a brief recap of your visit and next steps.  We will plan to see you again in 2 weeks.  Summary I recommend resuming Imdur 30 mg daily I have prescribed insulin pen needles for Basaglar. Please take in the morning.  Next steps We will follow up in 2 weeks Good surgery on Friday

## 2022-01-31 ENCOUNTER — Other Ambulatory Visit: Payer: Self-pay | Admitting: *Deleted

## 2022-01-31 DIAGNOSIS — N186 End stage renal disease: Secondary | ICD-10-CM

## 2022-01-31 NOTE — Progress Notes (Signed)
Medical Clearance has been received from Dr Doren Custard. The patient is cleared at [place] for surgery. Other notes are in Epic.

## 2022-02-01 ENCOUNTER — Encounter: Payer: Self-pay | Admitting: Surgery

## 2022-02-01 ENCOUNTER — Ambulatory Visit
Admission: RE | Admit: 2022-02-01 | Discharge: 2022-02-01 | Disposition: A | Discharge status: Home / Self Care | Payer: 59 | Attending: Surgery | Admitting: Surgery

## 2022-02-01 ENCOUNTER — Other Ambulatory Visit: Payer: Self-pay

## 2022-02-01 ENCOUNTER — Ambulatory Visit: Payer: 59 | Admitting: Anesthesiology

## 2022-02-01 ENCOUNTER — Encounter
Admission: RE | Disposition: A | Discharge status: Home / Self Care | Payer: Self-pay | Source: Home / Self Care | Attending: Surgery

## 2022-02-01 DIAGNOSIS — E1165 Type 2 diabetes mellitus with hyperglycemia: Secondary | ICD-10-CM

## 2022-02-01 DIAGNOSIS — Z7984 Long term (current) use of oral hypoglycemic drugs: Secondary | ICD-10-CM | POA: Diagnosis not present

## 2022-02-01 DIAGNOSIS — G4730 Sleep apnea, unspecified: Secondary | ICD-10-CM | POA: Insufficient documentation

## 2022-02-01 DIAGNOSIS — I132 Hypertensive heart and chronic kidney disease with heart failure and with stage 5 chronic kidney disease, or end stage renal disease: Secondary | ICD-10-CM | POA: Insufficient documentation

## 2022-02-01 DIAGNOSIS — Z955 Presence of coronary angioplasty implant and graft: Secondary | ICD-10-CM | POA: Diagnosis not present

## 2022-02-01 DIAGNOSIS — N186 End stage renal disease: Secondary | ICD-10-CM | POA: Insufficient documentation

## 2022-02-01 DIAGNOSIS — E1140 Type 2 diabetes mellitus with diabetic neuropathy, unspecified: Secondary | ICD-10-CM | POA: Diagnosis not present

## 2022-02-01 DIAGNOSIS — Z992 Dependence on renal dialysis: Secondary | ICD-10-CM

## 2022-02-01 DIAGNOSIS — F17210 Nicotine dependence, cigarettes, uncomplicated: Secondary | ICD-10-CM | POA: Diagnosis not present

## 2022-02-01 DIAGNOSIS — E1122 Type 2 diabetes mellitus with diabetic chronic kidney disease: Secondary | ICD-10-CM | POA: Insufficient documentation

## 2022-02-01 DIAGNOSIS — I252 Old myocardial infarction: Secondary | ICD-10-CM | POA: Insufficient documentation

## 2022-02-01 DIAGNOSIS — I509 Heart failure, unspecified: Secondary | ICD-10-CM | POA: Diagnosis not present

## 2022-02-01 DIAGNOSIS — J449 Chronic obstructive pulmonary disease, unspecified: Secondary | ICD-10-CM | POA: Insufficient documentation

## 2022-02-01 DIAGNOSIS — N179 Acute kidney failure, unspecified: Secondary | ICD-10-CM

## 2022-02-01 DIAGNOSIS — I2510 Atherosclerotic heart disease of native coronary artery without angina pectoris: Secondary | ICD-10-CM | POA: Insufficient documentation

## 2022-02-01 DIAGNOSIS — N184 Chronic kidney disease, stage 4 (severe): Secondary | ICD-10-CM

## 2022-02-01 DIAGNOSIS — K219 Gastro-esophageal reflux disease without esophagitis: Secondary | ICD-10-CM | POA: Diagnosis not present

## 2022-02-01 DIAGNOSIS — R69 Illness, unspecified: Secondary | ICD-10-CM | POA: Diagnosis not present

## 2022-02-01 HISTORY — PX: CAPD INSERTION: SHX5233

## 2022-02-01 LAB — POCT I-STAT, CHEM 8
BUN: 12 mg/dL (ref 6–20)
Calcium, Ion: 1.1 mmol/L — ABNORMAL LOW (ref 1.15–1.40)
Chloride: 94 mmol/L — ABNORMAL LOW (ref 98–111)
Creatinine, Ser: 3 mg/dL — ABNORMAL HIGH (ref 0.44–1.00)
Glucose, Bld: 171 mg/dL — ABNORMAL HIGH (ref 70–99)
HCT: 31 % — ABNORMAL LOW (ref 36.0–46.0)
Hemoglobin: 10.5 g/dL — ABNORMAL LOW (ref 12.0–15.0)
Potassium: 3.4 mmol/L — ABNORMAL LOW (ref 3.5–5.1)
Sodium: 137 mmol/L (ref 135–145)
TCO2: 31 mmol/L (ref 22–32)

## 2022-02-01 LAB — GLUCOSE, CAPILLARY: Glucose-Capillary: 171 mg/dL — ABNORMAL HIGH (ref 70–99)

## 2022-02-01 SURGERY — LAPAROSCOPIC INSERTION CONTINUOUS AMBULATORY PERITONEAL DIALYSIS  (CAPD) CATHETER
Anesthesia: General

## 2022-02-01 MED ORDER — OXYCODONE HCL 5 MG PO TABS: 5.0000 mg | ORAL_TABLET | Freq: Once | ORAL | Status: AC | PRN

## 2022-02-01 MED ORDER — OXYCODONE HCL 5 MG/5ML PO SOLN: 5.0000 mg | Freq: Once | ORAL | Status: AC | PRN

## 2022-02-01 MED ORDER — PROPOFOL 10 MG/ML IV BOLUS
INTRAVENOUS | Status: DC | PRN
  Administered 2022-02-01: 100 mg via INTRAVENOUS

## 2022-02-01 MED ORDER — ACETAMINOPHEN 500 MG PO TABS: 1000.0000 mg | ORAL_TABLET | ORAL | Status: AC

## 2022-02-01 MED ORDER — ONDANSETRON HCL 4 MG/2ML IJ SOLN
INTRAMUSCULAR | Status: DC | PRN
  Administered 2022-02-01: 4 mg via INTRAVENOUS

## 2022-02-01 MED ORDER — FENTANYL CITRATE (PF) 100 MCG/2ML IJ SOLN
INTRAMUSCULAR | Status: DC | PRN
  Administered 2022-02-01 (×2): 50 ug via INTRAVENOUS

## 2022-02-01 MED ORDER — PROPOFOL 10 MG/ML IV BOLUS
INTRAVENOUS | Status: AC
  Filled 2022-02-01: qty 20

## 2022-02-01 MED ORDER — OXYCODONE HCL 5 MG PO TABS
ORAL_TABLET | ORAL | Status: AC
  Administered 2022-02-01: 5 mg via ORAL
  Filled 2022-02-01: qty 1

## 2022-02-01 MED ORDER — LIDOCAINE HCL (CARDIAC) PF 100 MG/5ML IV SOSY
PREFILLED_SYRINGE | INTRAVENOUS | Status: DC | PRN
  Administered 2022-02-01: 80 mg via INTRAVENOUS

## 2022-02-01 MED ORDER — HEPARIN SODIUM (PORCINE) 10000 UNIT/ML IJ SOLN
INTRAMUSCULAR | Status: AC
  Filled 2022-02-01: qty 1

## 2022-02-01 MED ORDER — BUPIVACAINE LIPOSOME 1.3 % IJ SUSP
INTRAMUSCULAR | Status: DC | PRN
  Administered 2022-02-01: 20 mL

## 2022-02-01 MED ORDER — GABAPENTIN 300 MG PO CAPS
ORAL_CAPSULE | ORAL | Status: AC
  Administered 2022-02-01: 300 mg via ORAL
  Filled 2022-02-01: qty 1

## 2022-02-01 MED ORDER — SUGAMMADEX SODIUM 200 MG/2ML IV SOLN
INTRAVENOUS | Status: DC | PRN
  Administered 2022-02-01: 400 mg via INTRAVENOUS

## 2022-02-01 MED ORDER — ONDANSETRON HCL 4 MG/2ML IJ SOLN
INTRAMUSCULAR | Status: AC
  Filled 2022-02-01: qty 2

## 2022-02-01 MED ORDER — VANCOMYCIN HCL IN DEXTROSE 1-5 GM/200ML-% IV SOLN: 1000.0000 mg | INTRAVENOUS | Status: DC

## 2022-02-01 MED ORDER — FENTANYL CITRATE (PF) 100 MCG/2ML IJ SOLN
INTRAMUSCULAR | Status: AC
  Administered 2022-02-01: 25 ug via INTRAVENOUS
  Filled 2022-02-01: qty 2

## 2022-02-01 MED ORDER — FENTANYL CITRATE (PF) 100 MCG/2ML IJ SOLN: 25.0000 ug | INTRAMUSCULAR | Status: DC | PRN

## 2022-02-01 MED ORDER — BUPIVACAINE LIPOSOME 1.3 % IJ SUSP
INTRAMUSCULAR | Status: AC
  Filled 2022-02-01: qty 20

## 2022-02-01 MED ORDER — ONDANSETRON HCL 4 MG/2ML IJ SOLN: 4.0000 mg | INTRAMUSCULAR | Status: DC

## 2022-02-01 MED ORDER — VANCOMYCIN HCL 1000 MG IV SOLR
INTRAVENOUS | Status: DC | PRN
  Administered 2022-02-01: 1000 mg via INTRAVENOUS

## 2022-02-01 MED ORDER — ORAL CARE MOUTH RINSE: 15.0000 mL | Freq: Once | OROMUCOSAL | Status: AC

## 2022-02-01 MED ORDER — CHLORHEXIDINE GLUCONATE CLOTH 2 % EX PADS
6.0000 | MEDICATED_PAD | Freq: Once | CUTANEOUS | Status: AC
  Administered 2022-02-01: 6 via TOPICAL

## 2022-02-01 MED ORDER — ACETAMINOPHEN 500 MG PO TABS
ORAL_TABLET | ORAL | Status: AC
  Administered 2022-02-01: 1000 mg via ORAL
  Filled 2022-02-01: qty 2

## 2022-02-01 MED ORDER — OXYCODONE-ACETAMINOPHEN 5-325 MG PO TABS: 1.0000 | ORAL_TABLET | ORAL | 0 refills | Status: DC | PRN

## 2022-02-01 MED ORDER — ROCURONIUM BROMIDE 10 MG/ML (PF) SYRINGE
PREFILLED_SYRINGE | INTRAVENOUS | Status: AC
  Filled 2022-02-01: qty 10

## 2022-02-01 MED ORDER — VANCOMYCIN HCL IN DEXTROSE 1-5 GM/200ML-% IV SOLN
INTRAVENOUS | Status: AC
  Filled 2022-02-01: qty 200

## 2022-02-01 MED ORDER — SODIUM CHLORIDE 0.9 % IV SOLN: INTRAVENOUS | Status: DC

## 2022-02-01 MED ORDER — BUPIVACAINE-EPINEPHRINE (PF) 0.25% -1:200000 IJ SOLN
INTRAMUSCULAR | Status: AC
  Filled 2022-02-01: qty 30

## 2022-02-01 MED ORDER — ROCURONIUM BROMIDE 100 MG/10ML IV SOLN
INTRAVENOUS | Status: DC | PRN
  Administered 2022-02-01: 70 mg via INTRAVENOUS

## 2022-02-01 MED ORDER — BUPIVACAINE-EPINEPHRINE 0.25% -1:200000 IJ SOLN
INTRAMUSCULAR | Status: DC | PRN
  Administered 2022-02-01: 30 mL

## 2022-02-01 MED ORDER — SODIUM CHLORIDE 0.9 % IR SOLN
Status: DC | PRN
  Administered 2022-02-01: 1000 mL

## 2022-02-01 MED ORDER — GABAPENTIN 300 MG PO CAPS: 300.0000 mg | ORAL_CAPSULE | ORAL | Status: AC

## 2022-02-01 MED ORDER — CHLORHEXIDINE GLUCONATE 0.12 % MT SOLN: 15.0000 mL | Freq: Once | OROMUCOSAL | Status: AC

## 2022-02-01 MED ORDER — LIDOCAINE HCL (PF) 2 % IJ SOLN
INTRAMUSCULAR | Status: AC
  Filled 2022-02-01: qty 5

## 2022-02-01 MED ORDER — CHLORHEXIDINE GLUCONATE 0.12 % MT SOLN
OROMUCOSAL | Status: AC
  Administered 2022-02-01: 15 mL via OROMUCOSAL
  Filled 2022-02-01: qty 15

## 2022-02-01 MED ORDER — FENTANYL CITRATE (PF) 100 MCG/2ML IJ SOLN
INTRAMUSCULAR | Status: AC
  Filled 2022-02-01: qty 2

## 2022-02-01 MED ORDER — SUGAMMADEX SODIUM 500 MG/5ML IV SOLN
INTRAVENOUS | Status: AC
  Filled 2022-02-01: qty 5

## 2022-02-01 SURGICAL SUPPLY — 50 items
ADAPTER CATH DIALYSIS 4X8 IT L (MISCELLANEOUS) IMPLANT
ADAPTER TITANIUM MEDIONICS (MISCELLANEOUS) ×1 IMPLANT
ADH SKN CLS APL DERMABOND .7 (GAUZE/BANDAGES/DRESSINGS) ×1
ADPR DLYS CATH STRL LF DISP (MISCELLANEOUS) ×1
BIOPATCH WHT 1IN DISK W/4.0 H (GAUZE/BANDAGES/DRESSINGS) IMPLANT
BLADE CLIPPER SURG (BLADE) IMPLANT
CATH EXTENDED DIALYSIS (CATHETERS) ×1 IMPLANT
DERMABOND ADVANCED .7 DNX12 (GAUZE/BANDAGES/DRESSINGS) ×1 IMPLANT
ELECT CAUTERY BLADE TIP 2.5 (TIP) ×1
ELECT REM PT RETURN 9FT ADLT (ELECTROSURGICAL) ×1
ELECTRODE CAUTERY BLDE TIP 2.5 (TIP) ×1 IMPLANT
ELECTRODE REM PT RTRN 9FT ADLT (ELECTROSURGICAL) ×1 IMPLANT
GLOVE BIO SURGEON STRL SZ7 (GLOVE) ×1 IMPLANT
GOWN STRL REUS W/ TWL LRG LVL3 (GOWN DISPOSABLE) ×2 IMPLANT
GOWN STRL REUS W/TWL LRG LVL3 (GOWN DISPOSABLE) ×2
GRASPER SUT TROCAR 14GX15 (MISCELLANEOUS) ×1 IMPLANT
IV NS 1000ML (IV SOLUTION) ×1
IV NS 1000ML BAXH (IV SOLUTION) ×1 IMPLANT
KIT TURNOVER KIT A (KITS) ×1 IMPLANT
MANIFOLD NEPTUNE II (INSTRUMENTS) ×1 IMPLANT
MINICAP W/POVIDONE IODINE SOL (MISCELLANEOUS) ×1 IMPLANT
NDL INSUFFLATION 14GA 120MM (NEEDLE) ×1 IMPLANT
NDL SAFETY ECLIP 18X1.5 (MISCELLANEOUS) ×1 IMPLANT
NEEDLE HYPO 22GX1.5 SAFETY (NEEDLE) ×1 IMPLANT
NEEDLE INSUFFLATION 14GA 120MM (NEEDLE) ×1 IMPLANT
NS IRRIG 500ML POUR BTL (IV SOLUTION) ×1 IMPLANT
PACK LAP CHOLECYSTECTOMY (MISCELLANEOUS) ×1 IMPLANT
PENCIL SMOKE EVACUATOR (MISCELLANEOUS) ×1 IMPLANT
SET CYSTO W/LG BORE CLAMP LF (SET/KITS/TRAYS/PACK) ×1 IMPLANT
SET TRANSFER 6 W/TWIST CLAMP 5 (SET/KITS/TRAYS/PACK) ×1 IMPLANT
SET TUBE SMOKE EVAC HIGH FLOW (TUBING) ×1 IMPLANT
SLEEVE Z-THREAD 5X100MM (TROCAR) ×1 IMPLANT
SPONGE DRAIN TRACH 4X4 STRL 2S (GAUZE/BANDAGES/DRESSINGS) ×1 IMPLANT
SPONGE T-LAP 18X18 ~~LOC~~+RFID (SPONGE) ×1 IMPLANT
STYLET FALLER (MISCELLANEOUS) IMPLANT
SUT ETHILON 3-0 FS-10 30 BLK (SUTURE) ×1
SUT MNCRL 4-0 (SUTURE) ×1
SUT MNCRL 4-0 27XMFL (SUTURE) ×1
SUT VIC AB 3-0 SH 27 (SUTURE) ×1
SUT VIC AB 3-0 SH 27X BRD (SUTURE) ×1 IMPLANT
SUT VICRYL 0 AB UR-6 (SUTURE) ×2 IMPLANT
SUTURE EHLN 3-0 FS-10 30 BLK (SUTURE) ×1 IMPLANT
SUTURE MNCRL 4-0 27XMF (SUTURE) ×1 IMPLANT
SYR 20ML LL LF (SYRINGE) ×1 IMPLANT
SYR 3ML LL SCALE MARK (SYRINGE) ×1 IMPLANT
SYS KII FIOS ACCESS ABD 5X100 (TROCAR) ×1
SYSTEM KII FIOS ACES ABD 5X100 (TROCAR) ×1 IMPLANT
TRAP FLUID SMOKE EVACUATOR (MISCELLANEOUS) ×1 IMPLANT
TROCAR XCEL NON-BLD 5MMX100MML (ENDOMECHANICALS) ×1 IMPLANT
WATER STERILE IRR 500ML POUR (IV SOLUTION) ×1 IMPLANT

## 2022-02-01 NOTE — Anesthesia Preprocedure Evaluation (Addendum)
Anesthesia Evaluation  Patient identified by MRN, date of birth, ID band Patient awake    Reviewed: Allergy & Precautions, NPO status , Patient's Chart, lab work & pertinent test results  History of Anesthesia Complications Negative for: history of anesthetic complications  Airway Mallampati: IV  TM Distance: >3 FB Neck ROM: Full    Dental  (+) Dental Advisory Given, Missing, Poor Dentition, Chipped   Pulmonary asthma , sleep apnea , COPD,  COPD inhaler, Current Smoker and Patient abstained from smoking.,    Pulmonary exam normal breath sounds clear to auscultation       Cardiovascular Exercise Tolerance: Poor hypertension, Pt. on medications + CAD, + Past MI, + Cardiac Stents and +CHF  Normal cardiovascular exam Rhythm:Regular Rate:Normal  1. Left ventricular ejection fraction, by estimation, is 40 to 45%. The left ventricle has mildly decreased function. The left ventricle demonstrates global hypokinesis. There is mild left ventricular hypertrophy. Left ventricular diastolic parameters are consistent with Grade II diastolic dysfunction (pseudonormalization).  2. Right ventricular systolic function is normal. The right ventricular size is normal. There is moderately elevated pulmonary artery systolic  pressure.  3. Left atrial size was mild to moderately dilated.  4. Functional MR. The mitral valve is grossly normal. Mild to moderate mitral valve regurgitation.  5. The aortic valve is grossly normal. Aortic valve regurgitation is not visualized.  6. The inferior vena cava is dilated in size with <50% respiratory variability, suggesting right atrial pressure of 15 mmHg.   Comparison(s): No prior Echocardiogram.    Neuro/Psych  Headaches, PSYCHIATRIC DISORDERS Anxiety Depression Bipolar Disorder  Neuromuscular disease    GI/Hepatic GERD  Medicated and Controlled,(+) Hepatitis -  Endo/Other  diabetes, Well Controlled, Type 2,  Oral Hypoglycemic Agents  Renal/GU ESRF and DialysisRenal disease  negative genitourinary   Musculoskeletal  (+) Fibromyalgia -  Abdominal   Peds negative pediatric ROS (+)  Hematology  (+) Blood dyscrasia, anemia ,   Anesthesia Other Findings 16-Dec-2021 10:23:32 [place] [facility]-ER ROUTINE RECORD 14-Aug-1965 (55 yr) Female Caucasian PR interval 159 ms QRS duration 130 ms QT/QTcB 458/458 ms P-R-T axes 19 -43 -2 Sinus rhythm Nonspecific T wave abnormality inferior leads Confirmed by Godfrey Pick ([id number]) on 12/16/2021 12:29:03 PM  Reproductive/Obstetrics negative OB ROS                           Anesthesia Physical  Anesthesia Plan  ASA: 3  Anesthesia Plan: General ETT   Post-op Pain Management: Tylenol PO (pre-op)* and Gabapentin PO (pre-op)*   Induction: Intravenous  PONV Risk Score and Plan: 4 or greater and Ondansetron, Dexamethasone and Treatment may vary due to age or medical condition  Airway Management Planned: Oral ETT  Additional Equipment:   Intra-op Plan:   Post-operative Plan: Extubation in OR  Informed Consent: I have reviewed the patients History and Physical, chart, labs and discussed the procedure including the risks, benefits and alternatives for the proposed anesthesia with the patient or authorized representative who has indicated his/her understanding and acceptance.     Dental Advisory Given  Plan Discussed with: Anesthesiologist, CRNA and Surgeon  Anesthesia Plan Comments: (Patient consented for risks of anesthesia including but not limited to:  - adverse reactions to medications - damage to eyes, teeth, lips or other oral mucosa - nerve damage due to positioning  - sore throat or hoarseness - Damage to heart, brain, nerves, lungs, other parts of body or loss of life  Patient  voiced understanding.)       Anesthesia Quick Evaluation

## 2022-02-01 NOTE — Interval H&P Note (Signed)
History and Physical Interval Note:  02/01/2022 9:12 AM  Tracy Walsh  has presented today for surgery, with the diagnosis of ESRD.  The various methods of treatment have been discussed with the patient and family. After consideration of risks, benefits and other options for treatment, the patient has consented to  Procedure(s): [place]  (CAPD) CATHETER (N/A) as a surgical intervention.  The patient's history has been reviewed, patient examined, no change in status, stable for surgery.  I have reviewed the patient's chart and labs.  Questions were answered to the patient's satisfaction.     [place]

## 2022-02-01 NOTE — Discharge Instructions (Addendum)
Peritoneal Dialysis Catheter Placement, Care After The following information offers guidance on how to care for yourself after your procedure. Your health care provider may also give you more specific instructions. If you have problems or questions, contact your health care provider. What can I expect after the procedure? After the procedure, it is common to have some pain or discomfort in your abdomen and your incision area. You may need to wait 2 weeks after your procedure before you can start peritoneal dialysis treatment. If you need dialysis before that time, your health care provider may begin peritoneal dialysis treatment early or offer kidney dialysis treatments (hemodialysis) until you heal. Follow these instructions at home: Incision care  Follow instructions from your health care provider about how to take care of your incision or incisions. Make sure you: Wash your hands with soap and water for at least 20 seconds before and after you change your bandage (dressing). If soap and water are not available, use hand sanitizer. Change your dressing only as told by your health care provider. Your health care provider may tell you not to touch or change your dressing. Leave stitches (sutures), staples, skin glue, or adhesive strips in place. These skin closures may need to stay in place for 2 weeks or longer. If adhesive strip edges start to loosen and curl up, you may trim the loose edges. Do not remove adhesive strips completely unless your health care provider tells you to do that. Check your incision areas every day for signs of infection. If you were instructed not to touch or change your dressing, look at your dressing for signs of infection. Check for: Redness, swelling, or more pain. Fluid or blood. Warmth. Pus or a bad smell. Medicines Take over-the-counter and prescription medicines only as told by your health care provider. If you were prescribed an antibiotic medicine, use it as  told by your health care provider. Do not stop using the antibiotic even if you start to feel better. Ask your health care provider if the medicine prescribed to you requires you to avoid driving or using machinery. Driving Do not drive or ride in a car until your health care provider approves. Your seat belt could move the catheter out of position or cause irritation by rubbing on your incision. Activity  Rest and limit your activity. Do not lift anything that is heavier than 10 lb (4.5 kg), or the limit that you are told, until your health care provider says that it is safe. Return to your normal activities as told by your health care provider. Ask your health care provider what activities are safe for you. Managing constipation Your condition may cause constipation. To prevent or treat constipation, you may need to: Drink enough fluid to keep your urine pale yellow. Take over-the-counter or prescription medicines. Eat foods that are high in fiber, such as beans, whole grains, and fresh fruits and vegetables. Limit foods that are high in fat and processed sugars, such as fried or sweet foods. General instructions Do not use any products that contain nicotine or tobacco. These products include cigarettes, chewing tobacco, and vaping devices, such as e-cigarettes. If you need help quitting, ask your health care provider. Follow instructions from your health care provider about eating or drinking restrictions. Do not take baths, swim, or use a hot tub until your health care provider approves. Ask your health care provider if you may take showers. You may only be allowed to take sponge baths. Wear loose-fitting clothing that keeps   the catheter covered so that it cannot get caught on something. Keep your catheter clean and dry. Keep all follow-up visits. This is important. Contact a health care provider if: You have a fever or chills. You have warmth, redness, swelling, or more pain around an  incision. You have fluid or blood coming from an incision. You have pus or a bad smell coming from an incision. You cannot eat or drink without vomiting. Get help right away if: You have problems breathing. You are confused. You have trouble speaking. You have severe pain in your abdomen that does not get better with treatment. You have bright red blood in your stool (feces), or your stool is dark black and looks like tar. These symptoms may represent a serious problem that is an emergency. Do not wait to see if the symptoms will go away. Get medical help right away. Call your local emergency services (911 in the [place] Do not drive yourself to the hospital. Summary After the procedure, it is common to have some pain or discomfort in your abdomen, your incision area, or both. You may have to wait 2 weeks after your procedure before you can start peritoneal dialysis treatment. Check your incision area every day for signs of infection. Get medical help right away if you have severe pain in your abdomen that does not get better with treatment. This information is not intended to replace advice given to you by your health care provider. Make sure you discuss any questions you have with your health care provider. Document Revised: 11/18/2019 Document Reviewed: 11/18/2019 Elsevier Patient Education  2023 Elsevier Inc[employer]        AMBULATORY SURGERY  DISCHARGE INSTRUCTIONS   The drugs that you were given will stay in your system until tomorrow so for the next 24 hours you should not:  Drive an automobile Make any legal decisions Drink any alcoholic beverage   You may resume regular meals tomorrow.  Today it is better to start with liquids and gradually work up to solid foods.  You may eat anything you prefer, but it is better to start with liquids, then soup and crackers, and gradually work up to solid foods.   Please notify your doctor immediately if you have any unusual bleeding,  trouble breathing, redness and pain at the surgery site, drainage, fever, or pain not relieved by medication.     Your post-operative visit with Dr.                                       is: Date:                        Time:    Please call to schedule your post-operative visit.  Additional Instructions:  

## 2022-02-01 NOTE — Transfer of Care (Signed)
Immediate Anesthesia Transfer of Care Note  Patient: Tracy Walsh  Procedure(s) Performed: LAPAROSCOPIC INSERTION CONTINUOUS AMBULATORY PERITONEAL DIALYSIS  (CAPD) CATHETER  Patient Location: PACU  Anesthesia Type:General  Level of Consciousness: awake, alert  and oriented  Airway & Oxygen Therapy: Patient Spontanous Breathing and Patient connected to face mask oxygen  Post-op Assessment: Report given to RN and Post -op Vital signs reviewed and stable  Post vital signs: stable  Last Vitals:  Vitals Value Taken Time  BP 175/73 02/01/22 1046  Temp    Pulse 73 02/01/22 1051  Resp 23 02/01/22 1051  SpO2 100 % 02/01/22 1051  Vitals shown include unvalidated device data.  Last Pain:  Vitals:   02/01/22 0742  TempSrc: Temporal  PainSc: 0-No pain      Patients Stated Pain Goal: 0 (40/10/27 2536)  Complications: No notable events documented.

## 2022-02-01 NOTE — Anesthesia Procedure Notes (Signed)
Procedure Name: Intubation Date/Time: 02/01/2022 9:33 AM  Performed by: Natasha Mead, CRNAPre-anesthesia Checklist: Patient identified, Emergency Drugs available, Suction available and Patient being monitored Patient Re-evaluated:Patient Re-evaluated prior to induction Oxygen Delivery Method: Circle system utilized Preoxygenation: Pre-oxygenation with 100% oxygen Induction Type: IV induction Ventilation: Mask ventilation without difficulty Laryngoscope Size: McGraph and 3 Grade View: Grade II Tube type: Oral Tube size: 7.0 mm Number of attempts: 1 Airway Equipment and Method: Stylet and Oral airway Placement Confirmation: ETT inserted through vocal cords under direct vision, positive ETCO2 and breath sounds checked- equal and bilateral Secured at: 20 cm Tube secured with: Tape Dental Injury: Teeth and Oropharynx as per pre-operative assessment

## 2022-02-01 NOTE — Anesthesia Postprocedure Evaluation (Signed)
Anesthesia Post Note  Patient: Cylah Fannin  Procedure(s) Performed: LAPAROSCOPIC INSERTION CONTINUOUS AMBULATORY PERITONEAL DIALYSIS  (CAPD) CATHETER  Patient location during evaluation: PACU Anesthesia Type: General Level of consciousness: combative Pain management: pain level controlled Vital Signs Assessment: post-procedure vital signs reviewed and stable Respiratory status: spontaneous breathing, nonlabored ventilation, respiratory function stable and patient connected to nasal cannula oxygen Cardiovascular status: blood pressure returned to baseline and stable Postop Assessment: no apparent nausea or vomiting Anesthetic complications: no   No notable events documented.   Last Vitals:  Vitals:   02/01/22 1130 02/01/22 1153  BP: (!) 158/70 (!) 157/79  Pulse: 68 65  Resp: 18 16  Temp:  (!) 36.1 C  SpO2: 95% 93%    Last Pain:  Vitals:   02/01/22 1153  TempSrc: Temporal  PainSc: 3                  Ilene Qua

## 2022-02-01 NOTE — Op Note (Signed)
Laparoscopic placement of peritoneal Dialysis catheter ( Total three cuffs) Laparoscopic Omentopexy   Pre-operative Diagnosis: ESRD   Post-operative Diagnosis: same     Surgeon: Caroleen Hamman, MD FACS   Anesthesia: Gen. with endotracheal tube      Findings: Catheter within pelvis Good return after infusing Peritoneal cavity   Estimated Blood Loss: 5cc              Complications: none     Procedure Details  The patient was seen again in the Holding Room. The benefits, complications, treatment options, and expected outcomes were discussed with the patient. The risks of bleeding, infection, recurrence of symptoms, failure to resolve symptoms, catheter malfunction bowel injury, any of which could require further surgery were reviewed with the patient. The likelihood of improving the patient's symptoms with return to their baseline status is good.  The patient and/or family concurred with the proposed plan, giving informed consent.  The patient was taken to Operating Room, identified and the procedure verified. A Time Out was held and the above information confirmed.   Prior to the induction of general anesthesia, antibiotic prophylaxis was administered. VTE prophylaxis was in place. General endotracheal anesthesia was then administered and tolerated well. After the induction, the abdomen was prepped with Chloraprep and draped in the sterile fashion. The patient was positioned in the supine position.   Periumbilical incision created to the left of the midline.   The anterior rectus fascia identified and incised, rectus muscle identified and retracted laterally, using a port We were able to tunnel into the retro rectus space for about 6 cms. Peritoneum was pierced off the midline. Pneumoperitoneum was obtained w/o hemodynamic changes. We placed two additional laparoscopic ports under direct visualization.   We were able to thread the catheter via the laparoscopic port within the retrorectus  space in the standard fashion.  Under direct visualization we made sure that the coil portion of the catheter layed within the pelvis without any kinks.  I was able to also perform a counterincision in the left subcostal area and Lovena Le an additional extension of the catheter . The  Extension had 2 cuffs and I was able to connect it to parse together with the titanium connector in the standard fashion.  There was no evidence of any kinks.  The exit site was to the left upper quadrant. We instilled heparinized saline a liter into the pelvis and we had very good return.Attention was then turned to the omentum and using a PMI device we were able to perform an omentopexy and tacked the omentum to the abdominal wall using 2 interrupted 2-0 Vicryl sutures in the standard fashion. All skin incisions  were infiltrated with a liposomal Marcaine. 4-0 subcuticular Monocryl was used to close the skin. Dermabond was  applied. Sterile dressing applied to the catheter. The patient was then extubated and brought to the recovery room in stable condition. Sponge, lap, and needle counts were correct at closure and at the conclusion of the case.               Caroleen Hamman, MD, FACS

## 2022-02-02 ENCOUNTER — Encounter: Payer: Self-pay | Admitting: Surgery

## 2022-02-05 DIAGNOSIS — E1122 Type 2 diabetes mellitus with diabetic chronic kidney disease: Secondary | ICD-10-CM | POA: Diagnosis not present

## 2022-02-05 DIAGNOSIS — Z992 Dependence on renal dialysis: Secondary | ICD-10-CM | POA: Diagnosis not present

## 2022-02-05 DIAGNOSIS — N186 End stage renal disease: Secondary | ICD-10-CM | POA: Diagnosis not present

## 2022-02-05 DIAGNOSIS — E1165 Type 2 diabetes mellitus with hyperglycemia: Secondary | ICD-10-CM | POA: Diagnosis not present

## 2022-02-07 ENCOUNTER — Telehealth: Payer: Self-pay | Admitting: Surgery

## 2022-02-07 NOTE — Telephone Encounter (Signed)
Patient calls, she had PD cath placement done on 02/01/22 with Dr. Dahlia Byes. She is having pain and swelling, very uncomfortable.  She is out of her pain meds and will also need a refill.  She has post op on 02/13/22 scheduled.  Please advise.

## 2022-02-07 NOTE — Telephone Encounter (Signed)
Spoke with patient -she spoke with Nephrologist he told her it was most likely due to the fluid collection- she also had dialysis today. She is requesting refill on Percocet 5 mg.she has taken Tylenol and it did not help with the pain.Denies fever. Denies redness. Nurse changed bandages today and all looks good. Post operative appointment is scheduled 02/13/22 with Dr.Pabon. I let her know I would route this message to Dr.Pabon .

## 2022-02-08 ENCOUNTER — Other Ambulatory Visit: Payer: Self-pay | Admitting: Internal Medicine

## 2022-02-08 DIAGNOSIS — G4700 Insomnia, unspecified: Secondary | ICD-10-CM

## 2022-02-08 NOTE — Telephone Encounter (Signed)
I called pt twice today and there was now answer, I left her a voicemail  I typically do not do refill narcotics over the phone because of the potential for addiction with prolonged use. We will try again

## 2022-02-10 ENCOUNTER — Other Ambulatory Visit: Payer: Self-pay

## 2022-02-10 ENCOUNTER — Emergency Department (HOSPITAL_COMMUNITY)
Admission: EM | Admit: 2022-02-10 | Discharge: 2022-02-10 | Discharge status: Against Medical Advice | Payer: 59 | Attending: Emergency Medicine | Admitting: Emergency Medicine

## 2022-02-10 ENCOUNTER — Encounter (HOSPITAL_COMMUNITY): Payer: Self-pay | Admitting: Emergency Medicine

## 2022-02-10 DIAGNOSIS — Z5321 Procedure and treatment not carried out due to patient leaving prior to being seen by health care provider: Secondary | ICD-10-CM | POA: Insufficient documentation

## 2022-02-10 DIAGNOSIS — R109 Unspecified abdominal pain: Secondary | ICD-10-CM | POA: Insufficient documentation

## 2022-02-10 DIAGNOSIS — G8918 Other acute postprocedural pain: Secondary | ICD-10-CM | POA: Diagnosis not present

## 2022-02-10 NOTE — ED Triage Notes (Signed)
Patient had abdominal surgery (peritoneal dialysis cath placed) x 1 week ago and has had pain at surgical site since, but pain worsened 2 days ago and moved to the side opposite surgical site.  Patient reports she no longer has any pain medication.  Patient reports baseline urine output and normal bowel movements.

## 2022-02-11 ENCOUNTER — Other Ambulatory Visit: Payer: Self-pay | Admitting: Surgery

## 2022-02-11 MED ORDER — OXYCODONE-ACETAMINOPHEN 5-325 MG PO TABS: 1.0000 | ORAL_TABLET | ORAL | 0 refills | Status: DC | PRN

## 2022-02-11 NOTE — Telephone Encounter (Signed)
Called pt given persistent pain. Not having any clinical alarming signs. I was very candid, I typically o not prescribe refill.s She will come Wednesday for face to face and I will do only one time refill of percocet. # 20

## 2022-02-13 ENCOUNTER — Ambulatory Visit (INDEPENDENT_AMBULATORY_CARE_PROVIDER_SITE_OTHER): Payer: 59 | Admitting: Surgery

## 2022-02-13 ENCOUNTER — Encounter: Payer: Self-pay | Admitting: Surgery

## 2022-02-13 ENCOUNTER — Ambulatory Visit: Admitting: Internal Medicine

## 2022-02-13 VITALS — BP 150/75 | HR 70 | Temp 98.5°F | Wt 202.8 lb

## 2022-02-13 DIAGNOSIS — N186 End stage renal disease: Secondary | ICD-10-CM

## 2022-02-13 DIAGNOSIS — Z992 Dependence on renal dialysis: Secondary | ICD-10-CM

## 2022-02-13 DIAGNOSIS — Z09 Encounter for follow-up examination after completed treatment for conditions other than malignant neoplasm: Secondary | ICD-10-CM

## 2022-02-13 NOTE — Patient Instructions (Signed)
   Follow-up with our office as needed.  Please call and ask to speak with a nurse if you develop questions or concerns.  

## 2022-02-14 NOTE — Progress Notes (Signed)
Tracy Walsh is 10 days out from peritoneal dialysis catheter.  She is doing very well she is already started the flushes.  She did have some pain and I did do 1 refill.  I was very clear with her that this was the last refill from our practice.  She is tolerating diet.  She is getting hemodialysis  PE NAD Abd: soft, appropriate incisional tenderness ( minimal) no peritonitis , no infection, catheter in place \    A/ P doing well w/o complications F/U prn

## 2022-02-15 ENCOUNTER — Ambulatory Visit: Admitting: Internal Medicine

## 2022-02-17 ENCOUNTER — Other Ambulatory Visit (HOSPITAL_COMMUNITY): Payer: Self-pay | Admitting: Psychiatry

## 2022-02-17 DIAGNOSIS — F411 Generalized anxiety disorder: Secondary | ICD-10-CM

## 2022-02-17 DIAGNOSIS — F331 Major depressive disorder, recurrent, moderate: Secondary | ICD-10-CM

## 2022-02-19 DIAGNOSIS — E119 Type 2 diabetes mellitus without complications: Secondary | ICD-10-CM | POA: Diagnosis not present

## 2022-02-19 DIAGNOSIS — E1122 Type 2 diabetes mellitus with diabetic chronic kidney disease: Secondary | ICD-10-CM | POA: Diagnosis not present

## 2022-02-19 DIAGNOSIS — Z992 Dependence on renal dialysis: Secondary | ICD-10-CM | POA: Diagnosis not present

## 2022-02-19 DIAGNOSIS — Z794 Long term (current) use of insulin: Secondary | ICD-10-CM | POA: Diagnosis not present

## 2022-02-19 DIAGNOSIS — N186 End stage renal disease: Secondary | ICD-10-CM | POA: Diagnosis not present

## 2022-02-19 DIAGNOSIS — E1165 Type 2 diabetes mellitus with hyperglycemia: Secondary | ICD-10-CM | POA: Diagnosis not present

## 2022-02-19 DIAGNOSIS — Z13220 Encounter for screening for lipoid disorders: Secondary | ICD-10-CM | POA: Diagnosis not present

## 2022-02-20 ENCOUNTER — Encounter: Payer: Self-pay | Admitting: Vascular Surgery

## 2022-02-20 ENCOUNTER — Ambulatory Visit (INDEPENDENT_AMBULATORY_CARE_PROVIDER_SITE_OTHER): Admitting: Vascular Surgery

## 2022-02-20 ENCOUNTER — Ambulatory Visit (INDEPENDENT_AMBULATORY_CARE_PROVIDER_SITE_OTHER): Payer: 59

## 2022-02-20 VITALS — BP 177/78 | HR 71 | Temp 97.9°F | Ht 68.0 in | Wt 203.8 lb

## 2022-02-20 DIAGNOSIS — Z992 Dependence on renal dialysis: Secondary | ICD-10-CM

## 2022-02-20 DIAGNOSIS — N186 End stage renal disease: Secondary | ICD-10-CM

## 2022-02-20 NOTE — H&P (View-Only) (Signed)
[facility] [place]  Patient name: Tracy Walsh MRN: [id number] DOB: 02/06/1966 Sex: female  REASON FOR VISIT: Follow-up for stage right brachiobasilic fistula creation on 01/15/2022  HPI: Tracy Walsh is a 56 y.o. female here today for follow-up.  She has had a PD placement since my last visit with her.  She is having in center training with PD catheter and this is going well.  She plans to transition to home PD next week.  She has had no difficulty with her right arm fistula.  She has no steal symptoms.  Current Outpatient Medications  Medication Sig Dispense Refill   albuterol (PROVENTIL) (2.5 MG/3ML) 0.083% nebulizer solution Take 3 mLs (2.5 mg total) by nebulization every 6 (six) hours as needed for wheezing or shortness of breath. 75 mL 0   Ascorbic Acid (VITAMIN C) 1000 MG tablet Take 1,000 mg by mouth daily.     blood glucose meter kit and supplies Dispense based on patient and insurance preference. Use up to four times daily as directed. (FOR ICD-10 E10.9, E11.9). 1 each 0   Cholecalciferol 25 MCG (1000 UT) tablet Take 1,000 Units by mouth daily.     cyanocobalamin (,VITAMIN B-12,) 1000 MCG/ML injection Inject 1,000 mcg into the muscle every 30 (thirty) days. Started 1st of the month     dicyclomine (BENTYL) 10 MG capsule TAKE 1 CAPSULE (10 MG TOTAL) BY MOUTH 2 (TWO) TIMES DAILY AS NEEDED FOR SPASMS. 180 capsule 1   ferrous sulfate 325 (65 FE) MG EC tablet Take 325 mg by mouth daily with breakfast.     furosemide (LASIX) 80 MG tablet Please take 80 mg of Lasix on none HD days on Sunday, Monday, Wednesday and Friday 30 tablet 12   gabapentin (NEURONTIN) 100 MG capsule Take 2 capsules (200 mg total) by mouth at bedtime. (Patient taking differently: Take 200 mg by mouth 2 (two) times daily.) 60 capsule 2   GVOKE HYPOPEN 1-PACK 1 MG/0.2ML SOAJ INJECT 1 MG INTO THE SKIN AS NEEDED (SEVERE HYPOGLYCEMIA). 0.2 mL 0   Insulin Glargine  (BASAGLAR KWIKPEN) 100 UNIT/ML Inject 10 Units into the skin daily. 15 mL 0   Insulin Pen Needle 31G X 4 MM MISC 1 pen  by Does not apply route as needed. 100 each 1   isosorbide mononitrate (IMDUR) 30 MG 24 hr tablet Take 1 tablet (30 mg total) by mouth daily. 30 tablet 0   metoprolol succinate (TOPROL-XL) 50 MG 24 hr tablet Take 1 tablet (50 mg total) by mouth daily. 30 tablet 2   mupirocin ointment (BACTROBAN) 2 % Apply 1 Application topically daily. 22 g 0   omeprazole (PRILOSEC) 20 MG capsule Take 20 mg by mouth daily.     ondansetron (ZOFRAN) 4 MG tablet Take 1 tablet (4 mg total) by mouth daily as needed for nausea or vomiting. 20 tablet 1   prochlorperazine (COMPAZINE) 5 MG tablet Take 1 tablet (5 mg total) by mouth every 6 (six) hours as needed for nausea or vomiting. 30 tablet 0   sevelamer carbonate (RENVELA) 800 MG tablet Take 1 tablet (800 mg total) by mouth 3 (three) times daily with meals. 90 tablet 0   oxyCODONE-acetaminophen (PERCOCET) 5-325 MG tablet Take 1 tablet by mouth every 4 (four) hours as needed for severe pain. (Patient not taking: Reported on 02/20/2022) 20 tablet 0   sertraline (ZOLOFT) 50 MG tablet TAKE HALF A TABLET (25MG) ONCE DAILY. 45 tablet 1   No current facility-administered medications  for this visit.     PHYSICAL EXAM: Vitals:   02/20/22 1331  BP: (!) 177/78  Pulse: 71  Temp: 97.9 F (36.6 C)  SpO2: 97%  Weight: 203 lb 12.8 oz (92.4 kg)  Height: _0  (1.727 m)    GENERAL: The patient is a well-nourished female, in no acute distress. The vital signs are documented above. 2+ radial pulse.  Excellent thrill in her upper arm fistula  Duplex today shows very good size maturation of her basilic vein throughout its course  MEDICAL ISSUES: Good result from for stage brachiobasilic fistula creation.  She understands that the neck step would be second stage transposition.  She is having successful peritoneal dialysis.  We will confirm with Dr. Theador Hawthorne  the desire to proceed with transposition to have her fistula for a "backup for hemodialysis should she have peritoneal dialysis issues.  Assuming all in agreement to proceed, we would proceed with outpatient second stage transposition at [facility] on 03/05/2022   Rosetta Posner, MD FACS [facility] [phone]  Note: Portions of this report may have been transcribed using voice recognition software.  Every effort has been made to ensure accuracy; however, inadvertent computerized transcription errors may still be present.

## 2022-02-20 NOTE — Progress Notes (Signed)
[facility] [place]  Patient name: Tracy Walsh MRN: [id number] DOB: 04/26/1965 Sex: female  REASON FOR VISIT: Follow-up for stage right brachiobasilic fistula creation on 01/15/2022  HPI: Tracy Walsh is a 56 y.o. female here today for follow-up.  She has had a PD placement since my last visit with her.  She is having in center training with PD catheter and this is going well.  She plans to transition to home PD next week.  She has had no difficulty with her right arm fistula.  She has no steal symptoms.  Current Outpatient Medications  Medication Sig Dispense Refill   albuterol (PROVENTIL) (2.5 MG/3ML) 0.083% nebulizer solution Take 3 mLs (2.5 mg total) by nebulization every 6 (six) hours as needed for wheezing or shortness of breath. 75 mL 0   Ascorbic Acid (VITAMIN C) 1000 MG tablet Take 1,000 mg by mouth daily.     blood glucose meter kit and supplies Dispense based on patient and insurance preference. Use up to four times daily as directed. (FOR ICD-10 E10.9, E11.9). 1 each 0   Cholecalciferol 25 MCG (1000 UT) tablet Take 1,000 Units by mouth daily.     cyanocobalamin (,VITAMIN B-12,) 1000 MCG/ML injection Inject 1,000 mcg into the muscle every 30 (thirty) days. Started 1st of the month     dicyclomine (BENTYL) 10 MG capsule TAKE 1 CAPSULE (10 MG TOTAL) BY MOUTH 2 (TWO) TIMES DAILY AS NEEDED FOR SPASMS. 180 capsule 1   ferrous sulfate 325 (65 FE) MG EC tablet Take 325 mg by mouth daily with breakfast.     furosemide (LASIX) 80 MG tablet Please take 80 mg of Lasix on none HD days on Sunday, Monday, Wednesday and Friday 30 tablet 12   gabapentin (NEURONTIN) 100 MG capsule Take 2 capsules (200 mg total) by mouth at bedtime. (Patient taking differently: Take 200 mg by mouth 2 (two) times daily.) 60 capsule 2   GVOKE HYPOPEN 1-PACK 1 MG/0.2ML SOAJ INJECT 1 MG INTO THE SKIN AS NEEDED (SEVERE HYPOGLYCEMIA). 0.2 mL 0   Insulin Glargine  (BASAGLAR KWIKPEN) 100 UNIT/ML Inject 10 Units into the skin daily. 15 mL 0   Insulin Pen Needle 31G X 4 MM MISC 1 pen  by Does not apply route as needed. 100 each 1   isosorbide mononitrate (IMDUR) 30 MG 24 hr tablet Take 1 tablet (30 mg total) by mouth daily. 30 tablet 0   metoprolol succinate (TOPROL-XL) 50 MG 24 hr tablet Take 1 tablet (50 mg total) by mouth daily. 30 tablet 2   mupirocin ointment (BACTROBAN) 2 % Apply 1 Application topically daily. 22 g 0   omeprazole (PRILOSEC) 20 MG capsule Take 20 mg by mouth daily.     ondansetron (ZOFRAN) 4 MG tablet Take 1 tablet (4 mg total) by mouth daily as needed for nausea or vomiting. 20 tablet 1   prochlorperazine (COMPAZINE) 5 MG tablet Take 1 tablet (5 mg total) by mouth every 6 (six) hours as needed for nausea or vomiting. 30 tablet 0   sevelamer carbonate (RENVELA) 800 MG tablet Take 1 tablet (800 mg total) by mouth 3 (three) times daily with meals. 90 tablet 0   oxyCODONE-acetaminophen (PERCOCET) 5-325 MG tablet Take 1 tablet by mouth every 4 (four) hours as needed for severe pain. (Patient not taking: Reported on 02/20/2022) 20 tablet 0   sertraline (ZOLOFT) 50 MG tablet TAKE HALF A TABLET (25MG) ONCE DAILY. 45 tablet 1   No current facility-administered medications  for this visit.     PHYSICAL EXAM: Vitals:   02/20/22 1331  BP: (!) 177/78  Pulse: 71  Temp: 97.9 F (36.6 C)  SpO2: 97%  Weight: 203 lb 12.8 oz (92.4 kg)  Height: _0  (1.727 m)    GENERAL: The patient is a well-nourished female, in no acute distress. The vital signs are documented above. 2+ radial pulse.  Excellent thrill in her upper arm fistula  Duplex today shows very good size maturation of her basilic vein throughout its course  MEDICAL ISSUES: Good result from for stage brachiobasilic fistula creation.  She understands that the neck step would be second stage transposition.  She is having successful peritoneal dialysis.  We will confirm with Dr. Theador Hawthorne  the desire to proceed with transposition to have her fistula for a "backup for hemodialysis should she have peritoneal dialysis issues.  Assuming all in agreement to proceed, we would proceed with outpatient second stage transposition at [facility] on 03/05/2022   Rosetta Posner, MD FACS [facility] [phone]  Note: Portions of this report may have been transcribed using voice recognition software.  Every effort has been made to ensure accuracy; however, inadvertent computerized transcription errors may still be present.

## 2022-02-25 ENCOUNTER — Telehealth (INDEPENDENT_AMBULATORY_CARE_PROVIDER_SITE_OTHER): Payer: Self-pay | Admitting: *Deleted

## 2022-02-25 ENCOUNTER — Telehealth (INDEPENDENT_AMBULATORY_CARE_PROVIDER_SITE_OTHER): Payer: 59 | Admitting: Psychiatry

## 2022-02-25 ENCOUNTER — Encounter (HOSPITAL_COMMUNITY): Payer: Self-pay | Admitting: Psychiatry

## 2022-02-25 ENCOUNTER — Other Ambulatory Visit (INDEPENDENT_AMBULATORY_CARE_PROVIDER_SITE_OTHER): Payer: Self-pay | Admitting: *Deleted

## 2022-02-25 DIAGNOSIS — F410 Panic disorder [episodic paroxysmal anxiety] without agoraphobia: Secondary | ICD-10-CM | POA: Diagnosis not present

## 2022-02-25 DIAGNOSIS — K529 Noninfective gastroenteritis and colitis, unspecified: Secondary | ICD-10-CM

## 2022-02-25 DIAGNOSIS — G4709 Other insomnia: Secondary | ICD-10-CM | POA: Diagnosis not present

## 2022-02-25 DIAGNOSIS — N184 Chronic kidney disease, stage 4 (severe): Secondary | ICD-10-CM

## 2022-02-25 DIAGNOSIS — F17210 Nicotine dependence, cigarettes, uncomplicated: Secondary | ICD-10-CM | POA: Diagnosis not present

## 2022-02-25 DIAGNOSIS — G6181 Chronic inflammatory demyelinating polyneuritis: Secondary | ICD-10-CM | POA: Diagnosis not present

## 2022-02-25 DIAGNOSIS — R197 Diarrhea, unspecified: Secondary | ICD-10-CM

## 2022-02-25 DIAGNOSIS — F332 Major depressive disorder, recurrent severe without psychotic features: Secondary | ICD-10-CM

## 2022-02-25 DIAGNOSIS — F411 Generalized anxiety disorder: Secondary | ICD-10-CM

## 2022-02-25 DIAGNOSIS — R69 Illness, unspecified: Secondary | ICD-10-CM | POA: Diagnosis not present

## 2022-02-25 DIAGNOSIS — Z720 Tobacco use: Secondary | ICD-10-CM

## 2022-02-25 NOTE — Telephone Encounter (Signed)
Patient last seen 11/26/21. She called today having diarrhea all weekend and today. About 6 -7 times per day. She is on diaylsis today. Taking imodium 2 bid and bentyl one bid. No relief. No fever, no blood or mucus in stool  [phone].

## 2022-02-25 NOTE — Telephone Encounter (Signed)
Given her history, needs to have GI pathogen panel and C. Diff testing checked again, please send an order for this. Thanks
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
## 2022-02-25 NOTE — Telephone Encounter (Signed)
Discussed with patient and orders put in and placed at front for pick up.

## 2022-02-25 NOTE — Patient Instructions (Signed)
We increased your zoloft to 50mg  once daily; please continue to be on the lookout for tremors, sweating, flushing, or diarrhea that is outside of your normal pattern. Let your neurologist know about your jaw movements when you see them next; they may also be willing to increase your gabapentin though with it being renally cleared cannot guarantee this.

## 2022-02-26 ENCOUNTER — Other Ambulatory Visit: Payer: Self-pay

## 2022-02-26 DIAGNOSIS — N186 End stage renal disease: Secondary | ICD-10-CM

## 2022-02-27 ENCOUNTER — Ambulatory Visit (HOSPITAL_COMMUNITY): Payer: 59 | Admitting: Psychiatry

## 2022-02-27 ENCOUNTER — Ambulatory Visit (INDEPENDENT_AMBULATORY_CARE_PROVIDER_SITE_OTHER): Payer: 59 | Admitting: Internal Medicine

## 2022-02-27 ENCOUNTER — Encounter: Payer: Self-pay | Admitting: Internal Medicine

## 2022-02-27 ENCOUNTER — Telehealth (HOSPITAL_COMMUNITY): Payer: Self-pay | Admitting: *Deleted

## 2022-02-27 VITALS — BP 144/78 | HR 60 | Ht 68.0 in | Wt 203.2 lb

## 2022-02-27 DIAGNOSIS — F411 Generalized anxiety disorder: Secondary | ICD-10-CM

## 2022-02-27 DIAGNOSIS — E1165 Type 2 diabetes mellitus with hyperglycemia: Secondary | ICD-10-CM

## 2022-02-27 DIAGNOSIS — K529 Noninfective gastroenteritis and colitis, unspecified: Secondary | ICD-10-CM | POA: Diagnosis not present

## 2022-02-27 DIAGNOSIS — E876 Hypokalemia: Secondary | ICD-10-CM

## 2022-02-27 DIAGNOSIS — F410 Panic disorder [episodic paroxysmal anxiety] without agoraphobia: Secondary | ICD-10-CM

## 2022-02-27 DIAGNOSIS — F332 Major depressive disorder, recurrent severe without psychotic features: Secondary | ICD-10-CM | POA: Diagnosis not present

## 2022-02-27 DIAGNOSIS — I10 Essential (primary) hypertension: Secondary | ICD-10-CM

## 2022-02-27 DIAGNOSIS — G4709 Other insomnia: Secondary | ICD-10-CM

## 2022-02-27 DIAGNOSIS — R69 Illness, unspecified: Secondary | ICD-10-CM | POA: Diagnosis not present

## 2022-02-27 MED ORDER — ISOSORBIDE MONONITRATE ER 60 MG PO TB24: 60.0000 mg | ORAL_TABLET | Freq: Every day | ORAL | 2 refills | Status: DC

## 2022-02-27 NOTE — Assessment & Plan Note (Signed)
Chronic issue.  She is established with GI.  They are checking her for C. difficile again

## 2022-02-27 NOTE — Assessment & Plan Note (Signed)
She reports that her most recent A1c at [facility] was 6.0.  She is checking her blood sugar each morning and reports fasting readings 130-140s.  She has not started taking Basaglar 10 units due to concern for hypoglycemia in the setting of persistent diarrhea. -Through shared decision making, we will discontinue Basaglar.  I do not see an indication to start treatment if her fasting readings are 130-140s off of medication.  We can consider starting GLP-1 therapy pending resolution of diarrhea.

## 2022-02-27 NOTE — Patient Instructions (Signed)
It was a pleasure to see you today.  Thank you for giving [place] the opportunity to be involved in your care.  Below is a brief recap of your visit and next steps.  We will plan to see you again in 4 weeks.  Summary Increase Imdur to 60 mg today Discontinue basaglar I have placed a new psychiatry referral today

## 2022-02-27 NOTE — Assessment & Plan Note (Signed)
She has previously been referred to psychiatry to establish care.  She was last seen earlier this month.  Today she requests a new psychiatry referral as she does not feel listened to by her current provider.  -At her request, I have placed in a referral to psychiatry today

## 2022-02-27 NOTE — Assessment & Plan Note (Signed)
Likely from GI losses.  She reports that her K+ was 2.9 recently.  She is currently on potassium supplementation.  [facility] will repeat labs tomorrow.

## 2022-02-27 NOTE — Assessment & Plan Note (Signed)
BP 144/78 today.  She is currently prescribed metoprolol succinate 50 mg daily and Imdur 30 mg daily.  She has been checking her blood pressure at home and her recent readings show significant elevation with SBP's 180s. -Increase Imdur to 60 mg daily today -We will follow-up in 4 weeks for BP check.  I have asked her to bring her home BP cuff to that appointment so that we can assess for discrepancy between in office readings and home readings.

## 2022-02-27 NOTE — Progress Notes (Signed)
Established Patient Office Visit  Subjective   Patient ID: Tracy Walsh, female    DOB: 08-17-1965  Age: 56 y.o. MRN: [id number]  Chief Complaint  Patient presents with   Follow-up    BP   Tracy Walsh returns to care today. She is a 56 year old woman with a past medical history significant for ESRD on HD, CAD, systolic HF, HTN, IDA, N6EX, CIDP, depression, diabetic foot ulcers, and current tobacco use.  She was last seen by me on 10/18 for HTN follow-up.  She reported not taking Imdur due to not realizing that Imdur and isosorbide mononitrate were the same medication.  She also reported that she has not been using Basaglar because she needed pen needles.  2-week follow-up was arranged.  In the interim, Tracy Walsh has had a peritoneal dialysis catheter placed (10/20) and has been seen by vascular surgery in follow-up.  She has also been seen by psychiatry for follow-up.  Today Tracy Walsh states that things are going fairly well.  She is currently being trained on peritoneal dialysis and will begin home PD on Friday (11/17).  She currently endorses 55 fatigue and reports persistent diarrhea.  She states that Dr. Jenetta Downer is checking her for C. difficile.  She has been checking her blood pressure at home but reports that she has not started taking Basaglar yet due to concern for developing hypoglycemia.  She has continued to check her blood sugar each morning and reports readings 130-140s.  Her A1c was recently checked at [facility] and was 6.0.  She has also been started on potassium supplementation by [facility] due to severe hypokalemia.  Past Medical History:  Diagnosis Date   Anemia    Anxiety    Asthma    Bipolar disorder ([place]) 2018   type 2   CHF (congestive heart failure) (HCC)    CIDP (chronic inflammatory demyelinating polyneuropathy) (HCC)    CKD (chronic kidney disease)    COPD (chronic obstructive pulmonary disease) ([place])    Depression    Depression with anxiety 12/16/2021   Depression,  recurrent ([place]) 12/07/2021   Diabetes mellitus without complication (HCC)    type 2   Diabetic foot ulcers (HCC)    Fibromyalgia    GERD (gastroesophageal reflux disease)    Hepatitis    Hep as a child - patient can give blood   History of blood transfusion 08/2021   1 unit per patient   History of esophagogastroduodenoscopy (EGD) 2018   History of kidney stones    passed stones   HLD (hyperlipidemia)    Hypertension    Migraines    Myocardial infarction (HCC)    x 2   Neuropathy    legs and feet - bilateral   Sleep apnea 01/25/2022   Past Surgical History:  Procedure Laterality Date   APPENDECTOMY     AV FISTULA PLACEMENT Right 01/15/2022   Procedure: RIGHT ARM ARTERIOVENOUS (AV) FISTULA CREATION;  Surgeon: Rosetta Posner, MD;  [facility];  Service: Vascular;  Laterality: Right;   BILATERAL NASAL FRACTURE CLOSED REDUCTION  10/2020   CAPD INSERTION N/A 02/01/2022   Procedure: LAPAROSCOPIC INSERTION CONTINUOUS AMBULATORY PERITONEAL DIALYSIS  (CAPD) CATHETER;  Surgeon: Jules Husbands, MD;  Location: [facility];  Service: General;  Laterality: N/A;   CHOLECYSTECTOMY     COLONOSCOPY     CORONARY ANGIOPLASTY WITH STENT PLACEMENT  01/15/2021   FOOT SURGERY Right    I & D   I & D EXTREMITY Left 09/28/2021  Procedure: LEFT HEEL DEBRIDEMENT AND TISSUE GRAFT;  Surgeon: Newt Minion, MD;  Location: [facility];  Service: Orthopedics;  Laterality: Left;   IR FLUORO GUIDE CV LINE RIGHT  12/20/2021   IR US GUIDE VASC ACCESS RIGHT  12/20/2021   SHOULDER SURGERY Bilateral    SPINAL FUSION     TOTAL ABDOMINAL HYSTERECTOMY  1995   UPPER GI ENDOSCOPY     Social History   Tobacco Use   Smoking status: Every Day    Packs/day: 0.50    Types: Cigarettes   Smokeless tobacco: Never   Tobacco comments:    Slightly less than 0.5ppd as of 02/25/22  Vaping Use   Vaping Use: Never used  Substance Use Topics   Alcohol use: Never   Drug use: Never   History reviewed. No pertinent family  history. Allergies  Allergen Reactions   Amoxicillin Anaphylaxis   Clarithromycin Anaphylaxis, Diarrhea, Itching, Nausea And Vomiting and Rash   Codeine Itching   Hydrocodone Itching   Moxifloxacin Anaphylaxis, Anxiety, Hives, Itching, Nausea And Vomiting, Palpitations and Shortness Of Breath   Penicillins Anaphylaxis   Sulfa Antibiotics Anaphylaxis   Nitrofurantoin Nausea And Vomiting   Crab (Diagnostic) Hives   Latex Hives, Itching, Rash and Swelling   Review of Systems  Constitutional:  Positive for malaise/fatigue.  Gastrointestinal:  Positive for diarrhea.  All other systems reviewed and are negative.    Objective:     BP (!) 144/78   Pulse 60   Ht 5\' 8"  (1.727 m)   Wt 203 lb 3.2 oz (92.2 kg)   SpO2 96%   BMI 30.90 kg/m  BP Readings from Last 3 Encounters:  02/27/22 (!) 144/78  02/20/22 (!) 177/78  02/13/22 (!) 150/75   Physical Exam Vitals reviewed.  HENT:     Head: Normocephalic and atraumatic.     Right Ear: External ear normal.     Left Ear: External ear normal.     Nose: Nose normal. No congestion or rhinorrhea.     Mouth/Throat:     Mouth: Mucous membranes are moist.     Pharynx: Oropharynx is clear. No oropharyngeal exudate or posterior oropharyngeal erythema.  Eyes:     General: No scleral icterus.    Extraocular Movements: Extraocular movements intact.     Conjunctiva/sclera: Conjunctivae normal.     Pupils: Pupils are equal, round, and reactive to light.  Cardiovascular:     Rate and Rhythm: Normal rate and regular rhythm.     Pulses: Normal pulses.     Heart sounds: Murmur heard.  Pulmonary:     Effort: Pulmonary effort is normal.     Breath sounds: Normal breath sounds. No wheezing, rhonchi or rales.  Abdominal:     General: Abdomen is flat. Bowel sounds are normal. There is no distension.     Palpations: Abdomen is soft.     Tenderness: There is no abdominal tenderness.  Musculoskeletal:        General: Swelling (2+ bilateral lower  extremity edema) present.     Cervical back: Normal range of motion.     Right lower leg: Edema present.     Left lower leg: Edema present.     Comments: Fistula in RUE with palpable pulse and audible bruit.  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  Skin:    General: Skin is warm and dry.     Capillary Refill: Capillary refill takes less than 2 seconds.  Coloration: Skin is not jaundiced.  Neurological:     General: No focal deficit present.     Mental Status: She is alert and oriented to person, place, and time.  Psychiatric:        Mood and Affect: Mood normal.        Behavior: Behavior normal.    Last CBC Lab Results  Component Value Date   WBC 9.0 12/28/2021   HGB 10.5 (L) 02/01/2022   HCT 31.0 (L) 02/01/2022   MCV 96 12/28/2021   MCH 30.4 12/28/2021   RDW 14.3 12/28/2021   PLT 172 70/26/3785   Last metabolic panel Lab Results  Component Value Date   GLUCOSE 171 (H) 02/01/2022   NA 137 02/01/2022   K 3.4 (L) 02/01/2022   CL 94 (L) 02/01/2022   CO2 28 01/15/2022   BUN 12 02/01/2022   CREATININE 3.00 (H) 02/01/2022   GFRNONAA 21 (L) 01/15/2022   CALCIUM 8.2 (L) 01/15/2022   PHOS 5.5 (H) 12/22/2021   PROT 6.5 12/28/2021   ALBUMIN 3.3 (L) 12/28/2021   LABGLOB 3.2 12/28/2021   AGRATIO 1.0 (L) 12/28/2021   BILITOT <0.2 12/28/2021   ALKPHOS 146 (H) 12/28/2021   AST 20 12/28/2021   ALT 21 12/28/2021   ANIONGAP 9 01/15/2022   Last hemoglobin A1c Lab Results  Component Value Date   HGBA1C 6.3 (H) 12/16/2021   Last vitamin D Lab Results  Component Value Date   VD25OH 28.2 (L) 12/28/2021   Last vitamin B12 and Folate Lab Results  Component Value Date   VITAMINB12 1,079 (H) 09/01/2021   FOLATE 20.5 09/01/2021     Assessment & Plan:   Problem List Items Addressed This Visit       Essential hypertension - Primary    BP 144/78 today.  She is currently prescribed metoprolol succinate 50 mg daily and Imdur 30 mg  daily.  She has been checking her blood pressure at home and her recent readings show significant elevation with SBP's 180s. -Increase Imdur to 60 mg daily today -We will follow-up in 4 weeks for BP check.  I have asked her to bring her home BP cuff to that appointment so that we can assess for discrepancy between in office readings and home readings.      Chronic diarrhea    Chronic issue.  She is established with GI.  They are checking her for C. difficile again      Uncontrolled type 2 diabetes mellitus with hyperglycemia, without long-term current use of insulin ([place])    She reports that her most recent A1c at [facility] was 6.0.  She is checking her blood sugar each morning and reports fasting readings 130-140s.  She has not started taking Basaglar 10 units due to concern for hypoglycemia in the setting of persistent diarrhea. -Through shared decision making, we will discontinue Basaglar.  I do not see an indication to start treatment if her fasting readings are 130-140s off of medication.  We can consider starting GLP-1 therapy pending resolution of diarrhea.      Hypokalemia    Likely from GI losses.  She reports that her K+ was 2.9 recently.  She is currently on potassium supplementation.  [facility] will repeat labs tomorrow.      Major depressive disorder, recurrent episode [facility])    She has previously been referred to psychiatry to establish care.  She was last seen earlier this month.  Today she requests a new psychiatry referral as she does not  feel listened to by her current provider.  -At her request, I have placed in a referral to psychiatry today      Return in about 4 weeks (around 03/27/2022).    [name], MD

## 2022-02-27 NOTE — Telephone Encounter (Signed)
Received fax from Dr Holley Raring for CVC removal by [facility]. Pt currently scheduled for 2nd stage 03/05/2022. Would like CVC removal as the same time. Pt saw [facility] in office 02/20/2022.  Will give to [facility] to schedule.

## 2022-02-28 ENCOUNTER — Other Ambulatory Visit: Payer: Self-pay | Admitting: Internal Medicine

## 2022-02-28 ENCOUNTER — Ambulatory Visit (INDEPENDENT_AMBULATORY_CARE_PROVIDER_SITE_OTHER): Payer: 59 | Admitting: Gastroenterology

## 2022-02-28 ENCOUNTER — Telehealth: Payer: Self-pay

## 2022-02-28 ENCOUNTER — Encounter (INDEPENDENT_AMBULATORY_CARE_PROVIDER_SITE_OTHER): Payer: Self-pay | Admitting: Gastroenterology

## 2022-02-28 VITALS — Ht 68.0 in | Wt 203.0 lb

## 2022-02-28 DIAGNOSIS — R197 Diarrhea, unspecified: Secondary | ICD-10-CM | POA: Diagnosis not present

## 2022-02-28 DIAGNOSIS — I25119 Atherosclerotic heart disease of native coronary artery with unspecified angina pectoris: Secondary | ICD-10-CM

## 2022-02-28 DIAGNOSIS — A0471 Enterocolitis due to Clostridium difficile, recurrent: Secondary | ICD-10-CM | POA: Diagnosis not present

## 2022-02-28 NOTE — Patient Instructions (Signed)
Follow up stool testing If recurrent C. Diff infection, will need to do vancomycin taper and refer to [facility] for fecal transplant evaluation Avoid antidiarrheals for now If worsening diarrhea, abdominal pain or unable to tolerate oral intake, will need to go to the ER for further evaluation

## 2022-02-28 NOTE — Telephone Encounter (Signed)
Request received from [facility] for CVC removal on patient's already scheduled day of surgery, 03/05/22 since she is now on peritoneal dialysis. Catheter removal added to surgery with Dr. Donnetta Hutching.

## 2022-02-28 NOTE — Progress Notes (Signed)
Tracy Walsh, M.D. Gastroenterology & Hepatology [facility] [address] [place], [place] [address] Primary Care Physician: Tracy Abraham, MD [place] [address]  This is a telephone virtual visit.  It required patient-provider interaction for the medical decision making as documented below. The patient has consented and agreed to proceed with a Telehealth encounter.  VIRTUAL VISIT NOTE Patient location: home Provider location: office  I will communicate my assessment and recommendations to the referring MD via EMR.   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 female with PMH history of systolic CHF last EG 40-34%, moderate pulmonary hypertension), Chronic inflammatory demyelinating polyneuropathy (CIDP), ESRD on HD, COPD, type 2 diabetes, CAD with LAD stent October 2022,  who presents for evaluation of recurrent diarrhea.  Patient was last seen on 11/26/2021.  She reported having improvement of her diarrhea with a course of Dificid and receiving VOWST, although her bowel movement did not completely normalize as she was having 3 bowel movements per day.  Patient reports having recurrent watery bowel movements up to 7-8 per day for the last 2-3 weeks. She was only having 3 Bms per day with soft consistency prior to this.  States that ever since she was diagnosed with C. difficile she has never had a regular bowel movement pattern. Patient called the office on 02/25/2022 reporting recurrent diarrhea, GI pathogen panel and C. difficile testing were reordered but these have not resulted yet. She reports having recurrent abdominal pain in her lower abdomen. Denies fever but reports having chills. She has also noticed worsening abdominal distention recently. The patient denies having any nausea, vomiting,  hematochezia,  melena, hematemesis, diarrhea, jaundice, pruritus or weight loss. Has taken some antidiarrheals (Imodium) without any relief. Has lost some weight due to poor appetite.  Most recent Hb was 8.9.  Last [id number] -no report available Last Colonoscopy: 2022, no report available but she reports that she had a couple of polyps removed  Past Medical History: Past Medical History:  Diagnosis Date   Anemia    Anxiety    Asthma    Bipolar disorder ([place]) 2018   type 2   CHF (congestive heart failure) (HCC)    CIDP (chronic inflammatory demyelinating polyneuropathy) (HCC)    CKD (chronic kidney disease)    COPD (chronic obstructive pulmonary disease) ([place])    Depression    Depression with anxiety 12/16/2021   Depression, recurrent ([place]) 12/07/2021   Diabetes mellitus without complication (HCC)    type 2   Diabetic foot ulcers (HCC)    Fibromyalgia    GERD (gastroesophageal reflux disease)    Hepatitis    Hep as a child - patient can give blood   History of blood transfusion 08/2021   1 unit per patient   History of esophagogastroduodenoscopy (EGD) 2018   History of kidney stones    passed stones   HLD (hyperlipidemia)    Hypertension    Migraines    Myocardial infarction (HCC)    x 2   Neuropathy    legs and feet - bilateral   Sleep apnea 01/25/2022    Past Surgical History: Past Surgical History:  Procedure Laterality Date   APPENDECTOMY     AV FISTULA PLACEMENT Right 01/15/2022   Procedure: RIGHT ARM ARTERIOVENOUS (AV) FISTULA CREATION;  Surgeon: Rosetta Posner, MD;  Location: [facility];  Service: Vascular;  Laterality: Right;   BILATERAL NASAL FRACTURE CLOSED REDUCTION  10/2020   CAPD INSERTION N/A 02/01/2022   Procedure: LAPAROSCOPIC INSERTION CONTINUOUS AMBULATORY PERITONEAL DIALYSIS  (CAPD) CATHETER;  Surgeon: Jules Husbands, MD;  Location: [facility];  Service: General;  Laterality: N/A;   CHOLECYSTECTOMY     COLONOSCOPY     CORONARY ANGIOPLASTY WITH STENT PLACEMENT   01/15/2021   FOOT SURGERY Right    I & D   I & D EXTREMITY Left 09/28/2021   Procedure: LEFT HEEL DEBRIDEMENT AND TISSUE GRAFT;  Surgeon: Newt Minion, MD;  Location: [facility];  Service: Orthopedics;  Laterality: Left;   IR FLUORO GUIDE CV LINE RIGHT  12/20/2021   IR US GUIDE VASC ACCESS RIGHT  12/20/2021   SHOULDER SURGERY Bilateral    SPINAL FUSION     TOTAL ABDOMINAL HYSTERECTOMY  1995   UPPER GI ENDOSCOPY      Family History:History reviewed. No pertinent family history.  Social History: Social History   Tobacco Use  Smoking Status Every Day   Packs/day: 0.50   Types: Cigarettes  Smokeless Tobacco Never  Tobacco Comments   Slightly less than 0.5ppd as of 02/25/22   Social History   Substance and Sexual Activity  Alcohol Use Never   Social History   Substance and Sexual Activity  Drug Use Never    Allergies: Allergies  Allergen Reactions   Amoxicillin Anaphylaxis   Clarithromycin Anaphylaxis, Diarrhea, Itching, Nausea And Vomiting and Rash   Codeine Itching   Hydrocodone Itching   Moxifloxacin Anaphylaxis, Anxiety, Hives, Itching, Nausea And Vomiting, Palpitations and Shortness Of Breath   Penicillins Anaphylaxis   Sulfa Antibiotics Anaphylaxis   Nitrofurantoin Nausea And Vomiting   Crab (Diagnostic) Hives   Latex Hives, Itching, Rash and Swelling    Medications: Current Outpatient Medications  Medication Sig Dispense Refill   albuterol (PROVENTIL) (2.5 MG/3ML) 0.083% nebulizer solution Take 3 mLs (2.5 mg total) by nebulization every 6 (six) hours as needed for wheezing or shortness of breath. 75 mL 0   blood glucose meter kit and supplies Dispense based on patient and insurance preference. Use up to four times daily as directed. (FOR ICD-10 E10.9, E11.9). 1 each 0   cyanocobalamin (,VITAMIN B-12,) 1000 MCG/ML injection Inject 1,000 mcg into the muscle every 30 (thirty) days.     dicyclomine (BENTYL) 10 MG capsule TAKE 1 CAPSULE (10 MG TOTAL) BY MOUTH 2  (TWO) TIMES DAILY AS NEEDED FOR SPASMS. 180 capsule 1   ferrous sulfate 325 (65 FE) MG EC tablet Take 325 mg by mouth daily with breakfast.     furosemide (LASIX) 80 MG tablet Please take 80 mg of Lasix on none HD days on Sunday, Monday, Wednesday and Friday (Patient taking differently: Take 80 mg by mouth See admin instructions. Please take 80 mg of Lasix on none HD days on Sunday, Monday, Wednesday and Friday) 30 tablet 12   gabapentin (NEURONTIN) 100 MG capsule Take 2 capsules (200 mg total) by mouth at bedtime. (Patient taking differently: Take 200 mg by mouth 2 (two) times daily.) 60 capsule 2   GVOKE HYPOPEN 1-PACK 1 MG/0.2ML SOAJ INJECT 1 MG INTO THE SKIN AS NEEDED (SEVERE HYPOGLYCEMIA). 0.2 mL 0   isosorbide mononitrate (IMDUR) 60 MG 24 hr tablet Take 1 tablet (60 mg total) by mouth daily. 30 tablet 2   metoprolol succinate (TOPROL-XL) 50 MG 24 hr tablet TAKE 1 TABLET BY MOUTH EVERY DAY 30 tablet 2   mupirocin ointment (BACTROBAN) 2 %  Apply 1 Application topically daily. 22 g 0   omeprazole (PRILOSEC) 20 MG capsule Take 20 mg by mouth daily.     ondansetron (ZOFRAN) 4 MG tablet Take 1 tablet (4 mg total) by mouth daily as needed for nausea or vomiting. 20 tablet 1   potassium chloride SA (KLOR-CON M) 20 MEQ tablet Take 20 mEq by mouth 2 (two) times daily.     sertraline (ZOLOFT) 50 MG tablet TAKE HALF A TABLET (25MG) ONCE DAILY. 45 tablet 1   sevelamer carbonate (RENVELA) 800 MG tablet Take 1 tablet (800 mg total) by mouth 3 (three) times daily with meals. 90 tablet 0   No current facility-administered medications for this visit.    Review of Systems: GENERAL: negative for malaise, night sweats HEENT: No changes in hearing or vision, no nose bleeds or other nasal problems. NECK: Negative for lumps, goiter, pain and significant neck swelling RESPIRATORY: Negative for cough, wheezing CARDIOVASCULAR: Negative for chest pain, leg swelling, palpitations, orthopnea GI: SEE  HPI MUSCULOSKELETAL: Negative for joint pain or swelling, back pain, and muscle pain. SKIN: Negative for lesions, rash PSYCH: Negative for sleep disturbance, mood disorder and recent psychosocial stressors. HEMATOLOGY Negative for prolonged bleeding, bruising easily, and swollen nodes. ENDOCRINE: Negative for cold or heat intolerance, polyuria, polydipsia and goiter. NEURO: negative for tremor, gait imbalance, syncope and seizures. The remainder of the review of systems is noncontributory.   Physical Exam: No exam was performed as this was a telephone encounter  Imaging/Labs: as above  I personally reviewed and interpreted the available labs, imaging and endoscopic files.  Impression and Plan: Tracy Walsh is a 56 y.o. female with PMH history of systolic CHF last EG 16-10%, moderate pulmonary hypertension), Chronic inflammatory demyelinating polyneuropathy (CIDP), ESRD on HD, COPD, type 2 diabetes, CAD with LAD stent October 2022,  who presents for evaluation of recurrent diarrhea.  The patient has presented recurrent diarrhea despite finishing the first-line regimen antibiotic for recurrent C. difficile and taking oral VOWST.  I explained to her that given her complex history of C. difficile, I am concerned she is presenting with recurrence of disease.  We will need to follow the results of the GI pathogen panel and C. difficile testing to determine if she requires any antibiotic treatment at the moment.  If so, we will opt for vancomycin taper and referral to [facility] for possible fecal transplant.  I explained her that she should avoid antidiarrheals for now until C. difficile infection is rule out as this can worsen her disease.  I also gave her recommendations regarding when to go to the ER, which she understood  - Follow up stool testing - If recurrent C. Diff infection, will need to do vancomycin taper and refer to [facility] for fecal transplant evaluation - Avoid antidiarrheals for now - If  worsening diarrhea, abdominal pain or unable to tolerate oral intake, will need to go to the ER for further evaluation  All questions were answered.      Total visit time: I spent a total of 20 minutes  Tracy Peppers, MD Gastroenterology and [facility]

## 2022-02-28 NOTE — Addendum Note (Signed)
Addended by: Nicholas Lose on: 02/28/2022 12:36 PM   Modules accepted: Orders

## 2022-03-01 ENCOUNTER — Other Ambulatory Visit (INDEPENDENT_AMBULATORY_CARE_PROVIDER_SITE_OTHER): Payer: Self-pay | Admitting: Gastroenterology

## 2022-03-01 ENCOUNTER — Encounter (HOSPITAL_COMMUNITY)
Admission: RE | Admit: 2022-03-01 | Discharge: 2022-03-01 | Disposition: A | Discharge status: Home / Self Care | Source: Ambulatory Visit | Attending: Vascular Surgery | Admitting: Vascular Surgery

## 2022-03-01 DIAGNOSIS — A0471 Enterocolitis due to Clostridium difficile, recurrent: Secondary | ICD-10-CM

## 2022-03-01 DIAGNOSIS — N184 Chronic kidney disease, stage 4 (severe): Secondary | ICD-10-CM

## 2022-03-01 MED ORDER — VANCOMYCIN HCL 125 MG PO CAPS: ORAL_CAPSULE | ORAL | 0 refills | Status: DC

## 2022-03-02 LAB — GASTROINTESTINAL PATHOGEN PNL
CampyloBacter Group: NOT DETECTED
Norovirus GI/GII: NOT DETECTED
Rotavirus A: NOT DETECTED
Salmonella species: NOT DETECTED
Shiga Toxin 1: NOT DETECTED
Shiga Toxin 2: NOT DETECTED
Shigella Species: NOT DETECTED
Vibrio Group: NOT DETECTED
Yersinia enterocolitica: NOT DETECTED

## 2022-03-02 LAB — C. DIFFICILE GDH AND TOXIN A/B
GDH ANTIGEN: DETECTED
MICRO NUMBER:: 14193332
SPECIMEN QUALITY:: ADEQUATE
TOXIN A AND B: NOT DETECTED

## 2022-03-02 LAB — TEST AUTHORIZATION: TEST CODE:: 38470

## 2022-03-02 LAB — CLOSTRIDIUM DIFFICILE TOXIN B, QUALITATIVE, REAL-TIME PCR: Toxigenic C. Difficile by PCR: DETECTED — AB

## 2022-03-04 ENCOUNTER — Encounter: Payer: Self-pay | Admitting: Hematology

## 2022-03-04 ENCOUNTER — Other Ambulatory Visit (INDEPENDENT_AMBULATORY_CARE_PROVIDER_SITE_OTHER): Payer: Self-pay | Admitting: Gastroenterology

## 2022-03-04 DIAGNOSIS — A0471 Enterocolitis due to Clostridium difficile, recurrent: Secondary | ICD-10-CM

## 2022-03-04 NOTE — Telephone Encounter (Signed)
Called and spoke with pharmacy  per Dr. Jenetta Downer -  No dosage adjustments are needed as this is an oral medication with very very very low absorption   Pharmacy verbalized understanding and asked me to resend the rx.

## 2022-03-04 NOTE — Telephone Encounter (Signed)
Copied Dr. Colman Cater directions for med and will call pharmacy when they open today to discuss.  We will prescribe vancomycin taper as follows: 125 mg orally 4 times daily for 14 days, then 125 mg orally 2 times daily for 7 days, then 125 mg orally once daily for 7 days, then 125 mg orally every other day for 4 weeks

## 2022-03-04 NOTE — Telephone Encounter (Signed)
[facility] pharmacy called to verify dosing. Pt is on dialysis and was not sure if this dose was adjusted for dialysis.   Pharmacy number [phone]. Pharmacist did not leave his name on voicemail.

## 2022-03-04 NOTE — Telephone Encounter (Signed)
No dosage adjustments are needed as this is an oral medication with very very very low absorption

## 2022-03-04 NOTE — Telephone Encounter (Signed)
Copied Dr. Colman Cater directions for med and will call pharmacy when they open today to discuss.

## 2022-03-05 ENCOUNTER — Ambulatory Visit (HOSPITAL_COMMUNITY)
Admission: RE | Admit: 2022-03-05 | Discharge: 2022-03-05 | Disposition: A | Discharge status: Home / Self Care | Payer: 59 | Attending: Vascular Surgery | Admitting: Vascular Surgery

## 2022-03-05 ENCOUNTER — Encounter (HOSPITAL_COMMUNITY)
Admission: RE | Disposition: A | Discharge status: Home / Self Care | Payer: Self-pay | Source: Home / Self Care | Attending: Vascular Surgery

## 2022-03-05 ENCOUNTER — Ambulatory Visit (HOSPITAL_COMMUNITY): Payer: 59 | Admitting: Certified Registered Nurse Anesthetist

## 2022-03-05 ENCOUNTER — Ambulatory Visit (HOSPITAL_BASED_OUTPATIENT_CLINIC_OR_DEPARTMENT_OTHER): Payer: 59 | Admitting: Certified Registered Nurse Anesthetist

## 2022-03-05 ENCOUNTER — Telehealth (INDEPENDENT_AMBULATORY_CARE_PROVIDER_SITE_OTHER): Payer: Self-pay | Admitting: *Deleted

## 2022-03-05 ENCOUNTER — Encounter (HOSPITAL_COMMUNITY): Payer: Self-pay | Admitting: Vascular Surgery

## 2022-03-05 ENCOUNTER — Other Ambulatory Visit: Payer: Self-pay

## 2022-03-05 DIAGNOSIS — G4730 Sleep apnea, unspecified: Secondary | ICD-10-CM | POA: Insufficient documentation

## 2022-03-05 DIAGNOSIS — R69 Illness, unspecified: Secondary | ICD-10-CM | POA: Diagnosis not present

## 2022-03-05 DIAGNOSIS — Z794 Long term (current) use of insulin: Secondary | ICD-10-CM | POA: Insufficient documentation

## 2022-03-05 DIAGNOSIS — I509 Heart failure, unspecified: Secondary | ICD-10-CM | POA: Insufficient documentation

## 2022-03-05 DIAGNOSIS — I252 Old myocardial infarction: Secondary | ICD-10-CM | POA: Insufficient documentation

## 2022-03-05 DIAGNOSIS — F319 Bipolar disorder, unspecified: Secondary | ICD-10-CM | POA: Diagnosis not present

## 2022-03-05 DIAGNOSIS — Z4901 Encounter for fitting and adjustment of extracorporeal dialysis catheter: Secondary | ICD-10-CM | POA: Insufficient documentation

## 2022-03-05 DIAGNOSIS — N186 End stage renal disease: Secondary | ICD-10-CM | POA: Insufficient documentation

## 2022-03-05 DIAGNOSIS — E1122 Type 2 diabetes mellitus with diabetic chronic kidney disease: Secondary | ICD-10-CM

## 2022-03-05 DIAGNOSIS — G709 Myoneural disorder, unspecified: Secondary | ICD-10-CM | POA: Diagnosis not present

## 2022-03-05 DIAGNOSIS — I132 Hypertensive heart and chronic kidney disease with heart failure and with stage 5 chronic kidney disease, or end stage renal disease: Secondary | ICD-10-CM

## 2022-03-05 DIAGNOSIS — F419 Anxiety disorder, unspecified: Secondary | ICD-10-CM | POA: Insufficient documentation

## 2022-03-05 DIAGNOSIS — D649 Anemia, unspecified: Secondary | ICD-10-CM | POA: Insufficient documentation

## 2022-03-05 DIAGNOSIS — K219 Gastro-esophageal reflux disease without esophagitis: Secondary | ICD-10-CM | POA: Insufficient documentation

## 2022-03-05 DIAGNOSIS — F17200 Nicotine dependence, unspecified, uncomplicated: Secondary | ICD-10-CM | POA: Insufficient documentation

## 2022-03-05 DIAGNOSIS — I2510 Atherosclerotic heart disease of native coronary artery without angina pectoris: Secondary | ICD-10-CM | POA: Diagnosis not present

## 2022-03-05 DIAGNOSIS — N184 Chronic kidney disease, stage 4 (severe): Secondary | ICD-10-CM

## 2022-03-05 DIAGNOSIS — J449 Chronic obstructive pulmonary disease, unspecified: Secondary | ICD-10-CM | POA: Insufficient documentation

## 2022-03-05 HISTORY — PX: REMOVAL OF A DIALYSIS CATHETER: SHX6053

## 2022-03-05 HISTORY — PX: BASCILIC VEIN TRANSPOSITION: SHX5742

## 2022-03-05 LAB — BASIC METABOLIC PANEL
Anion gap: 6 (ref 5–15)
BUN: 31 mg/dL — ABNORMAL HIGH (ref 6–20)
CO2: 22 mmol/L (ref 22–32)
Calcium: 7.8 mg/dL — ABNORMAL LOW (ref 8.9–10.3)
Chloride: 111 mmol/L (ref 98–111)
Creatinine, Ser: 4.74 mg/dL — ABNORMAL HIGH (ref 0.44–1.00)
GFR, Estimated: 10 mL/min — ABNORMAL LOW (ref >60)
Glucose, Bld: 108 mg/dL — ABNORMAL HIGH (ref 70–99)
Potassium: 3.5 mmol/L (ref 3.5–5.1)
Sodium: 139 mmol/L (ref 135–145)

## 2022-03-05 LAB — HEMOGLOBIN AND HEMATOCRIT, BLOOD
HCT: 32 % — ABNORMAL LOW (ref 36.0–46.0)
Hemoglobin: 10.1 g/dL — ABNORMAL LOW (ref 12.0–15.0)

## 2022-03-05 LAB — GLUCOSE, CAPILLARY
Glucose-Capillary: 89 mg/dL (ref 70–99)
Glucose-Capillary: 98 mg/dL (ref 70–99)

## 2022-03-05 SURGERY — TRANSPOSITION, VEIN, BASILIC
Anesthesia: General | Site: Neck | Laterality: Right

## 2022-03-05 MED ORDER — VANCOMYCIN HCL IN DEXTROSE 1-5 GM/200ML-% IV SOLN
1000.0000 mg | INTRAVENOUS | Status: AC
  Administered 2022-03-05: 1000 mg via INTRAVENOUS

## 2022-03-05 MED ORDER — SODIUM CHLORIDE 0.9 % IV SOLN: INTRAVENOUS | Status: DC

## 2022-03-05 MED ORDER — PHENYLEPHRINE 80 MCG/ML (10ML) SYRINGE FOR IV PUSH (FOR BLOOD PRESSURE SUPPORT)
PREFILLED_SYRINGE | INTRAVENOUS | Status: AC
  Filled 2022-03-05: qty 10

## 2022-03-05 MED ORDER — OXYCODONE HCL 5 MG/5ML PO SOLN: 5.0000 mg | Freq: Once | ORAL | Status: AC | PRN

## 2022-03-05 MED ORDER — MIDAZOLAM HCL 2 MG/2ML IJ SOLN
INTRAMUSCULAR | Status: AC
  Filled 2022-03-05: qty 2

## 2022-03-05 MED ORDER — LACTATED RINGERS IV SOLN: INTRAVENOUS | Status: DC

## 2022-03-05 MED ORDER — OXYCODONE HCL 5 MG PO TABS
5.0000 mg | ORAL_TABLET | Freq: Once | ORAL | Status: AC | PRN
  Administered 2022-03-05: 5 mg via ORAL
  Filled 2022-03-05: qty 1

## 2022-03-05 MED ORDER — FENTANYL CITRATE (PF) 100 MCG/2ML IJ SOLN
INTRAMUSCULAR | Status: DC | PRN
  Administered 2022-03-05 (×4): 25 ug via INTRAVENOUS

## 2022-03-05 MED ORDER — ONDANSETRON HCL 4 MG/2ML IJ SOLN
INTRAMUSCULAR | Status: DC | PRN
  Administered 2022-03-05: 4 mg via INTRAVENOUS

## 2022-03-05 MED ORDER — PROPOFOL 10 MG/ML IV BOLUS
INTRAVENOUS | Status: DC | PRN
  Administered 2022-03-05: 150 mg via INTRAVENOUS
  Administered 2022-03-05: 50 mg via INTRAVENOUS

## 2022-03-05 MED ORDER — PHENYLEPHRINE HCL (PRESSORS) 10 MG/ML IV SOLN
INTRAVENOUS | Status: DC | PRN
  Administered 2022-03-05 (×18): 80 ug via INTRAVENOUS

## 2022-03-05 MED ORDER — CHLORHEXIDINE GLUCONATE 4 % EX LIQD: 60.0000 mL | Freq: Once | CUTANEOUS | Status: DC

## 2022-03-05 MED ORDER — OXYCODONE-ACETAMINOPHEN 5-325 MG PO TABS: 1.0000 | ORAL_TABLET | Freq: Four times a day (QID) | ORAL | 0 refills | Status: DC | PRN

## 2022-03-05 MED ORDER — LIDOCAINE HCL (PF) 1 % IJ SOLN
INTRAMUSCULAR | Status: AC
  Filled 2022-03-05: qty 30

## 2022-03-05 MED ORDER — HEPARIN 6000 UNIT IRRIGATION SOLUTION
Status: DC | PRN
  Administered 2022-03-05: 1

## 2022-03-05 MED ORDER — PROPOFOL 10 MG/ML IV BOLUS
INTRAVENOUS | Status: AC
  Filled 2022-03-05: qty 20

## 2022-03-05 MED ORDER — MIDAZOLAM HCL 5 MG/5ML IJ SOLN
INTRAMUSCULAR | Status: DC | PRN
  Administered 2022-03-05: 1 mg via INTRAVENOUS

## 2022-03-05 MED ORDER — LIDOCAINE-EPINEPHRINE (PF) 1 %-1:200000 IJ SOLN
INTRAMUSCULAR | Status: AC
  Filled 2022-03-05: qty 30

## 2022-03-05 MED ORDER — ONDANSETRON HCL 4 MG/2ML IJ SOLN
INTRAMUSCULAR | Status: AC
  Filled 2022-03-05: qty 2

## 2022-03-05 MED ORDER — 0.9 % SODIUM CHLORIDE (POUR BTL) OPTIME
TOPICAL | Status: DC | PRN
  Administered 2022-03-05: 500 mL

## 2022-03-05 MED ORDER — FENTANYL CITRATE PF 50 MCG/ML IJ SOSY
25.0000 ug | PREFILLED_SYRINGE | INTRAMUSCULAR | Status: DC | PRN
  Administered 2022-03-05: 50 ug via INTRAVENOUS
  Filled 2022-03-05: qty 1

## 2022-03-05 MED ORDER — VANCOMYCIN HCL IN DEXTROSE 1-5 GM/200ML-% IV SOLN
INTRAVENOUS | Status: AC
  Filled 2022-03-05: qty 200

## 2022-03-05 MED ORDER — CHLORHEXIDINE GLUCONATE 0.12 % MT SOLN: 15.0000 mL | Freq: Once | OROMUCOSAL | Status: DC

## 2022-03-05 MED ORDER — FENTANYL CITRATE (PF) 100 MCG/2ML IJ SOLN
INTRAMUSCULAR | Status: AC
  Filled 2022-03-05: qty 2

## 2022-03-05 MED ORDER — ORAL CARE MOUTH RINSE: 15.0000 mL | Freq: Once | OROMUCOSAL | Status: DC

## 2022-03-05 MED ORDER — HEPARIN SODIUM (PORCINE) 1000 UNIT/ML IJ SOLN
INTRAMUSCULAR | Status: AC
  Filled 2022-03-05: qty 6

## 2022-03-05 MED ORDER — ONDANSETRON HCL 4 MG/2ML IJ SOLN: 4.0000 mg | Freq: Once | INTRAMUSCULAR | Status: DC | PRN

## 2022-03-05 SURGICAL SUPPLY — 61 items
ADH SKN CLS APL DERMABOND .7 (GAUZE/BANDAGES/DRESSINGS) ×2
APL PRP STRL LF ISPRP CHG 10.5 (MISCELLANEOUS) ×2
APPLICATOR CHLORAPREP 10.5 ORG (MISCELLANEOUS) ×2 IMPLANT
ARMBAND PINK RESTRICT EXTREMIT (MISCELLANEOUS) ×2 IMPLANT
BAG HAMPER (MISCELLANEOUS) ×2 IMPLANT
CANNULA VESSEL 3MM 2 BLNT TIP (CANNULA) ×2 IMPLANT
CLIP LIGATING EXTRA MED SLVR (CLIP) ×2 IMPLANT
CLIP LIGATING EXTRA SM BLUE (MISCELLANEOUS) ×2 IMPLANT
CLOTH BEACON ORANGE TIMEOUT ST (SAFETY) ×2 IMPLANT
COVER LIGHT HANDLE STERIS (MISCELLANEOUS) ×2 IMPLANT
COVER MAYO STAND XLG (MISCELLANEOUS) ×2 IMPLANT
COVER PROBE U/S 5X48 (MISCELLANEOUS) IMPLANT
DECANTER SPIKE VIAL GLASS SM (MISCELLANEOUS) ×2 IMPLANT
DERMABOND ADVANCED .7 DNX12 (GAUZE/BANDAGES/DRESSINGS) ×2 IMPLANT
DRAPE HALF SHEET 40X57 (DRAPES) IMPLANT
ELECT REM PT RETURN 9FT ADLT (ELECTROSURGICAL) ×2
ELECTRODE REM PT RTRN 9FT ADLT (ELECTROSURGICAL) ×2 IMPLANT
GAUZE SPONGE 2X2 8PLY STRL LF (GAUZE/BANDAGES/DRESSINGS) IMPLANT
GAUZE SPONGE 4X4 12PLY STRL (GAUZE/BANDAGES/DRESSINGS) ×2 IMPLANT
GLOVE BIOGEL PI IND STRL 7.0 (GLOVE) ×4 IMPLANT
GLOVE SKINSENSE STRL SZ7.0 (GLOVE) IMPLANT
GLOVE SKINSENSE STRL SZ7.5 (GLOVE) IMPLANT
GLOVE SURG MICRO LTX SZ7.5 (GLOVE) ×2 IMPLANT
GLOVE SURG SS PI 7.0 STRL IVOR (GLOVE) IMPLANT
GOWN STRL REUS W/TWL LRG LVL3 (GOWN DISPOSABLE) ×6 IMPLANT
IV NS 500ML (IV SOLUTION) ×2
IV NS 500ML BAXH (IV SOLUTION) ×2 IMPLANT
KIT BLADEGUARD II DBL (SET/KITS/TRAYS/PACK) ×2 IMPLANT
KIT TURNOVER KIT A (KITS) ×2 IMPLANT
MANIFOLD NEPTUNE II (INSTRUMENTS) ×2 IMPLANT
MARKER SKIN DUAL TIP RULER LAB (MISCELLANEOUS) ×4 IMPLANT
NDL HYPO 18GX1.5 BLUNT FILL (NEEDLE) ×4 IMPLANT
NDL HYPO 25X1 1.5 SAFETY (NEEDLE) ×2 IMPLANT
NEEDLE HYPO 18GX1.5 BLUNT FILL (NEEDLE) ×4 IMPLANT
NEEDLE HYPO 25X1 1.5 SAFETY (NEEDLE) IMPLANT
NS IRRIG 1000ML POUR BTL (IV SOLUTION) ×2 IMPLANT
PACK CV ACCESS (CUSTOM PROCEDURE TRAY) ×2 IMPLANT
PAD ARMBOARD 7.5X6 YLW CONV (MISCELLANEOUS) ×2 IMPLANT
PENCIL SMOKE EVACUATOR COATED (MISCELLANEOUS) IMPLANT
POSITIONER HEAD 8X9X4 ADT (SOFTGOODS) ×2 IMPLANT
SET BASIN LINEN APH (SET/KITS/TRAYS/PACK) ×2 IMPLANT
SOL PREP POV-IOD 4OZ 10% (MISCELLANEOUS) ×2 IMPLANT
SOL PREP PROV IODINE SCRUB 4OZ (MISCELLANEOUS) ×2 IMPLANT
SPONGE GAUZE 2X2 8PLY STRL LF (GAUZE/BANDAGES/DRESSINGS) ×2 IMPLANT
SPONGE T-LAP 18X18 ~~LOC~~+RFID (SPONGE) ×2 IMPLANT
STOCKINETTE 4X48 STRL (DRAPES) IMPLANT
SUT MNCRL AB 4-0 PS2 18 (SUTURE) ×2 IMPLANT
SUT PROLENE 6 0 CC (SUTURE) ×2 IMPLANT
SUT SILK 2 0 SH (SUTURE) IMPLANT
SUT SILK 3 0 (SUTURE)
SUT SILK 3-0 18XBRD TIE 12 (SUTURE) IMPLANT
SUT VIC AB 3-0 SH 27 (SUTURE) ×2
SUT VIC AB 3-0 SH 27X BRD (SUTURE) ×2 IMPLANT
SYR 10ML LL (SYRINGE) ×2 IMPLANT
SYR 50ML LL SCALE MARK (SYRINGE) IMPLANT
SYR BULB IRRIG 60ML STRL (SYRINGE) ×2 IMPLANT
SYR CONTROL 10ML LL (SYRINGE) ×2 IMPLANT
TAPE PAPER 2X10 WHT MICROPORE (GAUZE/BANDAGES/DRESSINGS) IMPLANT
TOWEL OR 17X26 4PK STRL BLUE (TOWEL DISPOSABLE) ×2 IMPLANT
UNDERPAD 30X36 HEAVY ABSORB (UNDERPADS AND DIAPERS) ×2 IMPLANT
WATER STERILE IRR 1000ML POUR (IV SOLUTION) ×2 IMPLANT

## 2022-03-05 NOTE — Op Note (Signed)
    OPERATIVE REPORT  DATE OF SURGERY: 03/05/2022  PATIENT: Tracy Walsh, 56 y.o. female MRN: [id number]  DOB: Aug 06, 1965  PRE-OPERATIVE DIAGNOSIS: End-stage renal disease  POST-OPERATIVE DIAGNOSIS:  Same  PROCEDURE: Right second stage basilic vein transposition, removal of right IJ tunneled hemodialysis catheter  SURGEON:  Curt Jews, M.D.  PHYSICIAN ASSISTANT: Vevelyn Royals, RNFA  The assistant was needed for exposure and to expedite the case  ANESTHESIA: LMA  EBL: per anesthesia record  Total I/O In: 250 [I.V.:50; IV Piggyback:200] Out: 50 [Blood:50]  BLOOD ADMINISTERED: none  DRAINS: none  SPECIMEN: none  COUNTS CORRECT:  YES  PATIENT DISPOSITION:  PACU - hemodynamically stable  PROCEDURE DETAILS: Patient was taken operating placed supine position with area of the right arm right axilla were prepped and draped in usual sterile fashion.  SonoSite ultrasound was used to visualize the basilic vein which was of good caliber and this was marked on the surface of the skin.  Incision was made at the old antecubital incision and carried down to isolate the brachial artery to basilic vein anastomosis.  3 additional incisions were made in the upper arm up to the axilla and the basilic vein was mobilized circumferentially.  Tributary branches were ligated with 3-0 silk ties and divided.  The vein was marked to reduce risk of twisting.  The vein was clamped near the old brachial artery anastomosis and the vein was transected.  The vein was brought out through the tunnel and was gently dilated with heparinized saline was excellent caliber.  A new subcutaneous tunnel was created from the level of the antecubital incision to the axillary incision and the basilic vein was brought through the tunnel.  The vein was sewn end-to-end to the basilic vein near the brachial artery anastomosis with a running 6-0 Prolene suture.  Clamps were removed and excellent thrill was noted.  Wounds irrigated  with saline.  Hemostasis electrocautery.  Wounds were closed with 3-0 Vicryl in the subcutaneous and subcuticular tissue.  Dermabond was applied to the incisions.  Next the area right IJ tunnel catheter was prepped and draped in usual sterile fashion.  Using blunt dissection the Dacron cuff was mobilized at the exit site and the catheter was removed in its entirety.  Pressure was held for hemostasis.  The patient was transferred to the recovery room in stable condition   Rosetta Posner, M.D., [facility] 03/05/2022 10:31 AM  Note: Portions of this report may have been transcribed using voice recognition software.  Every effort has been made to ensure accuracy; however, inadvertent computerized transcription errors may still be present.

## 2022-03-05 NOTE — Interval H&P Note (Signed)
History and Physical Interval Note:  03/05/2022 7:17 AM  Tracy Walsh  has presented today for surgery, with the diagnosis of ESRD.  The various methods of treatment have been discussed with the patient and family. After consideration of risks, benefits and other options for treatment, the patient has consented to  Procedure(s): RIGHT ARM SECOND STAGE BASILIC VEIN TRANSPOSITION (Right) REMOVAL OF A TUNNELED DIALYSIS CATHETER (N/A) as a surgical intervention.  The patient's history has been reviewed, patient examined, no change in status, stable for surgery.  I have reviewed the patient's chart and labs.  Questions were answered to the patient's satisfaction.     Curt Jews

## 2022-03-05 NOTE — Anesthesia Preprocedure Evaluation (Signed)
Anesthesia Evaluation  Patient identified by MRN, date of birth, ID band Patient awake    Reviewed: Allergy & Precautions, H&P , NPO status , Patient's Chart, lab work & pertinent test results, reviewed documented beta blocker date and time   Airway Mallampati: II  TM Distance: >3 FB Neck ROM: full    Dental no notable dental hx.    Pulmonary neg pulmonary ROS, asthma , sleep apnea , COPD, Current Smoker and Patient abstained from smoking.   Pulmonary exam normal breath sounds clear to auscultation       Cardiovascular Exercise Tolerance: Good hypertension, + CAD, + Past MI and +CHF  negative cardio ROS  Rhythm:regular Rate:Normal     Neuro/Psych  Headaches PSYCHIATRIC DISORDERS Anxiety Depression Bipolar Disorder    Neuromuscular disease negative neurological ROS  negative psych ROS   GI/Hepatic negative GI ROS, Neg liver ROS,GERD  ,,(+) Hepatitis -  Endo/Other  negative endocrine ROSdiabetes, Type 2    Renal/GU Renal diseasenegative Renal ROS  negative genitourinary   Musculoskeletal   Abdominal   Peds  Hematology negative hematology ROS (+) Blood dyscrasia, anemia   Anesthesia Other Findings   Reproductive/Obstetrics negative OB ROS                             Anesthesia Physical Anesthesia Plan  ASA: 3  Anesthesia Plan: General LMA   Post-op Pain Management:    Induction:   PONV Risk Score and Plan: Ondansetron  Airway Management Planned:   Additional Equipment:   Intra-op Plan:   Post-operative Plan:   Informed Consent: I have reviewed the patients History and Physical, chart, labs and discussed the procedure including the risks, benefits and alternatives for the proposed anesthesia with the patient or authorized representative who has indicated his/her understanding and acceptance.     Dental Advisory Given  Plan Discussed with: CRNA  Anesthesia Plan Comments:         Anesthesia Quick Evaluation

## 2022-03-05 NOTE — Anesthesia Procedure Notes (Signed)
Procedure Name: LMA Insertion Date/Time: 03/05/2022 7:46 AM  Performed by: Maude Leriche, CRNAPre-anesthesia Checklist: Patient identified, Emergency Drugs available, Suction available, Patient being monitored and Timeout performed Patient Re-evaluated:Patient Re-evaluated prior to induction Oxygen Delivery Method: Circle system utilized Preoxygenation: Pre-oxygenation with 100% oxygen Induction Type: IV induction LMA: LMA inserted LMA Size: 4.0 Number of attempts: 1 Tube secured with: Tape Dental Injury: Teeth and Oropharynx as per pre-operative assessment

## 2022-03-05 NOTE — Telephone Encounter (Signed)
Pa required for vancomycin. #91. Pharmacy states plan only pays for 80 in a 10 day period. I submitted PA through cover my meds and got a NA outcome. States mock claim paid for #46/28 days. Claim pays within plan limitations, max days supply 30. I called pharmacy and pharmacist ran through a 30 day supply and it went through for #74 tablets and will give remainder of rx next month. Called to discuss with patient and left message for her to return my call.

## 2022-03-05 NOTE — Discharge Instructions (Signed)
[facility]  Discharge Instructions  AV Fistula or Graft Surgery for Dialysis Access  Please refer to the following instructions for your post-procedure care. Your surgeon or physician assistant will discuss any changes with you.  Activity  You may drive the day following your surgery, if you are comfortable and no longer taking prescription pain medication. Resume full activity as the soreness in your incision resolves.  Bathing/Showering  You may shower after you go home. Keep your incision dry for 48 hours. Do not soak in a bathtub, hot tub, or swim until the incision heals completely. You may not shower if you have a hemodialysis catheter.  Incision Care  Clean your incision with mild soap and water after 48 hours. Pat the area dry with a clean towel. You do not need a bandage unless otherwise instructed. Do not apply any ointments or creams to your incision. You may have skin glue on your incision. Do not peel it off. It will come off on its own in about one week. Your arm may swell a bit after surgery. To reduce swelling use pillows to elevate your arm so it is above your heart. Your doctor will tell you if you need to lightly wrap your arm with an ACE bandage.  Diet  Resume your normal diet. There are not special food restrictions following this procedure. In order to heal from your surgery, it is CRITICAL to get adequate nutrition. Your body requires vitamins, minerals, and protein. Vegetables are the best source of vitamins and minerals. Vegetables also provide the perfect balance of protein. Processed food has little nutritional value, so try to avoid this.  Medications  Resume taking all of your medications. If your incision is causing pain, you may take over-the counter pain relievers such as acetaminophen (Tylenol). If you were prescribed a stronger pain medication, please be aware these medications can cause nausea and constipation. Prevent  nausea by taking the medication with a snack or meal. Avoid constipation by drinking plenty of fluids and eating foods with high amount of fiber, such as fruits, vegetables, and grains.  Do not take Tylenol if you are taking prescription pain medications.  Follow up Your surgeon may want to see you in the office following your access surgery. If so, this will be arranged at the time of your surgery.  Please call us immediately for any of the following conditions:  Increased pain, redness, drainage (pus) from your incision site Fever of 101 degrees or higher Severe or worsening pain at your incision site Hand pain or numbness.  Reduce your risk of vascular disease:  Stop smoking. If you would like help, call [facility] at 1-800-QUIT-NOW (302) 637-4130) or [place] [facility] [place] your cholesterol Maintain a desired weight Control your diabetes Keep your blood pressure down  Dialysis  It will take several weeks to several months for your new dialysis access to be ready for use. Your surgeon will determine when it is okay to use it. Your nephrologist will continue to direct your dialysis. You can continue to use your Permcath until your new access is ready for use.   03/05/2022 Tracy Walsh [id number] September 29, 1965  Surgeon(s): Moroni Nester, Arvilla Meres, MD  Procedure(s): RIGHT ARM SECOND STAGE BASILIC VEIN TRANSPOSITION REMOVAL OF A TUNNELED DIALYSIS CATHETER   May stick graft immediately   May stick graft on designated area only:    Do not stick fistula for 6 weeks    If you have any questions,  please call the office at [phone].

## 2022-03-05 NOTE — Telephone Encounter (Signed)
Discussed with patient that pharmacy is going to give her a 30 day supply and then she will need to get the remainder next month to make sure she finishes the whole course of antibiotics. She verbalized understanding.

## 2022-03-05 NOTE — Telephone Encounter (Signed)
Patient called back and left vm that she was returning call. I called her back and left vm to return call

## 2022-03-05 NOTE — Transfer of Care (Signed)
Immediate Anesthesia Transfer of Care Note  Patient: Tracy Walsh  Procedure(s) Performed: RIGHT ARM SECOND STAGE BASILIC VEIN TRANSPOSITION (Right: Arm Upper) REMOVAL OF A TUNNELED DIALYSIS CATHETER (Neck)  Patient Location: PACU  Anesthesia Type:General  Level of Consciousness: awake, alert , and oriented  Airway & Oxygen Therapy: Patient Spontanous Breathing  Post-op Assessment: Report given to RN, Post -op Vital signs reviewed and stable, Patient moving all extremities X 4, and Patient able to stick tongue midline  Post vital signs: Reviewed  Last Vitals:  Vitals Value Taken Time  BP 136/55   Temp 98..7   Pulse 57   Resp 10   SpO2 97     Last Pain:  Vitals:   03/05/22 0649  PainSc: 5          Complications: No notable events documented.

## 2022-03-06 NOTE — Anesthesia Postprocedure Evaluation (Signed)
Anesthesia Post Note  Patient: Tracy Walsh  Procedure(s) Performed: RIGHT ARM SECOND STAGE BASILIC VEIN TRANSPOSITION (Right: Arm Upper) REMOVAL OF A TUNNELED DIALYSIS CATHETER (Neck)  Patient location during evaluation: Phase II Anesthesia Type: General Level of consciousness: awake Pain management: pain level controlled Vital Signs Assessment: post-procedure vital signs reviewed and stable Respiratory status: spontaneous breathing and respiratory function stable Cardiovascular status: blood pressure returned to baseline and stable Postop Assessment: no headache and no apparent nausea or vomiting Anesthetic complications: no Comments: Late entry   No notable events documented.   Last Vitals:  Vitals:   03/05/22 1100 03/05/22 1113  BP: (!) 109/55 124/84  Pulse:    Resp: (!) 8 (!) 8  Temp:  37.1 C  SpO2: 96% 100%    Last Pain:  Vitals:   03/05/22 1113  TempSrc: Oral  PainSc: Stamps

## 2022-03-13 ENCOUNTER — Encounter (HOSPITAL_COMMUNITY): Payer: Self-pay | Admitting: Vascular Surgery

## 2022-03-15 DIAGNOSIS — Z419 Encounter for procedure for purposes other than remedying health state, unspecified: Secondary | ICD-10-CM | POA: Diagnosis not present

## 2022-03-21 ENCOUNTER — Encounter: Payer: Self-pay | Admitting: Hematology

## 2022-03-21 ENCOUNTER — Emergency Department (HOSPITAL_COMMUNITY): Payer: 59

## 2022-03-21 ENCOUNTER — Other Ambulatory Visit: Payer: Self-pay

## 2022-03-21 ENCOUNTER — Emergency Department (HOSPITAL_COMMUNITY)
Admission: EM | Admit: 2022-03-21 | Discharge: 2022-03-21 | Disposition: A | Discharge status: Home / Self Care | Payer: 59 | Source: Ambulatory Visit | Attending: Internal Medicine | Admitting: Internal Medicine

## 2022-03-21 DIAGNOSIS — R1084 Generalized abdominal pain: Secondary | ICD-10-CM | POA: Diagnosis not present

## 2022-03-21 DIAGNOSIS — E876 Hypokalemia: Secondary | ICD-10-CM | POA: Diagnosis not present

## 2022-03-21 DIAGNOSIS — Z79899 Other long term (current) drug therapy: Secondary | ICD-10-CM | POA: Insufficient documentation

## 2022-03-21 DIAGNOSIS — Z91040 Latex allergy status: Secondary | ICD-10-CM | POA: Insufficient documentation

## 2022-03-21 DIAGNOSIS — I120 Hypertensive chronic kidney disease with stage 5 chronic kidney disease or end stage renal disease: Secondary | ICD-10-CM | POA: Diagnosis not present

## 2022-03-21 DIAGNOSIS — K659 Peritonitis, unspecified: Secondary | ICD-10-CM | POA: Diagnosis present

## 2022-03-21 DIAGNOSIS — R109 Unspecified abdominal pain: Secondary | ICD-10-CM | POA: Diagnosis not present

## 2022-03-21 DIAGNOSIS — R1110 Vomiting, unspecified: Secondary | ICD-10-CM | POA: Diagnosis not present

## 2022-03-21 LAB — CBC
HCT: 33.7 % — ABNORMAL LOW (ref 36.0–46.0)
Hemoglobin: 11.5 g/dL — ABNORMAL LOW (ref 12.0–15.0)
MCH: 28.8 pg (ref 26.0–34.0)
MCHC: 34.1 g/dL (ref 30.0–36.0)
MCV: 84.3 fL (ref 80.0–100.0)
Platelets: 267 10*3/uL (ref 150–400)
RBC: 4 MIL/uL (ref 3.87–5.11)
RDW: 16.3 % — ABNORMAL HIGH (ref 11.5–15.5)
WBC: 19.8 10*3/uL — ABNORMAL HIGH (ref 4.0–10.5)
nRBC: 0 % (ref 0.0–0.2)

## 2022-03-21 LAB — COMPREHENSIVE METABOLIC PANEL
ALT: 20 U/L (ref 0–44)
AST: 15 U/L (ref 15–41)
Albumin: 2.8 g/dL — ABNORMAL LOW (ref 3.5–5.0)
Alkaline Phosphatase: 127 U/L — ABNORMAL HIGH (ref 38–126)
Anion gap: 16 — ABNORMAL HIGH (ref 5–15)
BUN: 52 mg/dL — ABNORMAL HIGH (ref 6–20)
CO2: 16 mmol/L — ABNORMAL LOW (ref 22–32)
Calcium: 7.9 mg/dL — ABNORMAL LOW (ref 8.9–10.3)
Chloride: 102 mmol/L (ref 98–111)
Creatinine, Ser: 8.85 mg/dL — ABNORMAL HIGH (ref 0.44–1.00)
GFR, Estimated: 5 mL/min — ABNORMAL LOW (ref >60)
Glucose, Bld: 151 mg/dL — ABNORMAL HIGH (ref 70–99)
Potassium: 2.6 mmol/L — CL (ref 3.5–5.1)
Sodium: 134 mmol/L — ABNORMAL LOW (ref 135–145)
Total Bilirubin: 0.4 mg/dL (ref 0.3–1.2)
Total Protein: 7.6 g/dL (ref 6.5–8.1)

## 2022-03-21 LAB — LIPASE, BLOOD: Lipase: 280 U/L — ABNORMAL HIGH (ref 11–51)

## 2022-03-21 LAB — LACTIC ACID, PLASMA: Lactic Acid, Venous: 1.1 mmol/L (ref 0.5–1.9)

## 2022-03-21 MED ORDER — FENTANYL CITRATE PF 50 MCG/ML IJ SOSY
50.0000 ug | PREFILLED_SYRINGE | Freq: Once | INTRAMUSCULAR | Status: AC
  Administered 2022-03-21: 50 ug via INTRAVENOUS
  Filled 2022-03-21: qty 1

## 2022-03-21 MED ORDER — SODIUM CHLORIDE 0.9 % IV BOLUS
500.0000 mL | Freq: Once | INTRAVENOUS | Status: AC
  Administered 2022-03-21: 500 mL via INTRAVENOUS

## 2022-03-21 MED ORDER — POTASSIUM CHLORIDE 10 MEQ/100ML IV SOLN
10.0000 meq | INTRAVENOUS | Status: AC
  Administered 2022-03-21 (×4): 10 meq via INTRAVENOUS
  Filled 2022-03-21: qty 100

## 2022-03-21 MED ORDER — FENTANYL CITRATE PF 50 MCG/ML IJ SOSY
25.0000 ug | PREFILLED_SYRINGE | Freq: Once | INTRAMUSCULAR | Status: AC
  Administered 2022-03-21: 25 ug via INTRAVENOUS
  Filled 2022-03-21: qty 1

## 2022-03-21 MED ORDER — ONDANSETRON HCL 4 MG/2ML IJ SOLN
4.0000 mg | Freq: Once | INTRAMUSCULAR | Status: AC
  Administered 2022-03-21: 4 mg via INTRAVENOUS
  Filled 2022-03-21: qty 2

## 2022-03-21 MED ORDER — METOCLOPRAMIDE HCL 5 MG/ML IJ SOLN
10.0000 mg | Freq: Once | INTRAMUSCULAR | Status: AC
  Administered 2022-03-21: 10 mg via INTRAVENOUS
  Filled 2022-03-21: qty 2

## 2022-03-21 NOTE — ED Triage Notes (Signed)
Pt presents with vomiting and weakness, that started last night, currently being treated with oral Vanc for C-diff, sent by dialysis for evaluation.

## 2022-03-21 NOTE — ED Provider Notes (Signed)
[facility] EMERGENCY DEPARTMENT Provider Note   CSN: [id number] Arrival date & time: 03/21/22  1333     History  Chief Complaint  Patient presents with   Abdominal Pain    Tracy Walsh is a 56 y.o. female.  HPI Patient presents with abdominal pain, nausea, vomiting. Patient is here with her husband who assists with the history.  History is notable for peritoneal dialysis, recurrent episodes of C. difficile colitis including 1 for which she is currently receiving vancomycin.  She is 3 weeks into course of this oral medication.  She notes that during this time she has had persistent and worsening diffuse abdominal pain.  Inability to tolerate p.o., persistent nausea, vomiting, and frequent loose stool.    Home Medications Prior to Admission medications   Medication Sig Start Date End Date Taking? Authorizing Provider  albuterol (PROVENTIL) (2.5 MG/3ML) 0.083% nebulizer solution Take 3 mLs (2.5 mg total) by nebulization every 6 (six) hours as needed for wheezing or shortness of breath. 12/28/21  Yes [name], MD  cyanocobalamin (,VITAMIN B-12,) 1000 MCG/ML injection Inject 1,000 mcg into the muscle every 30 (thirty) days. 09/17/21  Yes [provider]  dicyclomine (BENTYL) 10 MG capsule TAKE 1 CAPSULE (10 MG TOTAL) BY MOUTH 2 (TWO) TIMES DAILY AS NEEDED FOR SPASMS. 01/24/22  Yes Carlan, Chelsea L, NP  ferrous sulfate 325 (65 FE) MG EC tablet Take 325 mg by mouth daily with breakfast. 02/19/21  Yes [provider]  gabapentin (NEURONTIN) 100 MG capsule Take 2 capsules (200 mg total) by mouth at bedtime. Patient taking differently: Take 200 mg by mouth 2 (two) times daily. 12/22/21 03/22/22 Yes Elgergawy, Silver Huguenin, MD  GVOKE HYPOPEN 1-PACK 1 MG/0.2ML SOAJ INJECT 1 MG INTO THE SKIN AS NEEDED (SEVERE HYPOGLYCEMIA). Patient taking differently: Inject 1 mg into the skin daily as needed (severe hypoglycemia). 01/25/22  Yes [name], MD  isosorbide mononitrate  (IMDUR) 60 MG 24 hr tablet Take 1 tablet (60 mg total) by mouth daily. Patient taking differently: Take 30 mg by mouth daily. 02/27/22 05/28/22 Yes [name], MD  loperamide (IMODIUM) 2 MG capsule Take 2 mg by mouth as needed for diarrhea or loose stools.   Yes [provider]  Melatonin 10 MG CAPS Take 1 capsule by mouth daily.   Yes [provider]  metoprolol succinate (TOPROL-XL) 50 MG 24 hr tablet TAKE 1 TABLET BY MOUTH EVERY DAY 02/28/22  Yes [name], MD  omeprazole (PRILOSEC) 20 MG capsule Take 20 mg by mouth daily.   Yes [provider]  ondansetron (ZOFRAN) 4 MG tablet Take 1 tablet (4 mg total) by mouth daily as needed for nausea or vomiting. 12/22/21 12/22/22 Yes Elgergawy, Silver Huguenin, MD  potassium chloride SA (KLOR-CON M) 20 MEQ tablet Take 20 mEq by mouth 2 (two) times daily. 02/25/22  Yes [provider]  promethazine (PHENERGAN) 12.5 MG tablet Take 12.5 mg by mouth every 6 (six) hours as needed. 03/12/22  Yes [provider]  sertraline (ZOLOFT) 50 MG tablet TAKE HALF A TABLET (25MG) ONCE DAILY. Patient taking differently: Take 25 mg by mouth daily. Take half a tablet (83m) once daily. 02/20/22  Yes SJacquelynn Cree MD  sevelamer carbonate (RENVELA) 800 MG tablet Take 1 tablet (800 mg total) by mouth 3 (three) times daily with meals. 12/22/21  Yes Elgergawy, DSilver Huguenin MD  vancomycin (VANCOCIN) 125 MG capsule TAKE 1 CAPSULE (125 MG TOTAL) BY MOUTH 4 (FOUR) TIMES  DAILY FOR 14 DAYS, THEN 1 CAPSULE (125 MG TOTAL) 2 (TWO) TIMES DAILY FOR 7 DAYS, THEN 1 CAPSULE (125 MG TOTAL) DAILY FOR 7 DAYS, THEN 1 CAPSULE (125 MG TOTAL) EVERY OTHER DAY FOR 28 DAYS. 03/04/22 04/29/22 Yes Harvel Quale, MD  blood glucose meter kit and supplies Dispense based on patient and insurance preference. Use up to four times daily as directed. (FOR ICD-10 E10.9, E11.9). 01/25/22   [name], MD  furosemide (LASIX) 80 MG tablet Please take 80 mg of  Lasix on none HD days on Sunday, Monday, Wednesday and Friday Patient not taking: Reported on 03/21/2022 12/22/21   Elgergawy, Silver Huguenin, MD  mupirocin ointment (BACTROBAN) 2 % Apply 1 Application topically daily. Patient not taking: Reported on 03/21/2022 10/31/21   Suzan Slick, NP  oxyCODONE-acetaminophen (PERCOCET) 5-325 MG tablet Take 1 tablet by mouth every 6 (six) hours as needed for severe pain. Patient not taking: Reported on 03/21/2022 03/05/22   Rosetta Posner, MD      Allergies    Amoxicillin, Clarithromycin, Codeine, Hydrocodone, Moxifloxacin, Penicillins, Sulfa antibiotics, Nitrofurantoin, Crab (diagnostic), and Latex    Review of Systems   Review of Systems  All other systems reviewed and are negative.   Physical Exam Updated Vital Signs BP 112/77   Pulse 84   Temp 97.9 F (36.6 C) (Oral)   Resp 18   Ht _0  (1.727 m)   Wt 77.1 kg   SpO2 100%   BMI 25.84 kg/m  Physical Exam Vitals and nursing note reviewed.  Constitutional:      General: She is not in acute distress.    Appearance: She is well-developed.  HENT:     Head: Normocephalic and atraumatic.  Eyes:     Conjunctiva/sclera: Conjunctivae normal.  Cardiovascular:     Rate and Rhythm: Normal rate and regular rhythm.  Pulmonary:     Effort: Pulmonary effort is normal. No respiratory distress.     Breath sounds: Normal breath sounds. No stridor.  Abdominal:     General: There is no distension.     Tenderness: There is generalized abdominal tenderness.  Skin:    General: Skin is warm and dry.  Neurological:     Mental Status: She is alert and oriented to person, place, and time.     Cranial Nerves: No cranial nerve deficit.  Psychiatric:        Mood and Affect: Mood normal.     ED Results / Procedures / Treatments   Labs (all labs ordered are listed, but only abnormal results are displayed) Labs Reviewed  LIPASE, BLOOD - Abnormal; Notable for the following components:      Result Value   Lipase  280 (*)    All other components within normal limits  COMPREHENSIVE METABOLIC PANEL - Abnormal; Notable for the following components:   Sodium 134 (*)    Potassium 2.6 (*)    CO2 16 (*)    Glucose, Bld 151 (*)    BUN 52 (*)    Creatinine, Ser 8.85 (*)    Calcium 7.9 (*)    Albumin 2.8 (*)    Alkaline Phosphatase 127 (*)    GFR, Estimated 5 (*)    Anion gap 16 (*)    All other components within normal limits  CBC - Abnormal; Notable for the following components:   WBC 19.8 (*)    Hemoglobin 11.5 (*)    HCT 33.7 (*)    RDW 16.3 (*)  All other components within normal limits  LACTIC ACID, PLASMA  URINALYSIS, ROUTINE W REFLEX MICROSCOPIC    EKG EKG Interpretation  Date/Time:  Thursday March 21 2022 14:15:48 EST Ventricular Rate:  74 PR Interval:  182 QRS Duration: 132 QT Interval:  414 QTC Calculation: 459 R Axis:   -62 Text Interpretation: Normal sinus rhythm Right atrial enlargement Left axis deviation Rightward axis Abnormal ECG Confirmed by Carmin Muskrat [phone]) on 03/21/2022 4:24:05 PM  Radiology CT ABDOMEN PELVIS WO CONTRAST  Result Date: 03/21/2022 CLINICAL DATA:  Peritonitis perforation suspected, vomiting, weakness, currently undergoing treatment for C diff EXAM: CT ABDOMEN AND PELVIS WITHOUT CONTRAST TECHNIQUE: Multidetector CT imaging of the abdomen and pelvis was performed following the standard protocol without IV contrast. RADIATION DOSE REDUCTION: This exam was performed according to the departmental dose-optimization program which includes automated exposure control, adjustment of the mA and/or kV according to patient size and/or use of iterative reconstruction technique. COMPARISON:  08/29/2021 FINDINGS: Lower chest: Bronchial wall thickening in the bilateral lung bases with extensively scattered centrilobular ground-glass nodularity (series 4, image 9). Hepatobiliary: No focal liver abnormality is seen. Hepatomegaly, maximum coronal span 22.6 cm. Status  post cholecystectomy. No biliary dilatation. Pancreas: Unremarkable. No pancreatic ductal dilatation or surrounding inflammatory changes. Spleen: Normal in size without significant abnormality. Benign granulomatous calcifications of the splenic parenchyma. Adrenals/Urinary Tract: Adrenal glands are unremarkable. Kidneys are normal, without renal calculi, solid lesion, or hydronephrosis. Bladder is unremarkable. Stomach/Bowel: Stomach is within normal limits. Appendix appears normal. No evidence of bowel wall thickening, distention, or inflammatory changes. Vascular/Lymphatic: Aortic atherosclerosis. No enlarged abdominal or pelvic lymph nodes. Reproductive: Status post hysterectomy. Other: No abdominal wall hernia or abnormality. Tenckhoff type peritoneal dialysis catheter in the low pelvis. Minimal dialysate in the pelvis (series 2, image 76) Musculoskeletal: No acute or significant osseous findings. IMPRESSION: 1. No acute noncontrast CT findings of the abdomen or pelvis to explain abdominal pain. Specifically, no imaging evidence of colitis or perforation. 2. Tenckhoff type peritoneal dialysis catheter in the low pelvis. Minimal dialysate in the pelvis. 3. Hepatomegaly. 4. Bronchial wall thickening in the bilateral lung bases with extensively scattered centrilobular ground-glass nodularity, consistent with nonspecific infection or inflammation. Aortic Atherosclerosis (ICD10-I70.0). Electronically Signed   By: Delanna Ahmadi M.D.   On: 03/21/2022 18:47    Procedures Procedures    Medications Ordered in ED Medications  potassium chloride 10 mEq in 100 mL IVPB (10 mEq Intravenous New Bag/Given 03/21/22 1901)  sodium chloride 0.9 % bolus 500 mL (500 mLs Intravenous New Bag/Given 03/21/22 1607)  ondansetron (ZOFRAN) injection 4 mg (4 mg Intravenous Given 03/21/22 1608)  fentaNYL (SUBLIMAZE) injection 25 mcg (25 mcg Intravenous Given 03/21/22 1608)  fentaNYL (SUBLIMAZE) injection 50 mcg (50 mcg Intravenous Given  03/21/22 1643)  metoCLOPramide (REGLAN) injection 10 mg (10 mg Intravenous Given 03/21/22 1749)    ED Course/ Medical Decision Making/ A&P                           Medical Decision Making Patient with multiple medical issues most notably end-stage renal disease on peritoneal dialysis, presents with abdominal pain, nausea, vomiting, p.o. intolerance and frequent loose stool.  Differential including peritonitis, electrolyte abnormalities, dehydration, bacteremia, sepsi, intra-abdominal abscess, pancreatitis all considered.  CT, labs, fluids ordered.  Amount and/or Complexity of Data Reviewed Independent Historian: spouse External Data Reviewed: notes.    Details: End-stage renal disease peritoneal dialysis Labs: ordered. Decision-making details documented in ED Course. Radiology:  ordered. Decision-making details documented in ED Course. ECG/medicine tests:  Decision-making details documented in ED Course.  Risk Prescription drug management. Parenteral controlled substances. Decision regarding hospitalization.  Update: Initial labs notable for hypokalemia, potassium 2.6, lipase 280, present demonstration of the patient's end-stage renal disease.  Fluid resuscitation started, potassium repletion started, analgesics, antiemetics provided.  7:20 PM Patient in similar concern, somewhat better after fluids, initiation of potassium repletion.  I discussed her case with Dr. Posey Pronto our nephrology colleague.  With concern for peritonitis patient would benefit from sample obtained from her peritoneal dialysis catheter, this is not a service available at this facility, the patient will require transfer to [facility] for this.  Nephrology is aware, will see her at that facility. Other findings notable for elevated lipase level, likely contributing to her ongoing pain, as well as anion gap, leukocytosis all concerning for hypermetabolic state, though she has no lactic acidosis.  Patient will not  start very antibiotics, with a goal of obtaining dialysis sample on arrival to [facility] after transfer.  Patient may require ED to ED transfer to facilitate this.  Update:, Though the patient was encouraged multiple times, as were her husband and that she requires admission given her multiple abnormalities, documented above, she elected to leave, states that she would like to follow-up with her own nephrologist, tomorrow.  She was, however, amenable to only staying for potassium repletion, after we emphasized the importance of this in regards to her relative stability to pursue that tomorrow, though again urging her to stay here for further evaluation, monitoring, management, including likely transfer to our advanced care facility with additional nephrology resources.  Patient left, per her request, after she and her husband discussed this.   Final Clinical Impression(s) / ED Diagnoses Final diagnoses:  Peritonitis ([place])  Hypokalemia   CRITICAL CARE Performed by: Carmin Muskrat Total critical care time: 40 minutes Critical care time was exclusive of separately billable procedures and treating other patients. Critical care was necessary to treat or prevent imminent or life-threatening deterioration. Critical care was time spent personally by me on the following activities: development of treatment plan with patient and/or surrogate as well as nursing, discussions with consultants, evaluation of patient's response to treatment, examination of patient, obtaining history from patient or surrogate, ordering and performing treatments and interventions, ordering and review of laboratory studies, ordering and review of radiographic studies, pulse oximetry and re-evaluation of patient's condition.    Carmin Muskrat, MD 03/21/22 [phone]

## 2022-03-21 NOTE — ED Notes (Signed)
Pt doesn't make urine  

## 2022-03-21 NOTE — ED Notes (Signed)
Patient assisted to bedside commode.

## 2022-03-21 NOTE — Discharge Instructions (Signed)
As discussed, although you have elected to leave rather than to be admitted, as well as our medical recommendation, do not hesitate to return here for additional evaluation.  Otherwise, be sure to follow-up with your nephrologist or your primary care physician as soon as possible.

## 2022-03-27 ENCOUNTER — Ambulatory Visit: Admitting: Internal Medicine

## 2022-03-28 ENCOUNTER — Emergency Department (HOSPITAL_COMMUNITY): Payer: 59

## 2022-03-28 ENCOUNTER — Encounter (HOSPITAL_COMMUNITY): Payer: Self-pay | Admitting: Emergency Medicine

## 2022-03-28 ENCOUNTER — Observation Stay (HOSPITAL_COMMUNITY)
Admission: EM | Admit: 2022-03-28 | Discharge: 2022-03-29 | Disposition: A | Discharge status: Home / Self Care | Payer: 59 | Attending: Internal Medicine | Admitting: Internal Medicine

## 2022-03-28 ENCOUNTER — Other Ambulatory Visit: Payer: Self-pay

## 2022-03-28 DIAGNOSIS — L03119 Cellulitis of unspecified part of limb: Secondary | ICD-10-CM | POA: Diagnosis not present

## 2022-03-28 DIAGNOSIS — M797 Fibromyalgia: Secondary | ICD-10-CM | POA: Diagnosis present

## 2022-03-28 DIAGNOSIS — I120 Hypertensive chronic kidney disease with stage 5 chronic kidney disease or end stage renal disease: Secondary | ICD-10-CM | POA: Insufficient documentation

## 2022-03-28 DIAGNOSIS — E11628 Type 2 diabetes mellitus with other skin complications: Secondary | ICD-10-CM | POA: Diagnosis not present

## 2022-03-28 DIAGNOSIS — R0602 Shortness of breath: Secondary | ICD-10-CM | POA: Diagnosis not present

## 2022-03-28 DIAGNOSIS — E785 Hyperlipidemia, unspecified: Secondary | ICD-10-CM | POA: Diagnosis present

## 2022-03-28 DIAGNOSIS — L97429 Non-pressure chronic ulcer of left heel and midfoot with unspecified severity: Secondary | ICD-10-CM | POA: Diagnosis present

## 2022-03-28 DIAGNOSIS — M7989 Other specified soft tissue disorders: Secondary | ICD-10-CM | POA: Diagnosis not present

## 2022-03-28 DIAGNOSIS — R079 Chest pain, unspecified: Secondary | ICD-10-CM | POA: Diagnosis not present

## 2022-03-28 DIAGNOSIS — R197 Diarrhea, unspecified: Secondary | ICD-10-CM | POA: Diagnosis present

## 2022-03-28 DIAGNOSIS — Z79899 Other long term (current) drug therapy: Secondary | ICD-10-CM | POA: Diagnosis not present

## 2022-03-28 DIAGNOSIS — I252 Old myocardial infarction: Secondary | ICD-10-CM

## 2022-03-28 DIAGNOSIS — L97509 Non-pressure chronic ulcer of other part of unspecified foot with unspecified severity: Secondary | ICD-10-CM | POA: Insufficient documentation

## 2022-03-28 DIAGNOSIS — E1165 Type 2 diabetes mellitus with hyperglycemia: Secondary | ICD-10-CM | POA: Diagnosis not present

## 2022-03-28 DIAGNOSIS — J9610 Chronic respiratory failure, unspecified whether with hypoxia or hypercapnia: Secondary | ICD-10-CM | POA: Diagnosis present

## 2022-03-28 DIAGNOSIS — I5042 Chronic combined systolic (congestive) and diastolic (congestive) heart failure: Secondary | ICD-10-CM | POA: Diagnosis present

## 2022-03-28 DIAGNOSIS — E8729 Other acidosis: Secondary | ICD-10-CM | POA: Diagnosis present

## 2022-03-28 DIAGNOSIS — L97419 Non-pressure chronic ulcer of right heel and midfoot with unspecified severity: Secondary | ICD-10-CM | POA: Diagnosis present

## 2022-03-28 DIAGNOSIS — E876 Hypokalemia: Secondary | ICD-10-CM | POA: Diagnosis present

## 2022-03-28 DIAGNOSIS — L03115 Cellulitis of right lower limb: Secondary | ICD-10-CM | POA: Diagnosis not present

## 2022-03-28 DIAGNOSIS — Z992 Dependence on renal dialysis: Secondary | ICD-10-CM | POA: Diagnosis not present

## 2022-03-28 DIAGNOSIS — Z882 Allergy status to sulfonamides status: Secondary | ICD-10-CM

## 2022-03-28 DIAGNOSIS — Z043 Encounter for examination and observation following other accident: Secondary | ICD-10-CM | POA: Diagnosis not present

## 2022-03-28 DIAGNOSIS — J449 Chronic obstructive pulmonary disease, unspecified: Secondary | ICD-10-CM | POA: Insufficient documentation

## 2022-03-28 DIAGNOSIS — E1122 Type 2 diabetes mellitus with diabetic chronic kidney disease: Secondary | ICD-10-CM | POA: Insufficient documentation

## 2022-03-28 DIAGNOSIS — N186 End stage renal disease: Secondary | ICD-10-CM

## 2022-03-28 DIAGNOSIS — I132 Hypertensive heart and chronic kidney disease with heart failure and with stage 5 chronic kidney disease, or end stage renal disease: Secondary | ICD-10-CM | POA: Diagnosis present

## 2022-03-28 DIAGNOSIS — A419 Sepsis, unspecified organism: Secondary | ICD-10-CM | POA: Diagnosis not present

## 2022-03-28 DIAGNOSIS — J4489 Other specified chronic obstructive pulmonary disease: Secondary | ICD-10-CM | POA: Diagnosis present

## 2022-03-28 DIAGNOSIS — I10 Essential (primary) hypertension: Secondary | ICD-10-CM | POA: Diagnosis present

## 2022-03-28 DIAGNOSIS — E11621 Type 2 diabetes mellitus with foot ulcer: Secondary | ICD-10-CM | POA: Diagnosis not present

## 2022-03-28 DIAGNOSIS — F418 Other specified anxiety disorders: Secondary | ICD-10-CM | POA: Diagnosis present

## 2022-03-28 DIAGNOSIS — Z91040 Latex allergy status: Secondary | ICD-10-CM | POA: Diagnosis not present

## 2022-03-28 DIAGNOSIS — F319 Bipolar disorder, unspecified: Secondary | ICD-10-CM | POA: Diagnosis present

## 2022-03-28 DIAGNOSIS — Z885 Allergy status to narcotic agent status: Secondary | ICD-10-CM

## 2022-03-28 DIAGNOSIS — A0471 Enterocolitis due to Clostridium difficile, recurrent: Secondary | ICD-10-CM | POA: Diagnosis present

## 2022-03-28 DIAGNOSIS — N2581 Secondary hyperparathyroidism of renal origin: Secondary | ICD-10-CM | POA: Diagnosis present

## 2022-03-28 DIAGNOSIS — R0789 Other chest pain: Secondary | ICD-10-CM | POA: Diagnosis not present

## 2022-03-28 DIAGNOSIS — E872 Acidosis, unspecified: Secondary | ICD-10-CM | POA: Diagnosis present

## 2022-03-28 DIAGNOSIS — F17210 Nicotine dependence, cigarettes, uncomplicated: Secondary | ICD-10-CM | POA: Diagnosis present

## 2022-03-28 DIAGNOSIS — Z888 Allergy status to other drugs, medicaments and biological substances status: Secondary | ICD-10-CM

## 2022-03-28 DIAGNOSIS — M79671 Pain in right foot: Secondary | ICD-10-CM | POA: Diagnosis not present

## 2022-03-28 DIAGNOSIS — K219 Gastro-esophageal reflux disease without esophagitis: Secondary | ICD-10-CM | POA: Diagnosis present

## 2022-03-28 DIAGNOSIS — G6181 Chronic inflammatory demyelinating polyneuritis: Secondary | ICD-10-CM | POA: Diagnosis present

## 2022-03-28 DIAGNOSIS — D631 Anemia in chronic kidney disease: Secondary | ICD-10-CM | POA: Diagnosis present

## 2022-03-28 DIAGNOSIS — R600 Localized edema: Secondary | ICD-10-CM | POA: Diagnosis not present

## 2022-03-28 DIAGNOSIS — T1490XA Injury, unspecified, initial encounter: Secondary | ICD-10-CM | POA: Diagnosis not present

## 2022-03-28 DIAGNOSIS — L03116 Cellulitis of left lower limb: Secondary | ICD-10-CM | POA: Diagnosis not present

## 2022-03-28 DIAGNOSIS — Z880 Allergy status to penicillin: Secondary | ICD-10-CM

## 2022-03-28 DIAGNOSIS — I2510 Atherosclerotic heart disease of native coronary artery without angina pectoris: Secondary | ICD-10-CM | POA: Diagnosis present

## 2022-03-28 LAB — CBC
HCT: 30.5 % — ABNORMAL LOW (ref 36.0–46.0)
Hemoglobin: 10.5 g/dL — ABNORMAL LOW (ref 12.0–15.0)
MCH: 28.4 pg (ref 26.0–34.0)
MCHC: 34.4 g/dL (ref 30.0–36.0)
MCV: 82.4 fL (ref 80.0–100.0)
Platelets: 304 10*3/uL (ref 150–400)
RBC: 3.7 MIL/uL — ABNORMAL LOW (ref 3.87–5.11)
RDW: 16.3 % — ABNORMAL HIGH (ref 11.5–15.5)
WBC: 28.1 10*3/uL — ABNORMAL HIGH (ref 4.0–10.5)
nRBC: 0 % (ref 0.0–0.2)

## 2022-03-28 LAB — GLUCOSE, CAPILLARY: Glucose-Capillary: 241 mg/dL — ABNORMAL HIGH (ref 70–99)

## 2022-03-28 LAB — BETA-HYDROXYBUTYRIC ACID: Beta-Hydroxybutyric Acid: 0.05 mmol/L (ref 0.05–0.27)

## 2022-03-28 LAB — BLOOD GAS, VENOUS
Acid-base deficit: 14 mmol/L — ABNORMAL HIGH (ref 0.0–2.0)
Bicarbonate: 13.4 mmol/L — ABNORMAL LOW (ref 20.0–28.0)
Drawn by: 67337
O2 Saturation: 75.1 %
Patient temperature: 36.9
pCO2, Ven: 36 mmHg — ABNORMAL LOW (ref 44–60)
pH, Ven: 7.18 — CL (ref 7.25–7.43)
pO2, Ven: 50 mmHg — ABNORMAL HIGH (ref 32–45)

## 2022-03-28 LAB — TROPONIN I (HIGH SENSITIVITY)
Troponin I (High Sensitivity): 67 ng/L — ABNORMAL HIGH (ref <18)
Troponin I (High Sensitivity): 60 ng/L — ABNORMAL HIGH (ref <18)

## 2022-03-28 LAB — BASIC METABOLIC PANEL
Anion gap: 20 — ABNORMAL HIGH (ref 5–15)
BUN: 53 mg/dL — ABNORMAL HIGH (ref 6–20)
CO2: 12 mmol/L — ABNORMAL LOW (ref 22–32)
Calcium: 7 mg/dL — ABNORMAL LOW (ref 8.9–10.3)
Chloride: 102 mmol/L (ref 98–111)
Creatinine, Ser: 10.06 mg/dL — ABNORMAL HIGH (ref 0.44–1.00)
GFR, Estimated: 4 mL/min — ABNORMAL LOW (ref >60)
Glucose, Bld: 254 mg/dL — ABNORMAL HIGH (ref 70–99)
Potassium: 2.6 mmol/L — CL (ref 3.5–5.1)
Sodium: 134 mmol/L — ABNORMAL LOW (ref 135–145)

## 2022-03-28 LAB — C-REACTIVE PROTEIN: CRP: 14.1 mg/dL — ABNORMAL HIGH (ref <1.0)

## 2022-03-28 LAB — LACTIC ACID, PLASMA
Lactic Acid, Venous: 1.4 mmol/L (ref 0.5–1.9)
Lactic Acid, Venous: 1.2 mmol/L (ref 0.5–1.9)

## 2022-03-28 LAB — BRAIN NATRIURETIC PEPTIDE: B Natriuretic Peptide: 299 pg/mL — ABNORMAL HIGH (ref 0.0–100.0)

## 2022-03-28 LAB — MAGNESIUM: Magnesium: 1.6 mg/dL — ABNORMAL LOW (ref 1.7–2.4)

## 2022-03-28 MED ORDER — SODIUM CHLORIDE 0.9 % IV BOLUS
1000.0000 mL | Freq: Once | INTRAVENOUS | Status: AC
  Administered 2022-03-28: 1000 mL via INTRAVENOUS

## 2022-03-28 MED ORDER — NITROGLYCERIN 0.4 MG SL SUBL
0.4000 mg | SUBLINGUAL_TABLET | Freq: Once | SUBLINGUAL | Status: AC
  Administered 2022-03-28: 0.4 mg via SUBLINGUAL
  Filled 2022-03-28: qty 1

## 2022-03-28 MED ORDER — MAGNESIUM SULFATE 2 GM/50ML IV SOLN
2.0000 g | Freq: Once | INTRAVENOUS | Status: AC
  Administered 2022-03-28: 2 g via INTRAVENOUS
  Filled 2022-03-28: qty 50

## 2022-03-28 MED ORDER — DICYCLOMINE HCL 10 MG PO CAPS: 10.0000 mg | ORAL_CAPSULE | Freq: Two times a day (BID) | ORAL | Status: DC | PRN

## 2022-03-28 MED ORDER — VANCOMYCIN HCL 1500 MG/300ML IV SOLN
1500.0000 mg | Freq: Once | INTRAVENOUS | Status: AC
  Administered 2022-03-28: 1500 mg via INTRAVENOUS
  Filled 2022-03-28: qty 300

## 2022-03-28 MED ORDER — SEVELAMER CARBONATE 800 MG PO TABS
800.0000 mg | ORAL_TABLET | Freq: Three times a day (TID) | ORAL | Status: DC
  Administered 2022-03-29: 800 mg via ORAL
  Filled 2022-03-28 (×2): qty 1

## 2022-03-28 MED ORDER — ONDANSETRON HCL 4 MG PO TABS: 4.0000 mg | ORAL_TABLET | Freq: Four times a day (QID) | ORAL | Status: DC | PRN

## 2022-03-28 MED ORDER — ONDANSETRON HCL 4 MG/2ML IJ SOLN
4.0000 mg | Freq: Once | INTRAMUSCULAR | Status: AC
  Administered 2022-03-28: 4 mg via INTRAVENOUS
  Filled 2022-03-28: qty 2

## 2022-03-28 MED ORDER — MELATONIN 5 MG PO TABS
10.0000 mg | ORAL_TABLET | Freq: Every day | ORAL | Status: DC
  Filled 2022-03-28: qty 2

## 2022-03-28 MED ORDER — POLYETHYLENE GLYCOL 3350 17 G PO PACK: 17.0000 g | PACK | Freq: Every day | ORAL | Status: DC | PRN

## 2022-03-28 MED ORDER — METOPROLOL SUCCINATE ER 50 MG PO TB24
50.0000 mg | ORAL_TABLET | Freq: Every day | ORAL | Status: DC
  Administered 2022-03-29: 50 mg via ORAL
  Filled 2022-03-28: qty 1

## 2022-03-28 MED ORDER — MORPHINE SULFATE (PF) 4 MG/ML IV SOLN
4.0000 mg | Freq: Once | INTRAVENOUS | Status: AC
  Administered 2022-03-28: 4 mg via INTRAVENOUS
  Filled 2022-03-28: qty 1

## 2022-03-28 MED ORDER — ACETAMINOPHEN 650 MG RE SUPP: 650.0000 mg | Freq: Four times a day (QID) | RECTAL | Status: DC | PRN

## 2022-03-28 MED ORDER — SODIUM BICARBONATE 8.4 % IV SOLN
50.0000 meq | Freq: Once | INTRAVENOUS | Status: AC
  Administered 2022-03-28: 50 meq via INTRAVENOUS
  Filled 2022-03-28: qty 50

## 2022-03-28 MED ORDER — ACETAMINOPHEN 325 MG PO TABS: 650.0000 mg | ORAL_TABLET | Freq: Four times a day (QID) | ORAL | Status: DC | PRN

## 2022-03-28 MED ORDER — HEPARIN SODIUM (PORCINE) 5000 UNIT/ML IJ SOLN
5000.0000 [IU] | Freq: Three times a day (TID) | INTRAMUSCULAR | Status: DC
  Administered 2022-03-29 (×2): 5000 [IU] via SUBCUTANEOUS
  Filled 2022-03-28 (×2): qty 1

## 2022-03-28 MED ORDER — DIPHENHYDRAMINE HCL 25 MG PO CAPS
25.0000 mg | ORAL_CAPSULE | Freq: Once | ORAL | Status: AC
  Administered 2022-03-28: 25 mg via ORAL
  Filled 2022-03-28: qty 1

## 2022-03-28 MED ORDER — SODIUM CHLORIDE 0.9 % IV SOLN
2.0000 g | Freq: Once | INTRAVENOUS | Status: AC
  Administered 2022-03-28: 2 g via INTRAVENOUS
  Filled 2022-03-28: qty 12.5

## 2022-03-28 MED ORDER — ONDANSETRON HCL 4 MG PO TABS: 4.0000 mg | ORAL_TABLET | Freq: Once | ORAL | Status: DC

## 2022-03-28 MED ORDER — HYDROMORPHONE HCL 1 MG/ML IJ SOLN
0.5000 mg | INTRAMUSCULAR | Status: DC | PRN
  Administered 2022-03-28 – 2022-03-29 (×4): 0.5 mg via INTRAVENOUS
  Filled 2022-03-28 (×4): qty 0.5

## 2022-03-28 MED ORDER — POTASSIUM CHLORIDE 10 MEQ/100ML IV SOLN
10.0000 meq | Freq: Once | INTRAVENOUS | Status: AC
  Administered 2022-03-28: 10 meq via INTRAVENOUS
  Filled 2022-03-28: qty 100

## 2022-03-28 MED ORDER — INSULIN ASPART 100 UNIT/ML IJ SOLN: 0.0000 [IU] | Freq: Three times a day (TID) | INTRAMUSCULAR | Status: DC

## 2022-03-28 MED ORDER — VANCOMYCIN HCL 125 MG PO CAPS: 125.0000 mg | ORAL_CAPSULE | Freq: Four times a day (QID) | ORAL | Status: DC

## 2022-03-28 MED ORDER — POTASSIUM CHLORIDE CRYS ER 20 MEQ PO TBCR
40.0000 meq | EXTENDED_RELEASE_TABLET | Freq: Once | ORAL | Status: AC
  Administered 2022-03-28: 40 meq via ORAL
  Filled 2022-03-28: qty 2

## 2022-03-28 MED ORDER — ONDANSETRON 4 MG PO TBDP
4.0000 mg | ORAL_TABLET | Freq: Once | ORAL | Status: DC
  Filled 2022-03-28 (×2): qty 1

## 2022-03-28 MED ORDER — ONDANSETRON HCL 4 MG/2ML IJ SOLN: 4.0000 mg | Freq: Four times a day (QID) | INTRAMUSCULAR | Status: DC | PRN

## 2022-03-28 MED ORDER — INSULIN ASPART 100 UNIT/ML IJ SOLN: 0.0000 [IU] | Freq: Every day | INTRAMUSCULAR | Status: DC

## 2022-03-28 MED ORDER — ISOSORBIDE MONONITRATE ER 30 MG PO TB24
30.0000 mg | ORAL_TABLET | Freq: Every day | ORAL | Status: DC
  Administered 2022-03-29: 30 mg via ORAL
  Filled 2022-03-28: qty 1

## 2022-03-28 NOTE — Assessment & Plan Note (Signed)
Diagnosed with C. difficile diarrhea and started on oral vancomycin 3 weeks ago.  She is to complete a 56-day course.

## 2022-03-28 NOTE — ED Triage Notes (Signed)
Pt c/o cp, neck pain and bilateral leg pain.  Pain to mid chest, no radiation, steady pressure per pt started yesterday that is constant. C/o sweating/nausea/sob. Pt is non diaphoretic at this time. No resp distress or sob noted in triage.  Ble swelling and redness noted , x 3 days per pt.  Pt on home dialysis.  Pt c/o all over neck pain started yesterday. Rom wnl.

## 2022-03-28 NOTE — ED Notes (Signed)
CBG @ 241

## 2022-03-28 NOTE — Assessment & Plan Note (Addendum)
She has not missed any HD sessions.  She completes her 9 hours every night.  Today anion gap is elevated at 20, with serum bicarb of 12.  VBG shows pH of 7.18, pCO2 of 36.  He has only PD access.  No HD access.  Has been on dialysis for about 6 months. -Admit to [facility] for [facility] -1 amp sodium bicarb 50 mEq -Resume renal meds -I Spoke to Dr. Royce Macadamia, nephrology team will see in consult at [facility], No need to be aggressive with bicarb/correcting acidosis.  Okay with just to 1 amp that has been given.  Repeat K and if less than 3 give 20 oral.

## 2022-03-28 NOTE — ED Provider Notes (Signed)
[facility] EMERGENCY DEPARTMENT Provider Note   CSN: [id number] Arrival date & time: 03/28/22  1229     History  Chief Complaint  Patient presents with   Chest Pain    Tracy Walsh is a 56 y.o. female. With past medical history of CAD, CHF, end-stage renal disease on peritoneal dialysis, COPD, uncontrolled diabetes, hypertension who presents to the emergency department with chest pain.  Patient states that symptoms began yesterday. She describes having central, non-radiating, tight chest pain. She has had associated nausea without vomiting. She states yesterday she had palpitations and she is having mild shortness of breath. Additionally, she notes that her legs are very painful. This has been going on the past few days. She states her husband noted they were red. She states even her pants laying over her legs is very painful. She has also noted swelling in her legs, worse in the right. She states she has a history of heart failure but was taken off of diuretics last year because she "didn't need it." She notes she has chronic wounds to her heels. Additionally, notes recurrent clostridium difficile infection and is on oral vancomycin. She denies having abdominal pain, dysuria. Last PD last night without difficulty.    Chest Pain Associated symptoms: nausea, palpitations and shortness of breath   Associated symptoms: no cough, no fever and no vomiting        Home Medications Prior to Admission medications   Medication Sig Start Date End Date Taking? Authorizing Provider  albuterol (PROVENTIL) (2.5 MG/3ML) 0.083% nebulizer solution Take 3 mLs (2.5 mg total) by nebulization every 6 (six) hours as needed for wheezing or shortness of breath. 12/28/21  Yes [name], MD  cyanocobalamin (,VITAMIN B-12,) 1000 MCG/ML injection Inject 1,000 mcg into the muscle every 30 (thirty) days. 09/17/21  Yes [provider]  dicyclomine (BENTYL) 10 MG capsule TAKE 1 CAPSULE (10 MG TOTAL)  BY MOUTH 2 (TWO) TIMES DAILY AS NEEDED FOR SPASMS. 01/24/22  Yes Carlan, Chelsea L, NP  ferrous sulfate 325 (65 FE) MG EC tablet Take 325 mg by mouth daily with breakfast. 02/19/21  Yes [provider]  gabapentin (NEURONTIN) 100 MG capsule Take 2 capsules (200 mg total) by mouth at bedtime. Patient taking differently: Take 200 mg by mouth 2 (two) times daily. 12/22/21 03/28/22 Yes Elgergawy, Silver Huguenin, MD  isosorbide mononitrate (IMDUR) 60 MG 24 hr tablet Take 1 tablet (60 mg total) by mouth daily. Patient taking differently: Take 30 mg by mouth daily. 02/27/22 05/28/22 Yes [name], MD  loperamide (IMODIUM) 2 MG capsule Take 2 mg by mouth as needed for diarrhea or loose stools.   Yes [provider]  Melatonin 10 MG CAPS Take 1 capsule by mouth daily.   Yes [provider]  metoprolol succinate (TOPROL-XL) 50 MG 24 hr tablet TAKE 1 TABLET BY MOUTH EVERY DAY 02/28/22  Yes [name], MD  mupirocin ointment (BACTROBAN) 2 % Apply 1 Application topically daily. 10/31/21  Yes Suzan Slick, NP  omeprazole (PRILOSEC) 20 MG capsule Take 20 mg by mouth daily.   Yes [provider]  ondansetron (ZOFRAN) 4 MG tablet Take 1 tablet (4 mg total) by mouth daily as needed for nausea or vomiting. 12/22/21 12/22/22 Yes Elgergawy, Silver Huguenin, MD  potassium chloride SA (KLOR-CON M) 20 MEQ tablet Take 20 mEq by mouth 2 (two) times daily. 02/25/22  Yes [provider]  Probiotic Product (PROBIOTIC GUMMIES PO) Take 1 tablet by mouth  every evening.   Yes [provider]  promethazine (PHENERGAN) 12.5 MG tablet Take 12.5 mg by mouth every 6 (six) hours as needed. 03/12/22  Yes [provider]  sertraline (ZOLOFT) 50 MG tablet TAKE HALF A TABLET (25MG) ONCE DAILY. Patient taking differently: Take 25 mg by mouth daily. Take half a tablet (24m) once daily. 02/20/22  Yes SJacquelynn Cree MD  sevelamer carbonate (RENVELA) 800 MG tablet Take 1 tablet (800 mg  total) by mouth 3 (three) times daily with meals. 12/22/21  Yes Elgergawy, DSilver Huguenin MD  vancomycin (VANCOCIN) 125 MG capsule TAKE 1 CAPSULE (125 MG TOTAL) BY MOUTH 4 (FOUR) TIMES DAILY FOR 14 DAYS, THEN 1 CAPSULE (125 MG TOTAL) 2 (TWO) TIMES DAILY FOR 7 DAYS, THEN 1 CAPSULE (125 MG TOTAL) DAILY FOR 7 DAYS, THEN 1 CAPSULE (125 MG TOTAL) EVERY OTHER DAY FOR 28 DAYS. 03/04/22 04/29/22 Yes CHarvel Quale MD  blood glucose meter kit and supplies Dispense based on patient and insurance preference. Use up to four times daily as directed. (FOR ICD-10 E10.9, E11.9). 01/25/22   DJohnette Abraham MD  furosemide (LASIX) 80 MG tablet Please take 80 mg of Lasix on none HD days on Sunday, Monday, Wednesday and Friday Patient not taking: Reported on 03/21/2022 12/22/21   Elgergawy, DSilver Huguenin MD  GVOKE HYPOPEN 1-PACK 1 MG/0.2ML SOAJ INJECT 1 MG INTO THE SKIN AS NEEDED (SEVERE HYPOGLYCEMIA). Patient not taking: Reported on 03/28/2022 01/25/22   DJohnette Abraham MD  oxyCODONE-acetaminophen (PERCOCET) 5-325 MG tablet Take 1 tablet by mouth every 6 (six) hours as needed for severe pain. Patient not taking: Reported on 03/21/2022 03/05/22   ERosetta Posner MD      Allergies    Amoxicillin, Clarithromycin, Codeine, Hydrocodone, Moxifloxacin, Penicillins, Sulfa antibiotics, Nitrofurantoin, Crab (diagnostic), and Latex    Review of Systems   Review of Systems  Constitutional:  Negative for fever.  Respiratory:  Positive for shortness of breath. Negative for cough.   Cardiovascular:  Positive for chest pain, palpitations and leg swelling.  Gastrointestinal:  Positive for nausea. Negative for vomiting.  All other systems reviewed and are negative.   Physical Exam Updated Vital Signs BP 119/66   Pulse 79   Temp 98.2 F (36.8 C) (Oral)   Resp 12   SpO2 96%  Physical Exam Vitals and nursing note reviewed.  Constitutional:      Appearance: Normal appearance. She is obese. She is ill-appearing.  HENT:      Head: Normocephalic.  Eyes:     General: No scleral icterus.    Pupils: Pupils are equal, round, and reactive to light.  Cardiovascular:     Rate and Rhythm: Normal rate and regular rhythm.     Pulses:          Radial pulses are 2+ on the right side and 2+ on the left side.     Heart sounds: Normal heart sounds. No murmur heard.    Comments: Pitting edema R>L in BLE. There is cellulitis over both lower extremities up to the tibial tuberosities. Redness R>L. Bilateral wounds to the heels R>L. Appear non-healing.  She has a wound in her right groin that appears fungal  Pulmonary:     Effort: Tachypnea present. No respiratory distress.     Breath sounds: Decreased breath sounds present.  Chest:     Chest wall: No tenderness.  Abdominal:     General: Bowel sounds are normal.     Palpations: Abdomen is  soft.  Musculoskeletal:     Right lower leg: Tenderness present. Edema present.     Left lower leg: Tenderness present. Edema present.  Skin:    General: Skin is warm and dry.     Capillary Refill: Capillary refill takes less than 2 seconds.     Findings: Erythema present.  Neurological:     General: No focal deficit present.     Mental Status: She is alert and oriented to person, place, and time.  Psychiatric:        Mood and Affect: Mood normal.        Behavior: Behavior normal.     ED Results / Procedures / Treatments   Labs (all labs ordered are listed, but only abnormal results are displayed) Labs Reviewed  BASIC METABOLIC PANEL - Abnormal; Notable for the following components:      Result Value   Sodium 134 (*)    Potassium 2.6 (*)    CO2 12 (*)    Glucose, Bld 254 (*)    BUN 53 (*)    Creatinine, Ser 10.06 (*)    Calcium 7.0 (*)    GFR, Estimated 4 (*)    Anion gap 20 (*)    All other components within normal limits  CBC - Abnormal; Notable for the following components:   WBC 28.1 (*)    RBC 3.70 (*)    Hemoglobin 10.5 (*)    HCT 30.5 (*)    RDW 16.3 (*)     All other components within normal limits  GLUCOSE, CAPILLARY - Abnormal; Notable for the following components:   Glucose-Capillary 241 (*)    All other components within normal limits  BRAIN NATRIURETIC PEPTIDE - Abnormal; Notable for the following components:   B Natriuretic Peptide 299.0 (*)    All other components within normal limits  SEDIMENTATION RATE - Abnormal; Notable for the following components:   Sed Rate 141 (*)    All other components within normal limits  BLOOD GAS, VENOUS - Abnormal; Notable for the following components:   pH, Ven 7.18 (*)    pCO2, Ven 36 (*)    pO2, Ven 50 (*)    Bicarbonate 13.4 (*)    Acid-base deficit 14.0 (*)    All other components within normal limits  MAGNESIUM - Abnormal; Notable for the following components:   Magnesium 1.6 (*)    All other components within normal limits  TROPONIN I (HIGH SENSITIVITY) - Abnormal; Notable for the following components:   Troponin I (High Sensitivity) 67 (*)    All other components within normal limits  TROPONIN I (HIGH SENSITIVITY) - Abnormal; Notable for the following components:   Troponin I (High Sensitivity) 60 (*)    All other components within normal limits  CULTURE, BLOOD (ROUTINE X 2)  CULTURE, BLOOD (ROUTINE X 2)  LACTIC ACID, PLASMA  LACTIC ACID, PLASMA  BETA-HYDROXYBUTYRIC ACID  C-REACTIVE PROTEIN  CBG MONITORING, ED    EKG EKG Interpretation  Date/Time:  Thursday March 28 2022 13:08:51 EST Ventricular Rate:  88 PR Interval:  184 QRS Duration: 132 QT Interval:  420 QTC Calculation: 508 R Axis:   -36 Text Interpretation: Normal sinus rhythm Right atrial enlargement Left axis deviation Left ventricular hypertrophy with QRS widening ( R in aVL , Cornell product , Romhilt-Estes ) Cannot rule out Septal infarct (cited on or before 28-Mar-2022) Abnormal ECG When compared with ECG of 21-Mar-2022 14:15, T wave inversion no longer evident in Inferior leads QT has  lengthened No significant  change since last tracing Confirmed by Isla Pence [phone]) on 03/28/2022 3:27:03 PM  Radiology CT CHEST ABDOMEN PELVIS WO CONTRAST  Result Date: 03/28/2022 CLINICAL DATA:  Sepsis. Neck pain after fall. Mid chest pain. Fall comparison EXAM: CT CHEST, ABDOMEN AND PELVIS WITHOUT CONTRAST TECHNIQUE: Multidetector CT imaging of the chest, abdomen and pelvis was performed following the standard protocol without IV contrast. RADIATION DOSE REDUCTION: This exam was performed according to the departmental dose-optimization program which includes automated exposure control, adjustment of the mA and/or kV according to patient size and/or use of iterative reconstruction technique. COMPARISON:  None Available. FINDINGS: CT CHEST FINDINGS CT CHEST FINDINGS Cardiovascular: No evidence of aortic injury on noncontrast exam. Coronary stent noted. No pericardial fluid Mediastinum/Nodes: No axillary or supraclavicular adenopathy. No mediastinal or hilar adenopathy. No pericardial fluid. Esophagus normal. Lungs/Pleura: No pulmonary contusion or pleural fluid. No pneumothorax. Airspace disease. There are bilateral lower lobe patchy ground-glass densities (image 128/series 4/RIGHT lower lobe) (image 121/series 4/LEFT lower lobe). Similar findings in the inferior RIGHT middle lobe. Musculoskeletal: No aggressive osseous lesion. CT ABDOMEN AND PELVIS FINDINGS Hepatobiliary: No focal hepatic lesion. No biliary ductal dilatation. Gallbladder is normal. Common bile duct is normal. Pancreas: Pancreas is normal. No ductal dilatation. No pancreatic inflammation. Spleen: Normal spleen Adrenals/urinary tract: Adrenal glands and kidneys are normal. Ureters and bladder normal. Dialysate catheter coiled the pelvis.  Small amount fluid the pelvis Stomach/Bowel: Stomach, small bowel, appendix, and cecum are normal. The colon and rectosigmoid colon are normal. Vascular/Lymphatic: Abdominal aorta is normal caliber. There is no retroperitoneal or  periportal lymphadenopathy. No pelvic lymphadenopathy. Reproductive: Uterus and adnexa unremarkable. Other: Small volume free fluid the pelvis Musculoskeletal: No aggressive osseous lesion. Lucency within the posterior iliac bones is favored late osteopenia degenerative change (image 99/3. Posterior lumbar fusion. IMPRESSION: Chest Impression: 1. No evidence of thoracic trauma. 2. Scattered ground-glass densities at the lung bases are favored atelectasis or edema. Infection felt less likely. Abdomen / Pelvis Impression: 1. No evidence of trauma in the abdomen pelvis. 2. Small free fluid related to peritoneal dialysis. Electronically Signed   By: Suzy Bouchard M.D.   On: 03/28/2022 17:05   CT Cervical Spine Wo Contrast  Result Date: 03/28/2022 CLINICAL DATA:  Trauma EXAM: CT CERVICAL SPINE WITHOUT CONTRAST TECHNIQUE: Multidetector CT imaging of the cervical spine was performed without intravenous contrast. Multiplanar CT image reconstructions were also generated. RADIATION DOSE REDUCTION: This exam was performed according to the departmental dose-optimization program which includes automated exposure control, adjustment of the mA and/or kV according to patient size and/or use of iterative reconstruction technique. COMPARISON:  None Available. FINDINGS: Alignment: Normal. Skull base and vertebrae: No acute fracture. No primary bone lesion or focal pathologic process. Severe facet degenerative change at C2-C3 left. Soft tissues and spinal canal: No prevertebral fluid or swelling. No visible canal hematoma. Disc levels:  No evidence of high-grade spinal canal stenosis. Upper chest: No focal opacity. Left-sided central venous catheter in place. Other: None. IMPRESSION: No acute fracture or traumatic malalignment of the cervical spine. Electronically Signed   By: Marin Roberts M.D.   On: 03/28/2022 16:47   US Venous Img Lower Right (DVT Study)  Result Date: 03/28/2022 CLINICAL DATA:  Right lower extremity  pain and swelling for 2 days. EXAM: RIGHT LOWER EXTREMITY VENOUS DOPPLER ULTRASOUND TECHNIQUE: Gray-scale sonography with compression, as well as color and duplex ultrasound, were performed to evaluate the deep venous system(s) from the level of the common femoral vein  through the popliteal and proximal calf veins. COMPARISON:  None Available. FINDINGS: VENOUS Normal compressibility of the common femoral, superficial femoral, and popliteal veins, as well as the visualized calf veins. Visualized portions of profunda femoral vein and great saphenous vein unremarkable. No filling defects to suggest DVT on grayscale or color Doppler imaging. Doppler waveforms show normal direction of venous flow, normal respiratory plasticity and response to augmentation. Limited views of the contralateral common femoral vein are unremarkable. OTHER Subcutaneous soft tissue edema about the calf. Limitations: none IMPRESSION: 1. No evidence of right lower extremity DVT. 2. Subcutaneous soft tissue edema about the calf. Electronically Signed   By: Keane Police D.O.   On: 03/28/2022 15:33   DG Foot Complete Right  Result Date: 03/28/2022 CLINICAL DATA:  Swelling and pain in right foot. Evaluate for osteomyelitis. EXAM: RIGHT FOOT COMPLETE - 3+ VIEW COMPARISON:  Right foot radiographs 08/29/2021 FINDINGS: Small plantar and minimal posterior calcaneal heel spurs, unchanged. Mild dorsal talonavicular degenerative osteophytes. No acute fracture or dislocation. No cortical erosion is seen. No subcutaneous air. Moderate vascular calcifications. Possible soft tissue wound at the posterior plantar aspect of the heel, similar to prior. IMPRESSION: Possible soft tissue wound at the posterior plantar aspect of the heel, similar to prior. No cortical erosion to indicate radiographic evidence of osteomyelitis. Electronically Signed   By: Yvonne Kendall M.D.   On: 03/28/2022 14:29   DG Chest Port 1 View  Result Date: 03/28/2022 CLINICAL DATA:   Shortness of breath. EXAM: PORTABLE CHEST 1 VIEW COMPARISON:  AP chest 12/18/2021 FINDINGS: Left chest wall porta catheter tip again overlies the central superior vena cava. Spinal cord stimulator electrodes again overlie the lower thoracic spine. Mild dextrocurvature of the mid thoracic spine. Cardiac silhouette and mediastinal contours are within limits. The lungs are clear. No pleural effusion or pneumothorax. IMPRESSION: No active disease. Electronically Signed   By: Yvonne Kendall M.D.   On: 03/28/2022 14:27    Procedures .Critical Care  Performed by: Mickie Hillier, PA-C Authorized by: Mickie Hillier, PA-C   Critical care provider statement:    Critical care time (minutes):  45   Critical care time was exclusive of:  Separately billable procedures and treating other patients   Critical care was necessary to treat or prevent imminent or life-threatening deterioration of the following conditions:  Renal failure, sepsis and metabolic crisis   Critical care was time spent personally by me on the following activities:  Development of treatment plan with patient or surrogate, discussions with consultants, discussions with primary provider, evaluation of patient's response to treatment, examination of patient, interpretation of cardiac output measurements, obtaining history from patient or surrogate, review of old charts, re-evaluation of patient's condition, pulse oximetry, ordering and review of radiographic studies, ordering and review of laboratory studies and ordering and performing treatments and interventions   I assumed direction of critical care for this patient from another provider in my specialty: no     Care discussed with: admitting provider      Medications Ordered in ED Medications  ondansetron (ZOFRAN-ODT) disintegrating tablet 4 mg (4 mg Oral Not Given 03/28/22 1455)  magnesium sulfate IVPB 2 g 50 mL (2 g Intravenous New Bag/Given 03/28/22 1819)  potassium chloride 10 mEq in 100  mL IVPB (10 mEq Intravenous New Bag/Given 03/28/22 1823)  sodium bicarbonate injection 50 mEq (has no administration in time range)  morphine (PF) 4 MG/ML injection 4 mg (4 mg Intravenous Given 03/28/22 1454)  ondansetron (ZOFRAN)  injection 4 mg (4 mg Intravenous Given 03/28/22 1454)  nitroGLYCERIN (NITROSTAT) SL tablet 0.4 mg (0.4 mg Sublingual Given 03/28/22 1450)  ceFEPIme (MAXIPIME) 2 g in sodium chloride 0.9 % 100 mL IVPB (0 g Intravenous Stopped 03/28/22 1540)  vancomycin (VANCOREADY) IVPB 1500 mg/300 mL (1,500 mg Intravenous New Bag/Given 03/28/22 1550)  morphine (PF) 4 MG/ML injection 4 mg (4 mg Intravenous Given 03/28/22 1648)  sodium chloride 0.9 % bolus 1,000 mL (1,000 mLs Intravenous New Bag/Given 03/28/22 1648)    ED Course/ Medical Decision Making/ A&P                           Medical Decision Making Amount and/or Complexity of Data Reviewed Labs: ordered. Radiology: ordered.  Risk Prescription drug management. Decision regarding hospitalization.  Initial Impression and Ddx 56 year old female who presents to the emergency department initially for central chest pain, shortness of breath and palpitations. She was also noted to have lower extremity swelling and redness consistent with cellulitis bilaterally. We initiated ACS work up as well as obtaining plain films to rule out osteomyelitis particularly on the right heel and DVT study of the right leg given the increased swelling Patient PMH that increases complexity of ED encounter:  CAD, COPD, CHF, ESRD on PD Differential is broad including sepsis, ACS, PE, PNA, DVT, etc.   Interpretation of Diagnostics I independent reviewed and interpreted the labs as followed: WBC 28, K 2.6, mag 1.6, lactic negative, BHB negative, troponin 67>60, pH 7.18. BNP 299. ESR 141  - I independently visualized the following imaging with scope of interpretation limited to determining acute life threatening conditions related to emergency care:  CXR, plain film right foot, DVT study, CT CAP, which revealed see above results section  Patient Reassessment and Ultimate Disposition/Management After initial exam, physical was concerning for cellulitis of the lower extremities. She also has wound to the bilateral heels. She has R>L calf swelling and DVT study was performed and negative.  She was given nitroglycerin for CP which did not improve her symptoms. She was then given morphine and zofran which helped to improve her leg pain bilaterally.   Her labs resulted with significant leukocytosis to 28, increased from 19 1 week ago. She is not on chronic steroids. There was concern last week for peritonitis, but she states she has not had fever, abdominal pain, or difficulty with PD. She is non tender on my exam. She is chronically treated for cdiff but this does not explain acute rise. Could be from her bilateral cellulitis but obtained a CT CAP to rule out occult infection. There was no findings of pneumonia or intraabdominal source. She does not make urine. There is no crepitus. I have palpated all skin surfaces without evidence for necrotizing fasciitis.  Additionally, K is 2.6 which was low last week. I added on a magnesium which was also low, replacing both.  She was found to be acidotic on her pH to 7.18. I think this is metabolically driven from her ESRD. Her BHB is negative so doubt DKA. Lactic is negative. No reported ingestions like salicylates, acetaminophen, alcohols. She is not impaired. Blood cultures were obtained as well.  Overall she has multiple problems requiring admission. CP with negative ACS work up. She has slight elevation in BNP. Could be partly demand related. Doubt acute PE. No evidence of PTX, PNA. Symptoms inconsistent with dissection, tamponade.  - Cellulitis - on IV antibiotics. No evidence of osteo at this time.  -  metabolic acidosis: ?ESRD?  - hypokalemia - on IV replacement  Consulted and spoke with Dr. Denton Brick  who agrees to admit patient.   Patient management required discussion with the following services or consulting groups:  Hospitalist Service  Complexity of Problems Addressed Acute illness or injury that poses threat of life of bodily function  Additional Data Reviewed and Analyzed Further history obtained from: Further history from spouse/family member, Past medical history and medications listed in the EMR, Prior ED visit notes, Recent PCP notes, Recent Consult notes, Care Everywhere, and Prior labs/imaging results  Patient Encounter Risk Assessment Prescriptions, SDOH impact on management, and Consideration of hospitalization  Final Clinical Impression(s) / ED Diagnoses Final diagnoses:  Nonspecific chest pain    Rx / DC Orders ED Discharge Orders     None         Mickie Hillier, PA-C 03/28/22 1834    Teressa Lower, MD 03/28/22 [phone]

## 2022-03-28 NOTE — Assessment & Plan Note (Signed)
Blood pressure stable. -Resume metoprolol, Imdur,

## 2022-03-28 NOTE — Progress Notes (Signed)
Pharmacy Note:  Initial antibiotic(s) regimen of Vancomycin ordered by EDP to treat sepsis.  Estimated Creatinine Clearance: 6.9 mL/min (A) (by C-G formula based on SCr of 10.06 mg/dL (H)).   Allergies  Allergen Reactions   Amoxicillin Anaphylaxis   Clarithromycin Anaphylaxis, Diarrhea, Itching, Nausea And Vomiting and Rash   Codeine Itching   Hydrocodone Itching   Moxifloxacin Anaphylaxis, Anxiety, Hives, Itching, Nausea And Vomiting, Palpitations and Shortness Of Breath   Penicillins Anaphylaxis   Sulfa Antibiotics Anaphylaxis   Nitrofurantoin Nausea And Vomiting   Crab (Diagnostic) Hives   Latex Hives, Itching, Rash and Swelling    Vitals:   03/28/22 1500 03/28/22 1501  BP:  131/70  Pulse: 83   Resp:  16  Temp:    SpO2: 100%     Anti-infectives (From admission, onward)    Start     Dose/Rate Route Frequency Ordered Stop   03/28/22 1600  vancomycin (VANCOREADY) IVPB 1500 mg/300 mL        1,500 mg 150 mL/hr over 120 Minutes Intravenous  Once 03/28/22 1524     03/28/22 1500  ceFEPIme (MAXIPIME) 2 g in sodium chloride 0.9 % 100 mL IVPB        2 g 200 mL/hr over 30 Minutes Intravenous  Once 03/28/22 1445 03/28/22 1540        Assessment/Plan: For sepsis, patient with CRI and on peritoneal dialysis Initial dose(s) of Vancomycin 1500mg  IV  X 1 ordered.and cefepime 2gm IV x 1 F/U admission orders for further dosing if therapy continued.  Cristy Friedlander, [facility] 03/28/2022 4:32 PM

## 2022-03-28 NOTE — Progress Notes (Signed)
Pharmacy Antibiotic Note  Tracy Walsh is a 56 y.o. female with ESRD on PD admitted on 03/28/2022 with cellulitis.  Pharmacy has been consulted for Vancomycin dosing.  Vancomycin 1500 mg IV given in ED at 4 pm.   Plan: No further doses needed at this time due to renal function F/U Vancomycin level in 3-4 days and redose as indicated.     Temp (24hrs), Avg:98.2 F (36.8 C), Min:98.2 F (36.8 C), Max:98.2 F (36.8 C)  Recent Labs  Lab 03/28/22 1343 03/28/22 1510  WBC 28.1*  --   CREATININE 10.06*  --   LATICACIDVEN 1.4 1.2    Estimated Creatinine Clearance: 6.9 mL/min (A) (by C-G formula based on SCr of 10.06 mg/dL (H)).    Allergies  Allergen Reactions   Amoxicillin Anaphylaxis   Clarithromycin Anaphylaxis, Diarrhea, Itching, Nausea And Vomiting and Rash   Codeine Itching   Hydrocodone Itching   Moxifloxacin Anaphylaxis, Anxiety, Hives, Itching, Nausea And Vomiting, Palpitations and Shortness Of Breath   Penicillins Anaphylaxis   Sulfa Antibiotics Anaphylaxis   Nitrofurantoin Nausea And Vomiting   Crab (Diagnostic) Hives   Latex Hives, Itching, Rash and Swelling    Caryl Pina 03/28/2022 11:26 PM

## 2022-03-28 NOTE — H&P (Signed)
History and Physical    Tracy Walsh FGH:[id number] DOB: May 16, 1965 DOA: 03/28/2022  PCP: [name], MD   Patient coming from: Home  I have personally briefly reviewed patient's old medical records in [place]  Chief Complaint: Bilateral leg pain   HPI: Tracy Walsh is a 56 y.o. female with medical history significant for ESRD on peritoneal dialysis, COPD with chronic respiratory failure, hypertension, depression, diabetes mellitus, systolic and diastolic CHF, CIPD.  Patient presented to the ED today with complaints of bilateral lower extremity swelling worse on the right over the past 3 days associated severe pain, redness more on the right.  No fevers no chills no vomiting no loose stools no abdominal pain no chest pain. She has chronic ulcers to bilateral heels, she ambulates with a walker.  ED Course: Impression 98.2.  Heart rate 70s to 90s.  Respiratory rate 11-18.  Systolic blood pressure 678 and 56.  O2 sats greater than 96% and on room air. Leukocytosis of 28.1.  Potassium 2.6.  Magnesium 1.5. Anion gap elevated at 20, serum bicarb of 12. Hospitalist to admit for cellulitis, started on IV ceftriaxone.  Review of Systems: As per HPI all other systems reviewed and negative.  Past Medical History:  Diagnosis Date   Anemia    Anxiety    Asthma    Bipolar disorder ([place]) 2018   type 2   CHF (congestive heart failure) (HCC)    CIDP (chronic inflammatory demyelinating polyneuropathy) (HCC)    CKD (chronic kidney disease)    COPD (chronic obstructive pulmonary disease) ([place])    Depression    Depression with anxiety 12/16/2021   Depression, recurrent ([place]) 12/07/2021   Diabetes mellitus without complication (HCC)    type 2   Diabetic foot ulcers (HCC)    Fibromyalgia    GERD (gastroesophageal reflux disease)    Hepatitis    Hep as a child - patient can give blood   History of blood transfusion 08/2021   1 unit per patient   History of  esophagogastroduodenoscopy (EGD) 2018   History of kidney stones    passed stones   HLD (hyperlipidemia)    Hypertension    Migraines    Myocardial infarction (HCC)    x 2   Neuropathy    legs and feet - bilateral   Sleep apnea 01/25/2022    Past Surgical History:  Procedure Laterality Date   APPENDECTOMY     AV FISTULA PLACEMENT Right 01/15/2022   Procedure: RIGHT ARM ARTERIOVENOUS (AV) FISTULA CREATION;  Surgeon: Rosetta Posner, MD;  Location: [facility];  Service: Vascular;  Laterality: Right;   [place] Right 03/05/2022   Procedure: RIGHT ARM SECOND STAGE BASILIC VEIN TRANSPOSITION;  Surgeon: Rosetta Posner, MD;  Location: [facility];  Service: Vascular;  Laterality: Right;   BILATERAL NASAL FRACTURE CLOSED REDUCTION  10/2020   CAPD INSERTION N/A 02/01/2022   Procedure: LAPAROSCOPIC INSERTION CONTINUOUS AMBULATORY PERITONEAL DIALYSIS  (CAPD) CATHETER;  Surgeon: Jules Husbands, MD;  Location: [facility];  Service: General;  Laterality: N/A;   CHOLECYSTECTOMY     COLONOSCOPY     CORONARY ANGIOPLASTY WITH STENT PLACEMENT  01/15/2021   FOOT SURGERY Right    I & D   I & D EXTREMITY Left 09/28/2021   Procedure: LEFT HEEL DEBRIDEMENT AND TISSUE GRAFT;  Surgeon: Newt Minion, MD;  Location: [facility];  Service: Orthopedics;  Laterality: Left;   IR FLUORO GUIDE CV LINE RIGHT  12/20/2021  IR US GUIDE VASC ACCESS RIGHT  12/20/2021   REMOVAL OF A DIALYSIS CATHETER N/A 03/05/2022   Procedure: REMOVAL OF A TUNNELED DIALYSIS CATHETER;  Surgeon: Rosetta Posner, MD;  Location: [facility];  Service: Vascular;  Laterality: N/A;   SHOULDER SURGERY Bilateral    SPINAL FUSION     TOTAL ABDOMINAL HYSTERECTOMY  1995   UPPER GI ENDOSCOPY       reports that she has been smoking cigarettes. She has been smoking an average of .5 packs per day. She has never used smokeless tobacco. She reports that she does not drink alcohol and does not use drugs.  Allergies  Allergen Reactions   Amoxicillin  Anaphylaxis   Clarithromycin Anaphylaxis, Diarrhea, Itching, Nausea And Vomiting and Rash   Codeine Itching   Hydrocodone Itching   Moxifloxacin Anaphylaxis, Anxiety, Hives, Itching, Nausea And Vomiting, Palpitations and Shortness Of Breath   Penicillins Anaphylaxis   Sulfa Antibiotics Anaphylaxis   Nitrofurantoin Nausea And Vomiting   Crab (Diagnostic) Hives   Latex Hives, Itching, Rash and Swelling    History reviewed. No pertinent family history.   Prior to Admission medications   Medication Sig Start Date End Date Taking? Authorizing Provider  albuterol (PROVENTIL) (2.5 MG/3ML) 0.083% nebulizer solution Take 3 mLs (2.5 mg total) by nebulization every 6 (six) hours as needed for wheezing or shortness of breath. 12/28/21  Yes [name], MD  cyanocobalamin (,VITAMIN B-12,) 1000 MCG/ML injection Inject 1,000 mcg into the muscle every 30 (thirty) days. 09/17/21  Yes [provider]  dicyclomine (BENTYL) 10 MG capsule TAKE 1 CAPSULE (10 MG TOTAL) BY MOUTH 2 (TWO) TIMES DAILY AS NEEDED FOR SPASMS. 01/24/22  Yes Carlan, Chelsea L, NP  ferrous sulfate 325 (65 FE) MG EC tablet Take 325 mg by mouth daily with breakfast. 02/19/21  Yes [provider]  gabapentin (NEURONTIN) 100 MG capsule Take 2 capsules (200 mg total) by mouth at bedtime. Patient taking differently: Take 200 mg by mouth 2 (two) times daily. 12/22/21 03/28/22 Yes Elgergawy, Silver Huguenin, MD  isosorbide mononitrate (IMDUR) 60 MG 24 hr tablet Take 1 tablet (60 mg total) by mouth daily. Patient taking differently: Take 30 mg by mouth daily. 02/27/22 05/28/22 Yes [name], MD  loperamide (IMODIUM) 2 MG capsule Take 2 mg by mouth as needed for diarrhea or loose stools.   Yes [provider]  Melatonin 10 MG CAPS Take 1 capsule by mouth daily.   Yes [provider]  metoprolol succinate (TOPROL-XL) 50 MG 24 hr tablet TAKE 1 TABLET BY MOUTH EVERY DAY 02/28/22  Yes [name], MD  mupirocin  ointment (BACTROBAN) 2 % Apply 1 Application topically daily. 10/31/21  Yes Suzan Slick, NP  omeprazole (PRILOSEC) 20 MG capsule Take 20 mg by mouth daily.   Yes [provider]  ondansetron (ZOFRAN) 4 MG tablet Take 1 tablet (4 mg total) by mouth daily as needed for nausea or vomiting. 12/22/21 12/22/22 Yes Elgergawy, Silver Huguenin, MD  potassium chloride SA (KLOR-CON M) 20 MEQ tablet Take 20 mEq by mouth 2 (two) times daily. 02/25/22  Yes [provider]  Probiotic Product (PROBIOTIC GUMMIES PO) Take 1 tablet by mouth every evening.   Yes [provider]  promethazine (PHENERGAN) 12.5 MG tablet Take 12.5 mg by mouth every 6 (six) hours as needed. 03/12/22  Yes [provider]  sertraline (ZOLOFT) 50 MG tablet TAKE HALF A TABLET (25MG) ONCE DAILY. Patient taking differently: Take 25 mg  by mouth daily. Take half a tablet (3m) once daily. 02/20/22  Yes SJacquelynn Cree MD  sevelamer carbonate (RENVELA) 800 MG tablet Take 1 tablet (800 mg total) by mouth 3 (three) times daily with meals. 12/22/21  Yes Elgergawy, DSilver Huguenin MD  vancomycin (VANCOCIN) 125 MG capsule TAKE 1 CAPSULE (125 MG TOTAL) BY MOUTH 4 (FOUR) TIMES DAILY FOR 14 DAYS, THEN 1 CAPSULE (125 MG TOTAL) 2 (TWO) TIMES DAILY FOR 7 DAYS, THEN 1 CAPSULE (125 MG TOTAL) DAILY FOR 7 DAYS, THEN 1 CAPSULE (125 MG TOTAL) EVERY OTHER DAY FOR 28 DAYS. 03/04/22 04/29/22 Yes CHarvel Quale MD  blood glucose meter kit and supplies Dispense based on patient and insurance preference. Use up to four times daily as directed. (FOR ICD-10 E10.9, E11.9). 01/25/22   DJohnette Abraham MD  furosemide (LASIX) 80 MG tablet Please take 80 mg of Lasix on none HD days on Sunday, Monday, Wednesday and Friday Patient not taking: Reported on 03/21/2022 12/22/21   Elgergawy, DSilver Huguenin MD  GVOKE HYPOPEN 1-PACK 1 MG/0.2ML SOAJ INJECT 1 MG INTO THE SKIN AS NEEDED (SEVERE HYPOGLYCEMIA). Patient not taking: Reported on 03/28/2022 01/25/22    DJohnette Abraham MD  oxyCODONE-acetaminophen (PERCOCET) 5-325 MG tablet Take 1 tablet by mouth every 6 (six) hours as needed for severe pain. Patient not taking: Reported on 03/21/2022 03/05/22   ERosetta Posner MD    Physical Exam: Vitals:   03/28/22 2115 03/28/22 2130 03/28/22 2200 03/28/22 2215  BP: (!) 115/53 (!) 110/54 120/61 (!) 108/52  Pulse: 80 76 80 78  Resp: _0 Temp:      TempSrc:      SpO2: 96% 97% 96% 95%    Constitutional: NAD, calm, comfortable Vitals:   03/28/22 2115 03/28/22 2130 03/28/22 2200 03/28/22 2215  BP: (!) 115/53 (!) 110/54 120/61 (!) 108/52  Pulse: 80 76 80 78  Resp: _1 Temp:      TempSrc:      SpO2: 96% 97% 96% 95%   Eyes: PERRL, lids and conjunctivae normal ENMT: Mucous membranes are moist.   Neck: normal, supple, no masses, no thyromegaly Respiratory: clear to auscultation bilaterally, no wheezing, no crackles. Normal respiratory effort. No accessory muscle use.  port left upper chest-reports it was for CIDP medication infusions in the past Cardiovascular: Regular rate and rhythm, no murmurs / rubs / gallops.  Pitting bilateral lower extremity edema worse on the right. Abdomen: Abdomen full, soft, no tenderness, PD  access sites clean  Musculoskeletal: no clubbing / cyanosis. No joint deformity upper and lower extremities.  Skin: Erythema worse on the right, mild differential warmth RLE , no rashes, lesions, ulcers. No induration Neurologic: No apparent cranial nerve abnormality, moving extremities spontaneously. Psychiatric: Normal judgment and insight. Alert and oriented x 3. Normal mood.     Labs on Admission: I have personally reviewed following labs and imaging studies  CBC: Recent Labs  Lab 03/28/22 1343  WBC 28.1*  HGB 10.5*  HCT 30.5*  MCV 82.4  PLT 3428  Basic Metabolic Panel: Recent Labs  Lab 03/28/22 1343 03/28/22 1543  NA 134*  --   K 2.6*  --   CL 102  --   CO2 12*  --   GLUCOSE 254*  --   BUN  53*  --   CREATININE 10.06*  --   CALCIUM 7.0*  --   MG  --  1.6*   CBG: Recent Labs  Lab 03/28/22 1311  GLUCAP 241*   Radiological Exams on Admission: CT CHEST ABDOMEN PELVIS WO CONTRAST  Result Date: 03/28/2022 CLINICAL DATA:  Sepsis. Neck pain after fall. Mid chest pain. Fall comparison EXAM: CT CHEST, ABDOMEN AND PELVIS WITHOUT CONTRAST TECHNIQUE: Multidetector CT imaging of the chest, abdomen and pelvis was performed following the standard protocol without IV contrast. RADIATION DOSE REDUCTION: This exam was performed according to the departmental dose-optimization program which includes automated exposure control, adjustment of the mA and/or kV according to patient size and/or use of iterative reconstruction technique. COMPARISON:  None Available. FINDINGS: CT CHEST FINDINGS CT CHEST FINDINGS Cardiovascular: No evidence of aortic injury on noncontrast exam. Coronary stent noted. No pericardial fluid Mediastinum/Nodes: No axillary or supraclavicular adenopathy. No mediastinal or hilar adenopathy. No pericardial fluid. Esophagus normal. Lungs/Pleura: No pulmonary contusion or pleural fluid. No pneumothorax. Airspace disease. There are bilateral lower lobe patchy ground-glass densities (image 128/series 4/RIGHT lower lobe) (image 121/series 4/LEFT lower lobe). Similar findings in the inferior RIGHT middle lobe. Musculoskeletal: No aggressive osseous lesion. CT ABDOMEN AND PELVIS FINDINGS Hepatobiliary: No focal hepatic lesion. No biliary ductal dilatation. Gallbladder is normal. Common bile duct is normal. Pancreas: Pancreas is normal. No ductal dilatation. No pancreatic inflammation. Spleen: Normal spleen Adrenals/urinary tract: Adrenal glands and kidneys are normal. Ureters and bladder normal. Dialysate catheter coiled the pelvis.  Small amount fluid the pelvis Stomach/Bowel: Stomach, small bowel, appendix, and cecum are normal. The colon and rectosigmoid colon are normal. Vascular/Lymphatic:  Abdominal aorta is normal caliber. There is no retroperitoneal or periportal lymphadenopathy. No pelvic lymphadenopathy. Reproductive: Uterus and adnexa unremarkable. Other: Small volume free fluid the pelvis Musculoskeletal: No aggressive osseous lesion. Lucency within the posterior iliac bones is favored late osteopenia degenerative change (image 99/3. Posterior lumbar fusion. IMPRESSION: Chest Impression: 1. No evidence of thoracic trauma. 2. Scattered ground-glass densities at the lung bases are favored atelectasis or edema. Infection felt less likely. Abdomen / Pelvis Impression: 1. No evidence of trauma in the abdomen pelvis. 2. Small free fluid related to peritoneal dialysis. Electronically Signed   By: Suzy Bouchard M.D.   On: 03/28/2022 17:05   CT Cervical Spine Wo Contrast  Result Date: 03/28/2022 CLINICAL DATA:  Trauma EXAM: CT CERVICAL SPINE WITHOUT CONTRAST TECHNIQUE: Multidetector CT imaging of the cervical spine was performed without intravenous contrast. Multiplanar CT image reconstructions were also generated. RADIATION DOSE REDUCTION: This exam was performed according to the departmental dose-optimization program which includes automated exposure control, adjustment of the mA and/or kV according to patient size and/or use of iterative reconstruction technique. COMPARISON:  None Available. FINDINGS: Alignment: Normal. Skull base and vertebrae: No acute fracture. No primary bone lesion or focal pathologic process. Severe facet degenerative change at C2-C3 left. Soft tissues and spinal canal: No prevertebral fluid or swelling. No visible canal hematoma. Disc levels:  No evidence of high-grade spinal canal stenosis. Upper chest: No focal opacity. Left-sided central venous catheter in place. Other: None. IMPRESSION: No acute fracture or traumatic malalignment of the cervical spine. Electronically Signed   By: Marin Roberts M.D.   On: 03/28/2022 16:47   US Venous Img Lower Right (DVT  Study)  Result Date: 03/28/2022 CLINICAL DATA:  Right lower extremity pain and swelling for 2 days. EXAM: RIGHT LOWER EXTREMITY VENOUS DOPPLER ULTRASOUND TECHNIQUE: Gray-scale sonography with compression, as well as color and duplex ultrasound, were performed to evaluate the deep venous system(s) from the level of the common femoral vein through the popliteal and proximal calf veins.  COMPARISON:  None Available. FINDINGS: VENOUS Normal compressibility of the common femoral, superficial femoral, and popliteal veins, as well as the visualized calf veins. Visualized portions of profunda femoral vein and great saphenous vein unremarkable. No filling defects to suggest DVT on grayscale or color Doppler imaging. Doppler waveforms show normal direction of venous flow, normal respiratory plasticity and response to augmentation. Limited views of the contralateral common femoral vein are unremarkable. OTHER Subcutaneous soft tissue edema about the calf. Limitations: none IMPRESSION: 1. No evidence of right lower extremity DVT. 2. Subcutaneous soft tissue edema about the calf. Electronically Signed   By: Keane Police D.O.   On: 03/28/2022 15:33   DG Foot Complete Right  Result Date: 03/28/2022 CLINICAL DATA:  Swelling and pain in right foot. Evaluate for osteomyelitis. EXAM: RIGHT FOOT COMPLETE - 3+ VIEW COMPARISON:  Right foot radiographs 08/29/2021 FINDINGS: Small plantar and minimal posterior calcaneal heel spurs, unchanged. Mild dorsal talonavicular degenerative osteophytes. No acute fracture or dislocation. No cortical erosion is seen. No subcutaneous air. Moderate vascular calcifications. Possible soft tissue wound at the posterior plantar aspect of the heel, similar to prior. IMPRESSION: Possible soft tissue wound at the posterior plantar aspect of the heel, similar to prior. No cortical erosion to indicate radiographic evidence of osteomyelitis. Electronically Signed   By: Yvonne Kendall M.D.   On: 03/28/2022  14:29   DG Chest Port 1 View  Result Date: 03/28/2022 CLINICAL DATA:  Shortness of breath. EXAM: PORTABLE CHEST 1 VIEW COMPARISON:  AP chest 12/18/2021 FINDINGS: Left chest wall porta catheter tip again overlies the central superior vena cava. Spinal cord stimulator electrodes again overlie the lower thoracic spine. Mild dextrocurvature of the mid thoracic spine. Cardiac silhouette and mediastinal contours are within limits. The lungs are clear. No pleural effusion or pneumothorax. IMPRESSION: No active disease. Electronically Signed   By: Yvonne Kendall M.D.   On: 03/28/2022 14:27    EKG: Independently reviewed.  Sinus rhythm, rate 88, QTc 508.  Assessment/Plan Principal Problem:   Cellulitis of lower extremity Active Problems:   Diabetic foot ulcer ([place])   ESRD on peritoneal dialysis [facility])   Essential hypertension   Uncontrolled type 2 diabetes mellitus with hyperglycemia, without long-term current use of insulin (HCC)   Increased anion gap metabolic acidosis   Diarrhea   Assessment and Plan: * Cellulitis of lower extremity Cellulitis to lower extremity, more so on the right than left lower extremity.  Significant leukocytosis of 28.1.  Right foot x-ray shows possible soft tissue wound at the posterior plantar aspect of heel.  No evidence of osteomyelitis. -IV ceftriaxone -Follow up blood cultures - RLE ultrasound negative for DVT  Diabetic foot ulcer (HCC) Bilateral chronic heel ulcers. -Wound care consult  ESRD on peritoneal dialysis [facility]) She has not missed any HD sessions.  She completes her 9 hours every night.  Today anion gap is elevated at 20, with serum bicarb of 12.  VBG shows pH of 7.18, pCO2 of 36.  He has only PD access.  No HD access.  Has been on dialysis for about 6 months. -Admit to [facility] for [facility] -1 amp sodium bicarb 50 mEq -Resume renal meds -I Spoke to Dr. Royce Macadamia, nephrology team will see in consult at [facility], No need to be aggressive  with bicarb/correcting acidosis.  Okay with just to 1 amp that has been given.  Repeat K and if less than 3 give 20 oral.  Diarrhea Diagnosed with C. difficile diarrhea and started  on oral vancomycin 3 weeks ago.  She is to complete a 56-day course.  Uncontrolled type 2 diabetes mellitus with hyperglycemia, without long-term current use of insulin (HCC) Not on medication. - SSI- S  Essential hypertension Blood pressure stable. -Resume metoprolol, Imdur,   DVT prophylaxis: Heparin Code Status: full Family Communication: none at bedside Disposition Plan: >2 days Consults called: nephrology Admission status: Inpatient, tele I certify that at the point of admission it is my clinical judgment that the patient will require inpatient hospital care spanning beyond 2 midnights from the point of admission due to high intensity of service, high risk for further deterioration and high frequency of surveillance required.   Author: Bethena Roys, MD 03/28/2022 10:42 PM  For on call review [email or web address].

## 2022-03-28 NOTE — Assessment & Plan Note (Addendum)
Cellulitis to lower extremity, more so on the right than left lower extremity.  Significant leukocytosis of 28.1.  Right foot x-ray shows possible soft tissue wound at the posterior plantar aspect of heel.  No evidence of osteomyelitis. -IV ceftriaxone -Follow up blood cultures - RLE ultrasound negative for DVT

## 2022-03-28 NOTE — Assessment & Plan Note (Addendum)
Bilateral chronic heel ulcers. -Wound care consult

## 2022-03-28 NOTE — Assessment & Plan Note (Signed)
Not on medication. - SSI- S

## 2022-03-29 DIAGNOSIS — L03119 Cellulitis of unspecified part of limb: Secondary | ICD-10-CM | POA: Diagnosis not present

## 2022-03-29 DIAGNOSIS — E876 Hypokalemia: Secondary | ICD-10-CM | POA: Diagnosis not present

## 2022-03-29 DIAGNOSIS — N186 End stage renal disease: Secondary | ICD-10-CM | POA: Diagnosis not present

## 2022-03-29 DIAGNOSIS — N2581 Secondary hyperparathyroidism of renal origin: Secondary | ICD-10-CM | POA: Diagnosis not present

## 2022-03-29 DIAGNOSIS — Z992 Dependence on renal dialysis: Secondary | ICD-10-CM | POA: Diagnosis not present

## 2022-03-29 DIAGNOSIS — D631 Anemia in chronic kidney disease: Secondary | ICD-10-CM | POA: Diagnosis not present

## 2022-03-29 DIAGNOSIS — I120 Hypertensive chronic kidney disease with stage 5 chronic kidney disease or end stage renal disease: Secondary | ICD-10-CM | POA: Diagnosis not present

## 2022-03-29 LAB — CBC
HCT: 25.3 % — ABNORMAL LOW (ref 36.0–46.0)
Hemoglobin: 8.7 g/dL — ABNORMAL LOW (ref 12.0–15.0)
MCH: 28.5 pg (ref 26.0–34.0)
MCHC: 34.4 g/dL (ref 30.0–36.0)
MCV: 83 fL (ref 80.0–100.0)
Platelets: 250 10*3/uL (ref 150–400)
RBC: 3.05 MIL/uL — ABNORMAL LOW (ref 3.87–5.11)
RDW: 16.7 % — ABNORMAL HIGH (ref 11.5–15.5)
WBC: 19.9 10*3/uL — ABNORMAL HIGH (ref 4.0–10.5)
nRBC: 0 % (ref 0.0–0.2)

## 2022-03-29 LAB — CBG MONITORING, ED
Glucose-Capillary: 130 mg/dL — ABNORMAL HIGH (ref 70–99)
Glucose-Capillary: 224 mg/dL — ABNORMAL HIGH (ref 70–99)
Glucose-Capillary: 87 mg/dL (ref 70–99)

## 2022-03-29 LAB — BASIC METABOLIC PANEL
Anion gap: 14 (ref 5–15)
BUN: 60 mg/dL — ABNORMAL HIGH (ref 6–20)
CO2: 13 mmol/L — ABNORMAL LOW (ref 22–32)
Calcium: 6.9 mg/dL — ABNORMAL LOW (ref 8.9–10.3)
Chloride: 105 mmol/L (ref 98–111)
Creatinine, Ser: 10.32 mg/dL — ABNORMAL HIGH (ref 0.44–1.00)
GFR, Estimated: 4 mL/min — ABNORMAL LOW (ref >60)
Glucose, Bld: 89 mg/dL (ref 70–99)
Potassium: 3 mmol/L — ABNORMAL LOW (ref 3.5–5.1)
Sodium: 132 mmol/L — ABNORMAL LOW (ref 135–145)

## 2022-03-29 MED ORDER — DEXTROSE 5 % IV SOLN
1500.0000 mg | Freq: Once | INTRAVENOUS | Status: AC
  Administered 2022-03-29: 1500 mg via INTRAVENOUS
  Filled 2022-03-29: qty 75

## 2022-03-29 MED ORDER — SODIUM CHLORIDE 0.9 % IV SOLN: 1.0000 g | INTRAVENOUS | Status: DC

## 2022-03-29 MED ORDER — MELATONIN 3 MG PO TABS
9.0000 mg | ORAL_TABLET | Freq: Every day | ORAL | Status: DC
  Administered 2022-03-29: 9 mg via ORAL
  Filled 2022-03-29: qty 3

## 2022-03-29 MED ORDER — HEPARIN SOD (PORK) LOCK FLUSH 100 UNIT/ML IV SOLN
INTRAVENOUS | Status: AC
  Filled 2022-03-29: qty 5

## 2022-03-29 MED ORDER — VANCOMYCIN HCL 125 MG PO CAPS: 125.0000 mg | ORAL_CAPSULE | ORAL | Status: DC

## 2022-03-29 MED ORDER — SODIUM BICARBONATE 650 MG PO TABS
1300.0000 mg | ORAL_TABLET | Freq: Three times a day (TID) | ORAL | Status: DC
  Administered 2022-03-29: 1300 mg via ORAL
  Filled 2022-03-29: qty 2

## 2022-03-29 MED ORDER — DELFLEX-LC/1.5% DEXTROSE 344 MOSM/L IP SOLN: INTRAPERITONEAL | Status: DC

## 2022-03-29 MED ORDER — CALCIUM CARBONATE ANTACID 500 MG PO CHEW
1.0000 | CHEWABLE_TABLET | Freq: Every day | ORAL | Status: DC
  Administered 2022-03-29: 200 mg via ORAL
  Filled 2022-03-29: qty 1

## 2022-03-29 MED ORDER — OXYCODONE-ACETAMINOPHEN 5-325 MG PO TABS: 1.0000 | ORAL_TABLET | Freq: Four times a day (QID) | ORAL | 0 refills | Status: AC | PRN

## 2022-03-29 MED ORDER — SODIUM BICARBONATE 650 MG PO TABS: 1300.0000 mg | ORAL_TABLET | Freq: Three times a day (TID) | ORAL | 0 refills | Status: AC

## 2022-03-29 MED ORDER — METHOCARBAMOL 500 MG PO TABS
500.0000 mg | ORAL_TABLET | Freq: Once | ORAL | Status: AC
  Administered 2022-03-29: 500 mg via ORAL
  Filled 2022-03-29: qty 1

## 2022-03-29 MED ORDER — CALCIUM CARBONATE ANTACID 500 MG PO CHEW: 1.0000 | CHEWABLE_TABLET | Freq: Every day | ORAL | 0 refills | Status: AC

## 2022-03-29 MED ORDER — MEDIHONEY WOUND/BURN DRESSING EX PSTE
1.0000 | PASTE | Freq: Every day | CUTANEOUS | Status: DC
  Administered 2022-03-29: 1 via TOPICAL
  Filled 2022-03-29: qty 44

## 2022-03-29 MED ORDER — GENTAMICIN SULFATE 0.1 % EX CREA: 1.0000 | TOPICAL_CREAM | Freq: Every day | CUTANEOUS | Status: DC

## 2022-03-29 MED ORDER — VANCOMYCIN HCL 125 MG PO CAPS
125.0000 mg | ORAL_CAPSULE | Freq: Every day | ORAL | Status: DC
  Administered 2022-03-29: 125 mg via ORAL
  Filled 2022-03-29 (×4): qty 1

## 2022-03-29 NOTE — Consult Note (Signed)
WOC Nurse Consult Note: Reason for Consult:Bilateral heel wounds, chronic, nonhealing, L>R. Stage 3.  Has had Keracis grafts placed in the past (09/2021) by Dr Sharol Given. Last photo uploaded to EMR is dated 10/30/21. Wound type:Neuropathic, pressure Pressure Injury POA: N/A Measurement:To be measured by Bedside RN and documented on Nursing Flow Sheet with next dressing change Drainage (amount, consistency, odor) None Periwound:dry Dressing procedure/placement/frequency:I will provide Nursing with guidance for the daily care of the bilateral chronic, neuropathic and pressure injuries to include cleansing daily with NS, patting dry and dressing with MediHoney (antimicrobial gel) to facilitate autolysis and promote tissue regeneration and wound repair. Medihoney is to be topped with dry gauze and dressing secured with silicone foam. Heels are to be floated/offloaded while in bed with Prevalon boots.  Recommend that patient return to Dr. Sharol Given for follow up post discharge for continued follow up and oversight of the POC. If you agree, please include in discharge instructions.  [place] nursing team will not follow, but will remain available to this patient, the nursing and medical teams.  Please re-consult if needed.  Thank you for inviting [place] to participate in this patient's Plan of Care.  Maudie Flakes, MSN, RN, CNS, [place], Serita Grammes, [employer], [employer] phone:  [phone]

## 2022-03-29 NOTE — ED Notes (Signed)
Lab at bedside

## 2022-03-29 NOTE — Telephone Encounter (Signed)
Called patient to follow up to make sure she rest of vanomycin from pharmacy and she told me she was currently admitted to hospital. Will follow up with patient she is discharged.

## 2022-03-29 NOTE — ED Notes (Signed)
Pt understands that they are on a fluid restricted diet

## 2022-03-29 NOTE — ED Notes (Addendum)
Pt taken to the bathroom via wheelchair by this RN and NT Pt noticed a small skin tear near elbow while in bathroom. Pt's elbow wrapped with guaze and koban. Pt notified this RN that she usually has C-Diff off and on and needs a fecal transplant

## 2022-03-29 NOTE — Discharge Summary (Signed)
Physician Discharge Summary  Tracy Walsh EUM:[id number] DOB: [birth year] DOA: 03/28/2022  PCP: [name], MD  Admit date: 03/28/2022  Discharge date: 03/29/2022  Admitted From:Home  Disposition:  Home  Recommendations for Outpatient Follow-up:  Follow up with PCP in 1-2 weeks Follow-up with infectious disease which will be scheduled and referral has been sent for cellulitis follow-up after dalbavancin given in ED Leukocytosis drastically improved and no growth on blood cultures noted Continue peritoneal dialysis per home regimen, added sodium bicarbonate for metabolic acidosis as well as Tums for hypocalcemia. Oxycodone/acetaminophen given for pain management with 8 tablets and 0 refills as requested Continue oral vancomycin for C. difficile management  Home Health: None  Equipment/Devices: None  Discharge Condition:Stable  CODE STATUS: Full  Diet recommendation: Renal/carb modified diet  Brief/Interim Summary: Tracy Walsh is a 56 y.o. female with medical history significant for ESRD on peritoneal dialysis, COPD with chronic respiratory failure, hypertension, depression, diabetes mellitus, systolic and diastolic CHF, CIPD.  Patient presented to the ED today with complaints of bilateral lower extremity swelling worse on the right over the past 3 days associated severe pain, redness more on the right.  She was admitted with bilateral lower extremity cellulitis and started on IV Rocephin.  She was noted to have significant leukocytosis but no signs of sepsis and leukocytosis has improved during the course of the stay.  She overall appears to be in stable condition and continues to complain of some lower extremity pain, but other than this is doing well enough and was curious if she could discharge home.  She was seen by nephrology due to concerns for need for peritoneal dialysis while inpatient and she was pending transfer to [facility], but it appears that she may receive  dalbavancin while here and be discharged appropriately back home with close follow-up to ID outpatient.  No other acute events noted throughout the course of this admission and she is stable for discharge.  Discharge Diagnoses:  Principal Problem:   Cellulitis of lower extremity Active Problems:   Diabetic foot ulcer ([place])   ESRD on peritoneal dialysis [facility])   Essential hypertension   Uncontrolled type 2 diabetes mellitus with hyperglycemia, without long-term current use of insulin (HCC)   Increased anion gap metabolic acidosis   Diarrhea  Principal discharge diagnosis: Bilateral lower extremity cellulitis status post administration of dalbavancin.  ESRD on PD with some metabolic acidosis.  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  Discharge Instructions  Discharge Instructions     Ambulatory referral to Infectious Disease   Complete by: As directed    Cellulitis patient:  Received dalbavancin on 03/29/2022.   Diet - low sodium heart healthy   Complete by: As directed    If the dressing is still on your incision site when you go home, remove it on the third day after your surgery date. Remove dressing if it begins to fall off, or if it is dirty or damaged before the third day.   Complete by: As directed    Increase activity slowly   Complete by: As directed       Allergies as of 03/29/2022       Reactions   Amoxicillin Anaphylaxis   Clarithromycin Anaphylaxis, Diarrhea, Itching, Nausea And Vomiting, Rash   Codeine Itching   Hydrocodone Itching   Moxifloxacin Anaphylaxis, Anxiety, Hives, Itching, Nausea And Vomiting, Palpitations, Shortness Of Breath   Penicillins Anaphylaxis   Sulfa Antibiotics Anaphylaxis   Nitrofurantoin Nausea And Vomiting   Crab (diagnostic)  Hives   Latex Hives, Itching, Rash, Swelling        Medication List     TAKE these medications    albuterol (2.5 MG/3ML) 0.083% nebulizer solution Commonly known as:  PROVENTIL Take 3 mLs (2.5 mg total) by nebulization every 6 (six) hours as needed for wheezing or shortness of breath.   blood glucose meter kit and supplies Dispense based on patient and insurance preference. Use up to four times daily as directed. (FOR ICD-10 E10.9, E11.9).   calcium carbonate 500 MG chewable tablet Commonly known as: TUMS - dosed in mg elemental calcium Chew 1 tablet (200 mg of elemental calcium total) by mouth daily. Start taking on: March 30, 2022   cyanocobalamin 1000 MCG/ML injection Commonly known as: VITAMIN B12 Inject 1,000 mcg into the muscle every 30 (thirty) days.   dicyclomine 10 MG capsule Commonly known as: BENTYL TAKE 1 CAPSULE (10 MG TOTAL) BY MOUTH 2 (TWO) TIMES DAILY AS NEEDED FOR SPASMS.   ferrous sulfate 325 (65 FE) MG EC tablet Take 325 mg by mouth daily with breakfast.   furosemide 80 MG tablet Commonly known as: LASIX Please take 80 mg of Lasix on none HD days on Sunday, Monday, Wednesday and Friday   gabapentin 100 MG capsule Commonly known as: NEURONTIN Take 2 capsules (200 mg total) by mouth at bedtime. What changed: when to take this   Gvoke HypoPen 1-Pack 1 MG/0.2ML Soaj Generic drug: Glucagon INJECT 1 MG INTO THE SKIN AS NEEDED (SEVERE HYPOGLYCEMIA).   isosorbide mononitrate 60 MG 24 hr tablet Commonly known as: IMDUR Take 1 tablet (60 mg total) by mouth daily. What changed: how much to take   loperamide 2 MG capsule Commonly known as: IMODIUM Take 2 mg by mouth as needed for diarrhea or loose stools.   Melatonin 10 MG Caps Take 1 capsule by mouth daily.   metoprolol succinate 50 MG 24 hr tablet Commonly known as: TOPROL-XL TAKE 1 TABLET BY MOUTH EVERY DAY   mupirocin ointment 2 % Commonly known as: BACTROBAN Apply 1 Application topically daily.   omeprazole 20 MG capsule Commonly known as: PRILOSEC Take 20 mg by mouth daily.   ondansetron 4 MG tablet Commonly known as: Zofran Take 1 tablet (4 mg total) by  mouth daily as needed for nausea or vomiting.   oxyCODONE-acetaminophen 5-325 MG tablet Commonly known as: Percocet Take 1 tablet by mouth every 6 (six) hours as needed for severe pain.   potassium chloride SA 20 MEQ tablet Commonly known as: KLOR-CON M Take 20 mEq by mouth 2 (two) times daily.   PROBIOTIC GUMMIES PO Take 1 tablet by mouth every evening.   promethazine 12.5 MG tablet Commonly known as: PHENERGAN Take 12.5 mg by mouth every 6 (six) hours as needed.   sertraline 50 MG tablet Commonly known as: ZOLOFT TAKE HALF A TABLET (25MG) ONCE DAILY. What changed: See the new instructions.   sevelamer carbonate 800 MG tablet Commonly known as: RENVELA Take 1 tablet (800 mg total) by mouth 3 (three) times daily with meals.   sodium bicarbonate 650 MG tablet Take 2 tablets (1,300 mg total) by mouth 3 (three) times daily.   vancomycin 125 MG capsule Commonly known as: VANCOCIN TAKE 1 CAPSULE (125 MG TOTAL) BY MOUTH 4 (FOUR) TIMES DAILY FOR 14 DAYS, THEN 1 CAPSULE (125 MG TOTAL) 2 (TWO) TIMES DAILY FOR 7 DAYS, THEN 1 CAPSULE (125 MG TOTAL) DAILY FOR 7 DAYS, THEN 1 CAPSULE (125 MG TOTAL) EVERY OTHER  DAY FOR 28 DAYS. Start taking on: March 04, 2022               Discharge Care Instructions  (From admission, onward)           Start     Ordered   03/29/22 0000  If the dressing is still on your incision site when you go home, remove it on the third day after your surgery date. Remove dressing if it begins to fall off, or if it is dirty or damaged before the third day.        03/29/22 1042            Follow-up Information     [name], MD. Schedule an appointment as soon as possible for a visit in 1 week(s).   Specialty: Internal Medicine Contact information: [address] [place] [address] [phone]         [facility]             . Go to.   Contact information: [place] [phone]               Allergies  Allergen Reactions   Amoxicillin Anaphylaxis   Clarithromycin Anaphylaxis, Diarrhea, Itching, Nausea And Vomiting and Rash   Codeine Itching   Hydrocodone Itching   Moxifloxacin Anaphylaxis, Anxiety, Hives, Itching, Nausea And Vomiting, Palpitations and Shortness Of Breath   Penicillins Anaphylaxis   Sulfa Antibiotics Anaphylaxis   Nitrofurantoin Nausea And Vomiting   Crab (Diagnostic) Hives   Latex Hives, Itching, Rash and Swelling    Consultations: Nephrology   Procedures/Studies: CT CHEST ABDOMEN PELVIS WO CONTRAST  Result Date: 03/28/2022 CLINICAL DATA:  Sepsis. Neck pain after fall. Mid chest pain. Fall comparison EXAM: CT CHEST, ABDOMEN AND PELVIS WITHOUT CONTRAST TECHNIQUE: Multidetector CT imaging of the chest, abdomen and pelvis was performed following the standard protocol without IV contrast. RADIATION DOSE REDUCTION: This exam was performed according to the departmental dose-optimization program which includes automated exposure control, adjustment of the mA and/or kV according to patient size and/or use of iterative reconstruction technique. COMPARISON:  None Available. FINDINGS: CT CHEST FINDINGS CT CHEST FINDINGS Cardiovascular: No evidence of aortic injury on noncontrast exam. Coronary stent noted. No pericardial fluid Mediastinum/Nodes: No axillary or supraclavicular adenopathy. No mediastinal or hilar adenopathy. No pericardial fluid. Esophagus normal. Lungs/Pleura: No pulmonary contusion or pleural fluid. No pneumothorax. Airspace disease. There are bilateral lower lobe patchy ground-glass densities (image 128/series 4/RIGHT lower lobe) (image 121/series 4/LEFT lower lobe). Similar findings in the inferior RIGHT middle lobe. Musculoskeletal: No aggressive osseous lesion. CT ABDOMEN AND PELVIS FINDINGS Hepatobiliary: No focal hepatic lesion. No biliary ductal dilatation. Gallbladder is normal. Common bile duct is  normal. Pancreas: Pancreas is normal. No ductal dilatation. No pancreatic inflammation. Spleen: Normal spleen Adrenals/urinary tract: Adrenal glands and kidneys are normal. Ureters and bladder normal. Dialysate catheter coiled the pelvis.  Small amount fluid the pelvis Stomach/Bowel: Stomach, small bowel, appendix, and cecum are normal. The colon and rectosigmoid colon are normal. Vascular/Lymphatic: Abdominal aorta is normal caliber. There is no retroperitoneal or periportal lymphadenopathy. No pelvic lymphadenopathy. Reproductive: Uterus and adnexa unremarkable. Other: Small volume free fluid the pelvis Musculoskeletal: No aggressive osseous lesion. Lucency within the posterior iliac bones is favored late osteopenia degenerative change (image 99/3. Posterior lumbar fusion. IMPRESSION: Chest Impression: 1. No evidence of thoracic trauma. 2. Scattered ground-glass densities at the lung bases are  favored atelectasis or edema. Infection felt less likely. Abdomen / Pelvis Impression: 1. No evidence of trauma in the abdomen pelvis. 2. Small free fluid related to peritoneal dialysis. Electronically Signed   By: Suzy Bouchard M.D.   On: 03/28/2022 17:05   CT Cervical Spine Wo Contrast  Result Date: 03/28/2022 CLINICAL DATA:  Trauma EXAM: CT CERVICAL SPINE WITHOUT CONTRAST TECHNIQUE: Multidetector CT imaging of the cervical spine was performed without intravenous contrast. Multiplanar CT image reconstructions were also generated. RADIATION DOSE REDUCTION: This exam was performed according to the departmental dose-optimization program which includes automated exposure control, adjustment of the mA and/or kV according to patient size and/or use of iterative reconstruction technique. COMPARISON:  None Available. FINDINGS: Alignment: Normal. Skull base and vertebrae: No acute fracture. No primary bone lesion or focal pathologic process. Severe facet degenerative change at C2-C3 left. Soft tissues and spinal canal: No  prevertebral fluid or swelling. No visible canal hematoma. Disc levels:  No evidence of high-grade spinal canal stenosis. Upper chest: No focal opacity. Left-sided central venous catheter in place. Other: None. IMPRESSION: No acute fracture or traumatic malalignment of the cervical spine. Electronically Signed   By: Marin Roberts M.D.   On: 03/28/2022 16:47   US Venous Img Lower Right (DVT Study)  Result Date: 03/28/2022 CLINICAL DATA:  Right lower extremity pain and swelling for 2 days. EXAM: RIGHT LOWER EXTREMITY VENOUS DOPPLER ULTRASOUND TECHNIQUE: Gray-scale sonography with compression, as well as color and duplex ultrasound, were performed to evaluate the deep venous system(s) from the level of the common femoral vein through the popliteal and proximal calf veins. COMPARISON:  None Available. FINDINGS: VENOUS Normal compressibility of the common femoral, superficial femoral, and popliteal veins, as well as the visualized calf veins. Visualized portions of profunda femoral vein and great saphenous vein unremarkable. No filling defects to suggest DVT on grayscale or color Doppler imaging. Doppler waveforms show normal direction of venous flow, normal respiratory plasticity and response to augmentation. Limited views of the contralateral common femoral vein are unremarkable. OTHER Subcutaneous soft tissue edema about the calf. Limitations: none IMPRESSION: 1. No evidence of right lower extremity DVT. 2. Subcutaneous soft tissue edema about the calf. Electronically Signed   By: Keane Police D.O.   On: 03/28/2022 15:33   DG Foot Complete Right  Result Date: 03/28/2022 CLINICAL DATA:  Swelling and pain in right foot. Evaluate for osteomyelitis. EXAM: RIGHT FOOT COMPLETE - 3+ VIEW COMPARISON:  Right foot radiographs 08/29/2021 FINDINGS: Small plantar and minimal posterior calcaneal heel spurs, unchanged. Mild dorsal talonavicular degenerative osteophytes. No acute fracture or dislocation. No cortical erosion  is seen. No subcutaneous air. Moderate vascular calcifications. Possible soft tissue wound at the posterior plantar aspect of the heel, similar to prior. IMPRESSION: Possible soft tissue wound at the posterior plantar aspect of the heel, similar to prior. No cortical erosion to indicate radiographic evidence of osteomyelitis. Electronically Signed   By: Yvonne Kendall M.D.   On: 03/28/2022 14:29   DG Chest Port 1 View  Result Date: 03/28/2022 CLINICAL DATA:  Shortness of breath. EXAM: PORTABLE CHEST 1 VIEW COMPARISON:  AP chest 12/18/2021 FINDINGS: Left chest wall porta catheter tip again overlies the central superior vena cava. Spinal cord stimulator electrodes again overlie the lower thoracic spine. Mild dextrocurvature of the mid thoracic spine. Cardiac silhouette and mediastinal contours are within limits. The lungs are clear. No pleural effusion or pneumothorax. IMPRESSION: No active disease. Electronically Signed   By: Viann Fish.D.  On: 03/28/2022 14:27   CT ABDOMEN PELVIS WO CONTRAST  Result Date: 03/21/2022 CLINICAL DATA:  Peritonitis perforation suspected, vomiting, weakness, currently undergoing treatment for C diff EXAM: CT ABDOMEN AND PELVIS WITHOUT CONTRAST TECHNIQUE: Multidetector CT imaging of the abdomen and pelvis was performed following the standard protocol without IV contrast. RADIATION DOSE REDUCTION: This exam was performed according to the departmental dose-optimization program which includes automated exposure control, adjustment of the mA and/or kV according to patient size and/or use of iterative reconstruction technique. COMPARISON:  08/29/2021 FINDINGS: Lower chest: Bronchial wall thickening in the bilateral lung bases with extensively scattered centrilobular ground-glass nodularity (series 4, image 9). Hepatobiliary: No focal liver abnormality is seen. Hepatomegaly, maximum coronal span 22.6 cm. Status post cholecystectomy. No biliary dilatation. Pancreas: Unremarkable. No  pancreatic ductal dilatation or surrounding inflammatory changes. Spleen: Normal in size without significant abnormality. Benign granulomatous calcifications of the splenic parenchyma. Adrenals/Urinary Tract: Adrenal glands are unremarkable. Kidneys are normal, without renal calculi, solid lesion, or hydronephrosis. Bladder is unremarkable. Stomach/Bowel: Stomach is within normal limits. Appendix appears normal. No evidence of bowel wall thickening, distention, or inflammatory changes. Vascular/Lymphatic: Aortic atherosclerosis. No enlarged abdominal or pelvic lymph nodes. Reproductive: Status post hysterectomy. Other: No abdominal wall hernia or abnormality. Tenckhoff type peritoneal dialysis catheter in the low pelvis. Minimal dialysate in the pelvis (series 2, image 76) Musculoskeletal: No acute or significant osseous findings. IMPRESSION: 1. No acute noncontrast CT findings of the abdomen or pelvis to explain abdominal pain. Specifically, no imaging evidence of colitis or perforation. 2. Tenckhoff type peritoneal dialysis catheter in the low pelvis. Minimal dialysate in the pelvis. 3. Hepatomegaly. 4. Bronchial wall thickening in the bilateral lung bases with extensively scattered centrilobular ground-glass nodularity, consistent with nonspecific infection or inflammation. Aortic Atherosclerosis (ICD10-I70.0). Electronically Signed   By: Delanna Ahmadi M.D.   On: 03/21/2022 18:47     Discharge Exam: Vitals:   03/29/22 0800 03/29/22 0830  BP: 100/65 (!) 105/57  Pulse:    Resp: 10 16  Temp:    SpO2:     Vitals:   03/29/22 0530 03/29/22 0630 03/29/22 0800 03/29/22 0830  BP: (!) 108/56 102/87 100/65 (!) 105/57  Pulse: 75 80    Resp: _0 Temp:      TempSrc:      SpO2: 98% 99%      General: Pt is alert, awake, not in acute distress Cardiovascular: RRR, S1/S2 +, no rubs, no gallops Respiratory: CTA bilaterally, no wheezing, no rhonchi Abdominal: Soft, NT, ND, bowel sounds  + Extremities: no edema, no cyanosis    The results of significant diagnostics from this hospitalization (including imaging, microbiology, ancillary and laboratory) are listed below for reference.     Microbiology: Recent Results (from the past 240 hour(s))  Blood culture (routine x 2)     Status: None (Preliminary result)   Collection Time: 03/28/22  3:10 PM   Specimen: Porta Cath; Blood  Result Value Ref Range Status   Specimen Description PORTA CATH  Final   Special Requests   Final    Normal BOTTLES DRAWN AEROBIC AND ANAEROBIC Blood Culture adequate volume PORTA CATH   Culture   Final    NO GROWTH < 24 HOURS Performed at [facility], [address] [place], [place] [address]    Report Status PENDING  Incomplete  Blood culture (routine x 2)     Status: None (Preliminary result)   Collection Time: 03/28/22  3:18 PM   Specimen: Site  Not Specified; Blood  Result Value Ref Range Status   Specimen Description SITE NOT SPECIFIED  Final   Special Requests   Final    BOTTLES DRAWN AEROBIC AND ANAEROBIC Blood Culture adequate volume   Culture   Final    NO GROWTH < 24 HOURS Performed at [facility], [address] [place], [place] [address]    Report Status PENDING  Incomplete     Labs: BNP (last 3 results) Recent Labs    03/28/22 1337  BNP 644.0*   Basic Metabolic Panel: Recent Labs  Lab 03/28/22 1343 03/28/22 1543 03/29/22 0316  NA 134*  --  132*  K 2.6*  --  3.0*  CL 102  --  105  CO2 12*  --  13*  GLUCOSE 254*  --  89  BUN 53*  --  60*  CREATININE 10.06*  --  10.32*  CALCIUM 7.0*  --  6.9*  MG  --  1.6*  --    Liver Function Tests: No results for input(s): "AST", "ALT", "ALKPHOS", "BILITOT", "PROT", "ALBUMIN" in the last 168 hours. No results for input(s): "LIPASE", "AMYLASE" in the last 168 hours. No results for input(s): "AMMONIA" in the last 168 hours. CBC: Recent Labs  Lab 03/28/22 1343 03/29/22 0316  WBC 28.1* 19.9*  HGB 10.5* 8.7*  HCT 30.5*  25.3*  MCV 82.4 83.0  PLT 304 250   Cardiac Enzymes: No results for input(s): "CKTOTAL", "CKMB", "CKMBINDEX", "TROPONINI" in the last 168 hours. BNP: Invalid input(s): "POCBNP" CBG: Recent Labs  Lab 03/28/22 1311 03/29/22 0032 03/29/22 0806  GLUCAP 241* 130* 87   D-Dimer No results for input(s): "DDIMER" in the last 72 hours. Hgb A1c No results for input(s): "HGBA1C" in the last 72 hours. Lipid Profile No results for input(s): "CHOL", "HDL", "LDLCALC", "TRIG", "CHOLHDL", "LDLDIRECT" in the last 72 hours. Thyroid function studies No results for input(s): "TSH", "T4TOTAL", "T3FREE", "THYROIDAB" in the last 72 hours.  Invalid input(s): "FREET3" Anemia work up No results for input(s): "VITAMINB12", "FOLATE", "FERRITIN", "TIBC", "IRON", "RETICCTPCT" in the last 72 hours. Urinalysis    Component Value Date/Time   COLORURINE YELLOW 12/18/2021 0700   APPEARANCEUR HAZY (A) 12/18/2021 0700   LABSPEC 1.013 12/18/2021 0700   PHURINE 5.0 12/18/2021 0700   GLUCOSEU NEGATIVE 12/18/2021 0700   HGBUR NEGATIVE 12/18/2021 0700   BILIRUBINUR NEGATIVE 12/18/2021 0700   KETONESUR NEGATIVE 12/18/2021 0700   PROTEINUR 100 (A) 12/18/2021 0700   NITRITE NEGATIVE 12/18/2021 0700   LEUKOCYTESUR SMALL (A) 12/18/2021 0700   Sepsis Labs Recent Labs  Lab 03/28/22 1343 03/29/22 0316  WBC 28.1* 19.9*   Microbiology Recent Results (from the past 240 hour(s))  Blood culture (routine x 2)     Status: None (Preliminary result)   Collection Time: 03/28/22  3:10 PM   Specimen: Porta Cath; Blood  Result Value Ref Range Status   Specimen Description PORTA CATH  Final   Special Requests   Final    Normal BOTTLES DRAWN AEROBIC AND ANAEROBIC Blood Culture adequate volume PORTA CATH   Culture   Final    NO GROWTH < 24 HOURS Performed at [facility], [address], [place], [place] [address]    Report Status PENDING  Incomplete  Blood culture (routine x 2)     Status: None (Preliminary result)    Collection Time: 03/28/22  3:18 PM   Specimen: Site Not Specified; Blood  Result Value Ref Range Status   Specimen Description SITE NOT SPECIFIED  Final   Special Requests   Final    BOTTLES DRAWN AEROBIC AND ANAEROBIC Blood Culture adequate volume   Culture   Final    NO GROWTH < 24 HOURS Performed at [facility], [address], [place], [place] [address]    Report Status PENDING  Incomplete     Time coordinating discharge: 35 minutes  SIGNED:   Rodena Goldmann, DO [facility] 03/29/2022, 10:59 AM  If 7PM-7AM, please contact night-coverage [email or web address]

## 2022-03-29 NOTE — ED Notes (Signed)
Pt was given breakfast tray 

## 2022-03-29 NOTE — ED Notes (Signed)
Husband has arrived and asked to talk with the doctor- message sent to Dr Doristine Bosworth. Husband is concerned pt has not gotten her PD treatment. Dr Doristine Bosworth aware

## 2022-03-29 NOTE — ED Notes (Addendum)
Bilateral heel ulcers cleaned with NS, dried, medihoney applied, dry guaze, and silicone foam heel pad applied per wound nurse Pt placed in prevalon boots

## 2022-03-29 NOTE — Consult Note (Addendum)
ESRD Consult Note  Assessment/Recommendations:   ESRD on PD:  -Outpatient orders: clinic-[facility].  CCP 1.  9 hours for volume 2500 cc.  For changes.  1.5% bags lately.  This may have some fibrin plaque.  To be started as an outpatient.  Meds: Mircera 75 mcg q. monthly (due for next dose next week), Venofer 200 mg q. monthly -Pending to transfer to [facility] (PD not available here), will tentatively order PD--all 1.5% bags -if having fibrin in effluent, then will need to proceed with adding heparin  Cellulitis of b/l LE -per primary service  Metabolic acidosis -Likely a consequence of diarrhea and ESRD -will start nahco3 PO  Volume/ hypertension: -will UF as tolerated, utilizing all 1.5% bags for now  Anemia of Chronic Kidney Disease: Hemoglobin 8.7. transfuse prn for hgb <7, will check Fe panel  Secondary Hyperparathyroidism/Hyperphosphatemia: resume home binders   Diarrhea, Cdiff -on PO vanc, per primary -pending fecal transplant next year  Hypokalemia -exacerbated by diarrhea -caution with repletion given ESRD  Recommendations were discussed with the primary team.  Gean Quint, MD [facility]   History of Present Illness: Tracy Walsh is a/an 56 y.o. female with a past medical history of ESRD on PD (follows with [facility]), COPD, hypertension, DM 2, cdiff, combined CHF, CIPD, depression who presents with bilateral lower extremity swelling with severe pain and redness.  Has chronic ulcers of bilateral heels.  Found to have cellulitis, currently being treated with Rocephin.  Right lower extremity ultrasound was negative for DVT.  She was found to have metabolic acidosis, received 1 amp of sodium bicarb while here.  She does have C. difficile as well and has been having diarrhea. Patient seen and examined in ER. Husband at bedside. Pending transfer to [facility] currently. Did have fibrin in PD tubing and was due to start heparin with PD but this has not happened yet.  Otherwise PD has been going well. Does endorse pain in her legs otherwise no other complaints. Husband is wondering if they can be discharged and just do abx at home given that she has a port.   Medications:  Current Facility-Administered Medications  Medication Dose Route Frequency Provider Last Rate Last Admin   acetaminophen (TYLENOL) tablet 650 mg  650 mg Oral Q6H PRN Emokpae, Ejiroghene E, MD       Or   acetaminophen (TYLENOL) suppository 650 mg  650 mg Rectal Q6H PRN Emokpae, Ejiroghene E, MD       calcium carbonate (TUMS - dosed in mg elemental calcium) chewable tablet 200 mg of elemental calcium  1 tablet Oral Daily Manuella Ghazi, Pratik D, DO   200 mg of elemental calcium at 03/29/22 0833   cefTRIAXone (ROCEPHIN) 1 g in sodium chloride 0.9 % 100 mL IVPB  1 g Intravenous Q24H Manuella Ghazi, Pratik D, DO       dicyclomine (BENTYL) capsule 10 mg  10 mg Oral BID PRN Emokpae, Ejiroghene E, MD       heparin injection 5,000 Units  5,000 Units Subcutaneous Q8H Emokpae, Ejiroghene E, MD   5,000 Units at 03/29/22 0639   HYDROmorphone (DILAUDID) injection 0.5 mg  0.5 mg Intravenous Q4H PRN Emokpae, Ejiroghene E, MD   0.5 mg at 03/29/22 0424   insulin aspart (novoLOG) injection 0-5 Units  0-5 Units Subcutaneous QHS Emokpae, Ejiroghene E, MD       insulin aspart (novoLOG) injection 0-9 Units  0-9 Units Subcutaneous TID WC Emokpae, Ejiroghene E, MD  isosorbide mononitrate (IMDUR) 24 hr tablet 30 mg  30 mg Oral Daily Emokpae, Ejiroghene E, MD       leptospermum manuka honey (MEDIHONEY) paste 1 Application  1 Application Topical Daily Emokpae, Ejiroghene E, MD       melatonin tablet 9 mg  9 mg Oral QHS Emokpae, Ejiroghene E, MD   9 mg at 03/29/22 0109   metoprolol succinate (TOPROL-XL) 24 hr tablet 50 mg  50 mg Oral Daily Emokpae, Ejiroghene E, MD       ondansetron (ZOFRAN-ODT) disintegrating tablet 4 mg  4 mg Oral Once Emokpae, Ejiroghene E, MD       polyethylene glycol (MIRALAX / GLYCOLAX) packet 17 g  17 g Oral  Daily PRN Emokpae, Ejiroghene E, MD       sevelamer carbonate (RENVELA) tablet 800 mg  800 mg Oral TID WC Emokpae, Ejiroghene E, MD   800 mg at 03/29/22 0829   vancomycin (VANCOCIN) capsule 125 mg  125 mg Oral Daily Lu Duffel, RPH       Followed by   Derrill Memo ON 04/25/2022] vancomycin (VANCOCIN) capsule 125 mg  125 mg Oral Meta Hatchet, [facility]       Current Outpatient Medications  Medication Sig Dispense Refill   albuterol (PROVENTIL) (2.5 MG/3ML) 0.083% nebulizer solution Take 3 mLs (2.5 mg total) by nebulization every 6 (six) hours as needed for wheezing or shortness of breath. 75 mL 0   cyanocobalamin (,VITAMIN B-12,) 1000 MCG/ML injection Inject 1,000 mcg into the muscle every 30 (thirty) days.     dicyclomine (BENTYL) 10 MG capsule TAKE 1 CAPSULE (10 MG TOTAL) BY MOUTH 2 (TWO) TIMES DAILY AS NEEDED FOR SPASMS. 180 capsule 1   ferrous sulfate 325 (65 FE) MG EC tablet Take 325 mg by mouth daily with breakfast.     gabapentin (NEURONTIN) 100 MG capsule Take 2 capsules (200 mg total) by mouth at bedtime. (Patient taking differently: Take 200 mg by mouth 2 (two) times daily.) 60 capsule 2   isosorbide mononitrate (IMDUR) 60 MG 24 hr tablet Take 1 tablet (60 mg total) by mouth daily. (Patient taking differently: Take 30 mg by mouth daily.) 30 tablet 2   loperamide (IMODIUM) 2 MG capsule Take 2 mg by mouth as needed for diarrhea or loose stools.     Melatonin 10 MG CAPS Take 1 capsule by mouth daily.     metoprolol succinate (TOPROL-XL) 50 MG 24 hr tablet TAKE 1 TABLET BY MOUTH EVERY DAY 30 tablet 2   mupirocin ointment (BACTROBAN) 2 % Apply 1 Application topically daily. 22 g 0   omeprazole (PRILOSEC) 20 MG capsule Take 20 mg by mouth daily.     ondansetron (ZOFRAN) 4 MG tablet Take 1 tablet (4 mg total) by mouth daily as needed for nausea or vomiting. 20 tablet 1   potassium chloride SA (KLOR-CON M) 20 MEQ tablet Take 20 mEq by mouth 2 (two) times daily.     Probiotic Product  (PROBIOTIC GUMMIES PO) Take 1 tablet by mouth every evening.     promethazine (PHENERGAN) 12.5 MG tablet Take 12.5 mg by mouth every 6 (six) hours as needed.     sertraline (ZOLOFT) 50 MG tablet TAKE HALF A TABLET (25MG) ONCE DAILY. (Patient taking differently: Take 25 mg by mouth daily. Take half a tablet (41m) once daily.) 45 tablet 1   sevelamer carbonate (RENVELA) 800 MG tablet Take 1 tablet (800 mg total) by mouth 3 (three) times daily with meals. 90 tablet  0   vancomycin (VANCOCIN) 125 MG capsule TAKE 1 CAPSULE (125 MG TOTAL) BY MOUTH 4 (FOUR) TIMES DAILY FOR 14 DAYS, THEN 1 CAPSULE (125 MG TOTAL) 2 (TWO) TIMES DAILY FOR 7 DAYS, THEN 1 CAPSULE (125 MG TOTAL) DAILY FOR 7 DAYS, THEN 1 CAPSULE (125 MG TOTAL) EVERY OTHER DAY FOR 28 DAYS. 91 capsule 0   blood glucose meter kit and supplies Dispense based on patient and insurance preference. Use up to four times daily as directed. (FOR ICD-10 E10.9, E11.9). 1 each 0   furosemide (LASIX) 80 MG tablet Please take 80 mg of Lasix on none HD days on Sunday, Monday, Wednesday and Friday (Patient not taking: Reported on 03/21/2022) 30 tablet 12   GVOKE HYPOPEN 1-PACK 1 MG/0.2ML SOAJ INJECT 1 MG INTO THE SKIN AS NEEDED (SEVERE HYPOGLYCEMIA). (Patient not taking: Reported on 03/28/2022) 0.2 mL 0   oxyCODONE-acetaminophen (PERCOCET) 5-325 MG tablet Take 1 tablet by mouth every 6 (six) hours as needed for severe pain. (Patient not taking: Reported on 03/21/2022) 8 tablet 0     ALLERGIES Amoxicillin, Clarithromycin, Codeine, Hydrocodone, Moxifloxacin, Penicillins, Sulfa antibiotics, Nitrofurantoin, Crab (diagnostic), and Latex  MEDICAL HISTORY Past Medical History:  Diagnosis Date   Anemia    Anxiety    Asthma    Bipolar disorder ([place]) 2018   type 2   CHF (congestive heart failure) (HCC)    CIDP (chronic inflammatory demyelinating polyneuropathy) (HCC)    CKD (chronic kidney disease)    COPD (chronic obstructive pulmonary disease) ([place])    Depression     Depression with anxiety 12/16/2021   Depression, recurrent ([place]) 12/07/2021   Diabetes mellitus without complication (HCC)    type 2   Diabetic foot ulcers (HCC)    Fibromyalgia    GERD (gastroesophageal reflux disease)    Hepatitis    Hep as a child - patient can give blood   History of blood transfusion 08/2021   1 unit per patient   History of esophagogastroduodenoscopy (EGD) 2018   History of kidney stones    passed stones   HLD (hyperlipidemia)    Hypertension    Migraines    Myocardial infarction (HCC)    x 2   Neuropathy    legs and feet - bilateral   Sleep apnea 01/25/2022     SOCIAL HISTORY Social History   Socioeconomic History   Marital status: Married    Spouse name: Not on file   Number of children: Not on file   Years of education: Not on file   Highest education level: Not on file  Occupational History   Not on file  Tobacco Use   Smoking status: Every Day    Packs/day: 0.50    Types: Cigarettes   Smokeless tobacco: Never   Tobacco comments:    Slightly less than 0.5ppd as of 02/25/22  Vaping Use   Vaping Use: Never used  Substance and Sexual Activity   Alcohol use: Never   Drug use: Never   Sexual activity: Yes    Birth control/protection: Surgical    Comment: Hysterectomy  Other Topics Concern   Not on file  Social History Narrative   Not on file   Social Determinants of Health   Financial Resource Strain: Not on file  Food Insecurity: Not on file  Transportation Needs: Not on file  Physical Activity: Not on file  Stress: Not on file  Social Connections: Not on file  Intimate Partner Violence: Not on file  FAMILY HISTORY History reviewed. No pertinent family history.   Review of Systems: 12 systems were reviewed and negative except per HPI  Physical Exam: Vitals:   03/29/22 0800 03/29/22 0830  BP: 100/65 (!) 105/57  Pulse:    Resp: 10 16  Temp:    SpO2:     No intake/output data recorded.  Intake/Output Summary  (Last 24 hours) at 03/29/2022 0905 Last data filed at 03/29/2022 0631 Gross per 24 hour  Intake 360 ml  Output --  Net 360 ml   General: well-appearing, no acute distress HEENT: anicteric sclera, MMM CV: normal rate, no murmurs, no edema Lungs: bilateral chest rise, normal wob Abd: soft, non-tender, non-distended, LLQ PD cath c/d/I (tubing clear) Skin: no visible lesions or rashes Ext: erythema/tender to palpation b/l Les, b/l feet in boots Neuro: normal speech, no gross focal deficits   Test Results Reviewed Lab Results  Component Value Date   NA 132 (L) 03/29/2022   K 3.0 (L) 03/29/2022   CL 105 03/29/2022   CO2 13 (L) 03/29/2022   BUN 60 (H) 03/29/2022   CREATININE 10.32 (H) 03/29/2022   CALCIUM 6.9 (L) 03/29/2022   ALBUMIN 2.8 (L) 03/21/2022   PHOS 5.5 (H) 12/22/2021    I have reviewed relevant outside healthcare records

## 2022-03-31 ENCOUNTER — Other Ambulatory Visit: Payer: Self-pay

## 2022-03-31 ENCOUNTER — Emergency Department (HOSPITAL_COMMUNITY): Payer: 59

## 2022-03-31 ENCOUNTER — Encounter (HOSPITAL_COMMUNITY): Payer: Self-pay

## 2022-03-31 ENCOUNTER — Emergency Department (HOSPITAL_COMMUNITY)
Admission: EM | Admit: 2022-03-31 | Discharge: 2022-03-31 | Disposition: A | Discharge status: Home / Self Care | Payer: 59 | Attending: Emergency Medicine | Admitting: Emergency Medicine

## 2022-03-31 DIAGNOSIS — Z79899 Other long term (current) drug therapy: Secondary | ICD-10-CM | POA: Diagnosis not present

## 2022-03-31 DIAGNOSIS — Z992 Dependence on renal dialysis: Secondary | ICD-10-CM | POA: Insufficient documentation

## 2022-03-31 DIAGNOSIS — I120 Hypertensive chronic kidney disease with stage 5 chronic kidney disease or end stage renal disease: Secondary | ICD-10-CM | POA: Insufficient documentation

## 2022-03-31 DIAGNOSIS — E876 Hypokalemia: Secondary | ICD-10-CM | POA: Diagnosis not present

## 2022-03-31 DIAGNOSIS — Z91040 Latex allergy status: Secondary | ICD-10-CM | POA: Diagnosis not present

## 2022-03-31 DIAGNOSIS — N186 End stage renal disease: Secondary | ICD-10-CM | POA: Diagnosis not present

## 2022-03-31 DIAGNOSIS — R0789 Other chest pain: Secondary | ICD-10-CM | POA: Diagnosis not present

## 2022-03-31 DIAGNOSIS — R079 Chest pain, unspecified: Secondary | ICD-10-CM | POA: Diagnosis not present

## 2022-03-31 DIAGNOSIS — E1122 Type 2 diabetes mellitus with diabetic chronic kidney disease: Secondary | ICD-10-CM | POA: Insufficient documentation

## 2022-03-31 LAB — TROPONIN I (HIGH SENSITIVITY)
Troponin I (High Sensitivity): 32 ng/L — ABNORMAL HIGH (ref <18)
Troponin I (High Sensitivity): 29 ng/L — ABNORMAL HIGH (ref <18)

## 2022-03-31 LAB — BASIC METABOLIC PANEL
Anion gap: 18 — ABNORMAL HIGH (ref 5–15)
BUN: 58 mg/dL — ABNORMAL HIGH (ref 6–20)
CO2: 13 mmol/L — ABNORMAL LOW (ref 22–32)
Calcium: 7.1 mg/dL — ABNORMAL LOW (ref 8.9–10.3)
Chloride: 100 mmol/L (ref 98–111)
Creatinine, Ser: 10.3 mg/dL — ABNORMAL HIGH (ref 0.44–1.00)
GFR, Estimated: 4 mL/min — ABNORMAL LOW (ref >60)
Glucose, Bld: 259 mg/dL — ABNORMAL HIGH (ref 70–99)
Potassium: 3.2 mmol/L — ABNORMAL LOW (ref 3.5–5.1)
Sodium: 131 mmol/L — ABNORMAL LOW (ref 135–145)

## 2022-03-31 LAB — CBC
HCT: 28 % — ABNORMAL LOW (ref 36.0–46.0)
Hemoglobin: 9.5 g/dL — ABNORMAL LOW (ref 12.0–15.0)
MCH: 27.9 pg (ref 26.0–34.0)
MCHC: 33.9 g/dL (ref 30.0–36.0)
MCV: 82.4 fL (ref 80.0–100.0)
Platelets: 301 10*3/uL (ref 150–400)
RBC: 3.4 MIL/uL — ABNORMAL LOW (ref 3.87–5.11)
RDW: 16.9 % — ABNORMAL HIGH (ref 11.5–15.5)
WBC: 24.3 10*3/uL — ABNORMAL HIGH (ref 4.0–10.5)
nRBC: 0 % (ref 0.0–0.2)

## 2022-03-31 MED ORDER — ONDANSETRON 4 MG PO TBDP
4.0000 mg | ORAL_TABLET | Freq: Once | ORAL | Status: AC
  Administered 2022-03-31: 4 mg via ORAL
  Filled 2022-03-31: qty 1

## 2022-03-31 MED ORDER — OXYCODONE-ACETAMINOPHEN 5-325 MG PO TABS
1.0000 | ORAL_TABLET | Freq: Once | ORAL | Status: AC
  Administered 2022-03-31: 1 via ORAL
  Filled 2022-03-31: qty 1

## 2022-03-31 NOTE — ED Notes (Signed)
Pt requested pain medications. MD made aware. No new orders.

## 2022-03-31 NOTE — ED Provider Notes (Signed)
[facility] EMERGENCY DEPARTMENT Provider Note   CSN: [id number] Arrival date & time: 03/31/22  1907     History  Chief Complaint  Patient presents with   Chest Pain    Tracy Walsh is a 56 y.o. female.  Patient seen and admitted December 14 through December 15 for very similar complaints.  Patient with persistent substernal and right-sided chest pain.  No hypoxia.  Patient is also on peritoneal dialysis.  Patient also has some bilateral lower extremity diabetic ulcers and some erythema.  Patient is on oral vancomycin for C. difficile and was discharged with some doses of oxycodone for pain management.  And patient received some IV antibiotics for the cellulitis.  Main concern here tonight is the chest pain.  Patient denies any abdominal pain.  Patient's had some persistent vomiting for about a week.  Past medical history significant for congestive heart failure diabetes end-stage renal disease on peritoneal dialysis neuropathy migraines diabetic foot ulcers bipolar disorder hypertension hyperlipidemia fibromyalgia.  Patient has known history of coronary artery disease with a stent in 2022.  But states says she is not necessarily followed by cardiology.  Patient had her appendix removed.  Patient had total abdominal hysterectomy.  Patient is everyday smoker.       Home Medications Prior to Admission medications   Medication Sig Start Date End Date Taking? Authorizing Provider  albuterol (PROVENTIL) (2.5 MG/3ML) 0.083% nebulizer solution Take 3 mLs (2.5 mg total) by nebulization every 6 (six) hours as needed for wheezing or shortness of breath. 12/28/21   [name], MD  blood glucose meter kit and supplies Dispense based on patient and insurance preference. Use up to four times daily as directed. (FOR ICD-10 E10.9, E11.9). 01/25/22   [name], MD  calcium carbonate (TUMS - DOSED IN MG ELEMENTAL CALCIUM) 500 MG chewable tablet Chew 1 tablet (200 mg of elemental calcium  total) by mouth daily. 03/30/22 04/29/22  Manuella Ghazi, Pratik D, DO  cyanocobalamin (,VITAMIN B-12,) 1000 MCG/ML injection Inject 1,000 mcg into the muscle every 30 (thirty) days. 09/17/21   [provider]  dicyclomine (BENTYL) 10 MG capsule TAKE 1 CAPSULE (10 MG TOTAL) BY MOUTH 2 (TWO) TIMES DAILY AS NEEDED FOR SPASMS. 01/24/22   Carlan, Deatra Robinson, NP  ferrous sulfate 325 (65 FE) MG EC tablet Take 325 mg by mouth daily with breakfast. 02/19/21   [provider]  furosemide (LASIX) 80 MG tablet Please take 80 mg of Lasix on none HD days on Sunday, Monday, Wednesday and Friday Patient not taking: Reported on 03/21/2022 12/22/21   Elgergawy, Silver Huguenin, MD  gabapentin (NEURONTIN) 100 MG capsule Take 2 capsules (200 mg total) by mouth at bedtime. Patient taking differently: Take 200 mg by mouth 2 (two) times daily. 12/22/21 03/28/22  Elgergawy, Silver Huguenin, MD  GVOKE HYPOPEN 1-PACK 1 MG/0.2ML SOAJ INJECT 1 MG INTO THE SKIN AS NEEDED (SEVERE HYPOGLYCEMIA). Patient not taking: Reported on 03/28/2022 01/25/22   [name], MD  isosorbide mononitrate (IMDUR) 60 MG 24 hr tablet Take 1 tablet (60 mg total) by mouth daily. Patient taking differently: Take 30 mg by mouth daily. 02/27/22 05/28/22  [name], MD  loperamide (IMODIUM) 2 MG capsule Take 2 mg by mouth as needed for diarrhea or loose stools.    [provider]  Melatonin 10 MG CAPS Take 1 capsule by mouth daily.    [provider]  metoprolol succinate (TOPROL-XL) 50 MG 24 hr tablet TAKE 1 TABLET BY  MOUTH EVERY DAY 02/28/22   [name], MD  mupirocin ointment (BACTROBAN) 2 % Apply 1 Application topically daily. 10/31/21   Suzan Slick, NP  omeprazole (PRILOSEC) 20 MG capsule Take 20 mg by mouth daily.    [provider]  ondansetron (ZOFRAN) 4 MG tablet Take 1 tablet (4 mg total) by mouth daily as needed for nausea or vomiting. 12/22/21 12/22/22  Elgergawy, Silver Huguenin, MD  oxyCODONE-acetaminophen (PERCOCET)  5-325 MG tablet Take 1 tablet by mouth every 6 (six) hours as needed for severe pain. 03/29/22   Manuella Ghazi, Pratik D, DO  potassium chloride SA (KLOR-CON M) 20 MEQ tablet Take 20 mEq by mouth 2 (two) times daily. 02/25/22   [provider]  Probiotic Product (PROBIOTIC GUMMIES PO) Take 1 tablet by mouth every evening.    [provider]  promethazine (PHENERGAN) 12.5 MG tablet Take 12.5 mg by mouth every 6 (six) hours as needed. 03/12/22   [provider]  sertraline (ZOLOFT) 50 MG tablet TAKE HALF A TABLET (25MG) ONCE DAILY. Patient taking differently: Take 25 mg by mouth daily. Take half a tablet (31m) once daily. 02/20/22   SJacquelynn Cree MD  sevelamer carbonate (RENVELA) 800 MG tablet Take 1 tablet (800 mg total) by mouth 3 (three) times daily with meals. 12/22/21   Elgergawy, DSilver Huguenin MD  sodium bicarbonate 650 MG tablet Take 2 tablets (1,300 mg total) by mouth 3 (three) times daily. 03/29/22 04/28/22  SManuella Ghazi Pratik D, DO  vancomycin (VANCOCIN) 125 MG capsule TAKE 1 CAPSULE (125 MG TOTAL) BY MOUTH 4 (FOUR) TIMES DAILY FOR 14 DAYS, THEN 1 CAPSULE (125 MG TOTAL) 2 (TWO) TIMES DAILY FOR 7 DAYS, THEN 1 CAPSULE (125 MG TOTAL) DAILY FOR 7 DAYS, THEN 1 CAPSULE (125 MG TOTAL) EVERY OTHER DAY FOR 28 DAYS. 03/04/22 04/29/22  CHarvel Quale MD      Allergies    Amoxicillin, Clarithromycin, Codeine, Hydrocodone, Moxifloxacin, Penicillins, Sulfa antibiotics, Nitrofurantoin, Crab (diagnostic), and Latex    Review of Systems   Review of Systems  Constitutional:  Negative for chills and fever.  HENT:  Negative for rhinorrhea and sore throat.   Eyes:  Negative for visual disturbance.  Respiratory:  Negative for cough and shortness of breath.   Cardiovascular:  Positive for chest pain and leg swelling.  Gastrointestinal:  Positive for nausea and vomiting. Negative for abdominal pain and diarrhea.  Genitourinary:  Negative for dysuria.  Musculoskeletal:  Negative for back  pain and neck pain.  Skin:  Negative for rash.  Neurological:  Negative for dizziness, light-headedness and headaches.  Hematological:  Does not bruise/bleed easily.  Psychiatric/Behavioral:  Negative for confusion.     Physical Exam Updated Vital Signs BP (!) 108/58   Pulse 78   Temp 97.8 F (36.6 C)   Resp 11   Ht 1.727 m (_0 )   Wt 77.1 kg   SpO2 98%   BMI 25.84 kg/m  Physical Exam Vitals and nursing note reviewed.  Constitutional:      General: She is not in acute distress.    Appearance: She is well-developed. She is not ill-appearing.  HENT:     Head: Normocephalic and atraumatic.  Eyes:     Conjunctiva/sclera: Conjunctivae normal.  Cardiovascular:     Rate and Rhythm: Normal rate and regular rhythm.     Heart sounds: No murmur heard. Pulmonary:     Effort: Pulmonary effort is normal. No respiratory distress.     Breath sounds:  Normal breath sounds. No decreased breath sounds, wheezing, rhonchi or rales.  Chest:     Chest wall: No tenderness.  Abdominal:     Palpations: Abdomen is soft.     Tenderness: There is no abdominal tenderness. There is no guarding.     Comments: Abdomen soft nontender no erythema peritoneal dialysis catheter in place.  Musculoskeletal:        General: No swelling.     Cervical back: Neck supple.     Right lower leg: No edema.     Left lower leg: No edema.     Comments: Some bilateral erythema.  Skin:    General: Skin is warm and dry.     Capillary Refill: Capillary refill takes less than 2 seconds.  Neurological:     General: No focal deficit present.     Mental Status: She is alert and oriented to person, place, and time.  Psychiatric:        Mood and Affect: Mood normal.     ED Results / Procedures / Treatments   Labs (all labs ordered are listed, but only abnormal results are displayed) Labs Reviewed  BASIC METABOLIC PANEL - Abnormal; Notable for the following components:      Result Value   Sodium 131 (*)     Potassium 3.2 (*)    CO2 13 (*)    Glucose, Bld 259 (*)    BUN 58 (*)    Creatinine, Ser 10.30 (*)    Calcium 7.1 (*)    GFR, Estimated 4 (*)    Anion gap 18 (*)    All other components within normal limits  CBC - Abnormal; Notable for the following components:   WBC 24.3 (*)    RBC 3.40 (*)    Hemoglobin 9.5 (*)    HCT 28.0 (*)    RDW 16.9 (*)    All other components within normal limits  TROPONIN I (HIGH SENSITIVITY) - Abnormal; Notable for the following components:   Troponin I (High Sensitivity) 32 (*)    All other components within normal limits  TROPONIN I (HIGH SENSITIVITY) - Abnormal; Notable for the following components:   Troponin I (High Sensitivity) 29 (*)    All other components within normal limits    EKG EKG Interpretation  Date/Time:  Sunday March 31 2022 19:16:50 EST Ventricular Rate:  76 PR Interval:  190 QRS Duration: 132 QT Interval:  448 QTC Calculation: 504 R Axis:   -37 Text Interpretation: Normal sinus rhythm Left axis deviation Left ventricular hypertrophy with QRS widening ( R in aVL , Cornell product ) Cannot rule out Septal infarct (cited on or before 28-Mar-2022) Abnormal ECG When compared with ECG of 28-Mar-2022 13:08, Questionable change in initial forces of Septal leads Confirmed by Fredia Sorrow [phone]) on 03/31/2022 10:16:44 PM  Radiology DG Chest 2 View  Result Date: 03/31/2022 CLINICAL DATA:  Chest pain EXAM: CHEST - 2 VIEW COMPARISON:  CT chest abdomen and pelvis March 28, 2022, chest CT March 28, 2022 FINDINGS: Stable positioning of left chest wall port catheter. Partially visualized neurostimulator device in place. The cardiomediastinal silhouette is unchanged in contour. No focal pulmonary opacity. No pleural effusion or pneumothorax. The visualized upper abdomen is unremarkable. No acute osseous abnormality. IMPRESSION: No acute cardiopulmonary abnormality. Electronically Signed   By: Beryle Flock M.D.   On: 03/31/2022  19:41    Procedures Procedures    Medications Ordered in ED Medications  ondansetron (ZOFRAN-ODT) disintegrating tablet 4 mg (4  mg Oral Given 03/31/22 2048)  oxyCODONE-acetaminophen (PERCOCET/ROXICET) 5-325 MG per tablet 1 tablet (1 tablet Oral Given 03/31/22 2233)  ondansetron (ZOFRAN-ODT) disintegrating tablet 4 mg (4 mg Oral Given 03/31/22 2233)    ED Course/ Medical Decision Making/ A&P                           Medical Decision Making Amount and/or Complexity of Data Reviewed Labs: ordered. Radiology: ordered.  Risk Prescription drug management.  Workup for the chest pain very reassuring.  Troponins were 32 and then dropped down to 29.  This improved from when he was seen on 14 December.  Potassium also somewhat improved to 3.2.  Still slightly low.  White blood cell count did jump from 19,000-24,000.  Hemoglobin pretty much stable.  Chest x-ray here negative EKG without any acute findings.  There are some question of maybe anterior septal abnormality.  But again troponins since 14 December have all been downgoing.  Patient will be given a dose of Percocet here was given Zofran when she first came.  No vomiting here.  Patient is Zofran at home.  Will give another dose of Zofran now.  And will give referral for follow-up with cardiology here in [place].  Also recommend she follow-up with her nephrologist and her primary care doctor for further evaluation of some of the chronic conditions that are ongoing.  No indications for admission due to the chest pain.  Oxygen saturations are excellent upper 90s not tachycardic no concerns for pulmonary embolus.  No concerns for peritonitis.  Abdomen is soft and nontender.  Dialysis catheter in place.  No erythema.   Final Clinical Impression(s) / ED Diagnoses Final diagnoses:  Atypical chest pain    Rx / DC Orders ED Discharge Orders     None         Fredia Sorrow, MD 03/31/22 2237

## 2022-03-31 NOTE — Discharge Instructions (Signed)
Take the Zofran that you have at home.  Make an appointment to follow-up with cardiology here in [place].  Recommend you follow-up with your nephrologist and your primary care doctor for the other concerns.  No evidence of any acute heart problems here tonight.  Which is very reassuring there is no evidence of that during your recent hospitalization.

## 2022-03-31 NOTE — ED Triage Notes (Signed)
Pt c/o centralized chest pain that started around 1600, N&V, no radiation.   Was seen Thursday for the same but states [facility] didn't have a bed and was told to come back if it got worse

## 2022-04-01 ENCOUNTER — Emergency Department (HOSPITAL_COMMUNITY)
Admission: EM | Admit: 2022-04-01 | Discharge: 2022-04-01 | Discharge status: Against Medical Advice | Payer: 59 | Attending: Emergency Medicine | Admitting: Emergency Medicine

## 2022-04-01 DIAGNOSIS — Z1152 Encounter for screening for COVID-19: Secondary | ICD-10-CM | POA: Diagnosis not present

## 2022-04-01 DIAGNOSIS — Z5321 Procedure and treatment not carried out due to patient leaving prior to being seen by health care provider: Secondary | ICD-10-CM | POA: Diagnosis not present

## 2022-04-01 DIAGNOSIS — R609 Edema, unspecified: Secondary | ICD-10-CM | POA: Diagnosis not present

## 2022-04-01 DIAGNOSIS — R531 Weakness: Secondary | ICD-10-CM | POA: Insufficient documentation

## 2022-04-01 DIAGNOSIS — I959 Hypotension, unspecified: Secondary | ICD-10-CM | POA: Diagnosis not present

## 2022-04-01 DIAGNOSIS — Z743 Need for continuous supervision: Secondary | ICD-10-CM | POA: Diagnosis not present

## 2022-04-01 DIAGNOSIS — R52 Pain, unspecified: Secondary | ICD-10-CM | POA: Diagnosis not present

## 2022-04-01 DIAGNOSIS — L03115 Cellulitis of right lower limb: Secondary | ICD-10-CM | POA: Insufficient documentation

## 2022-04-01 LAB — RESP PANEL BY RT-PCR (RSV, FLU A&B, COVID)  RVPGX2
Influenza A by PCR: NEGATIVE
Influenza B by PCR: NEGATIVE
Resp Syncytial Virus by PCR: NEGATIVE
SARS Coronavirus 2 by RT PCR: NEGATIVE

## 2022-04-01 NOTE — ED Notes (Signed)
Notified by registration staff that pt and husband were witnessed leaving the ED. Pt unable to be counseled on need to stay.

## 2022-04-01 NOTE — ED Triage Notes (Signed)
Pt arrives via [facility] from home for continued weakness. Pt was seen yesterday for CP which has resolved. Pt denies cough, fever. Endorses N/V last night, none today. Pt does PD nightly at home and had full tx today.   Pt also would like her R leg cellulitis evaluated, which her husband feels like was not evaluated fully last night.

## 2022-04-01 NOTE — Telephone Encounter (Signed)
Left message to return call with patient.   

## 2022-04-02 ENCOUNTER — Inpatient Hospital Stay
Admission: EM | Admit: 2022-04-02 | Discharge: 2022-04-15 | DRG: 602 | Disposition: E | Payer: 59 | Attending: Internal Medicine | Admitting: Internal Medicine

## 2022-04-02 ENCOUNTER — Emergency Department: Payer: 59

## 2022-04-02 ENCOUNTER — Other Ambulatory Visit: Payer: Self-pay

## 2022-04-02 ENCOUNTER — Telehealth: Payer: Self-pay | Admitting: Internal Medicine

## 2022-04-02 DIAGNOSIS — E11621 Type 2 diabetes mellitus with foot ulcer: Secondary | ICD-10-CM | POA: Diagnosis not present

## 2022-04-02 DIAGNOSIS — E1165 Type 2 diabetes mellitus with hyperglycemia: Secondary | ICD-10-CM | POA: Diagnosis not present

## 2022-04-02 DIAGNOSIS — Z992 Dependence on renal dialysis: Secondary | ICD-10-CM

## 2022-04-02 DIAGNOSIS — Z955 Presence of coronary angioplasty implant and graft: Secondary | ICD-10-CM

## 2022-04-02 DIAGNOSIS — I2510 Atherosclerotic heart disease of native coronary artery without angina pectoris: Secondary | ICD-10-CM | POA: Diagnosis present

## 2022-04-02 DIAGNOSIS — Z90710 Acquired absence of both cervix and uterus: Secondary | ICD-10-CM

## 2022-04-02 DIAGNOSIS — L97429 Non-pressure chronic ulcer of left heel and midfoot with unspecified severity: Secondary | ICD-10-CM | POA: Diagnosis present

## 2022-04-02 DIAGNOSIS — I5042 Chronic combined systolic (congestive) and diastolic (congestive) heart failure: Secondary | ICD-10-CM | POA: Diagnosis present

## 2022-04-02 DIAGNOSIS — E876 Hypokalemia: Secondary | ICD-10-CM | POA: Diagnosis present

## 2022-04-02 DIAGNOSIS — L03119 Cellulitis of unspecified part of limb: Secondary | ICD-10-CM

## 2022-04-02 DIAGNOSIS — F3181 Bipolar II disorder: Secondary | ICD-10-CM | POA: Diagnosis present

## 2022-04-02 DIAGNOSIS — Z888 Allergy status to other drugs, medicaments and biological substances status: Secondary | ICD-10-CM

## 2022-04-02 DIAGNOSIS — L03116 Cellulitis of left lower limb: Secondary | ICD-10-CM | POA: Diagnosis present

## 2022-04-02 DIAGNOSIS — Z880 Allergy status to penicillin: Secondary | ICD-10-CM

## 2022-04-02 DIAGNOSIS — I132 Hypertensive heart and chronic kidney disease with heart failure and with stage 5 chronic kidney disease, or end stage renal disease: Secondary | ICD-10-CM | POA: Diagnosis present

## 2022-04-02 DIAGNOSIS — Z885 Allergy status to narcotic agent status: Secondary | ICD-10-CM

## 2022-04-02 DIAGNOSIS — G4730 Sleep apnea, unspecified: Secondary | ICD-10-CM | POA: Diagnosis present

## 2022-04-02 DIAGNOSIS — M7731 Calcaneal spur, right foot: Secondary | ICD-10-CM | POA: Diagnosis not present

## 2022-04-02 DIAGNOSIS — E1122 Type 2 diabetes mellitus with diabetic chronic kidney disease: Secondary | ICD-10-CM | POA: Diagnosis present

## 2022-04-02 DIAGNOSIS — Z91013 Allergy to seafood: Secondary | ICD-10-CM

## 2022-04-02 DIAGNOSIS — M7732 Calcaneal spur, left foot: Secondary | ICD-10-CM | POA: Diagnosis not present

## 2022-04-02 DIAGNOSIS — A0471 Enterocolitis due to Clostridium difficile, recurrent: Secondary | ICD-10-CM | POA: Diagnosis present

## 2022-04-02 DIAGNOSIS — Z79899 Other long term (current) drug therapy: Secondary | ICD-10-CM

## 2022-04-02 DIAGNOSIS — N186 End stage renal disease: Secondary | ICD-10-CM | POA: Diagnosis present

## 2022-04-02 DIAGNOSIS — Z882 Allergy status to sulfonamides status: Secondary | ICD-10-CM

## 2022-04-02 DIAGNOSIS — F419 Anxiety disorder, unspecified: Secondary | ICD-10-CM | POA: Diagnosis present

## 2022-04-02 DIAGNOSIS — N2581 Secondary hyperparathyroidism of renal origin: Secondary | ICD-10-CM | POA: Diagnosis present

## 2022-04-02 DIAGNOSIS — D72829 Elevated white blood cell count, unspecified: Secondary | ICD-10-CM | POA: Diagnosis present

## 2022-04-02 DIAGNOSIS — S91301A Unspecified open wound, right foot, initial encounter: Secondary | ICD-10-CM | POA: Diagnosis not present

## 2022-04-02 DIAGNOSIS — Z9049 Acquired absence of other specified parts of digestive tract: Secondary | ICD-10-CM

## 2022-04-02 DIAGNOSIS — Z981 Arthrodesis status: Secondary | ICD-10-CM

## 2022-04-02 DIAGNOSIS — D631 Anemia in chronic kidney disease: Secondary | ICD-10-CM | POA: Diagnosis present

## 2022-04-02 DIAGNOSIS — M797 Fibromyalgia: Secondary | ICD-10-CM | POA: Diagnosis present

## 2022-04-02 DIAGNOSIS — S91302A Unspecified open wound, left foot, initial encounter: Secondary | ICD-10-CM | POA: Diagnosis not present

## 2022-04-02 DIAGNOSIS — J4489 Other specified chronic obstructive pulmonary disease: Secondary | ICD-10-CM | POA: Diagnosis present

## 2022-04-02 DIAGNOSIS — Z881 Allergy status to other antibiotic agents status: Secondary | ICD-10-CM

## 2022-04-02 DIAGNOSIS — M6518 Other infective (teno)synovitis, other site: Secondary | ICD-10-CM | POA: Diagnosis present

## 2022-04-02 DIAGNOSIS — L97419 Non-pressure chronic ulcer of right heel and midfoot with unspecified severity: Secondary | ICD-10-CM | POA: Diagnosis not present

## 2022-04-02 DIAGNOSIS — L03115 Cellulitis of right lower limb: Principal | ICD-10-CM | POA: Diagnosis present

## 2022-04-02 DIAGNOSIS — L0390 Cellulitis, unspecified: Principal | ICD-10-CM | POA: Diagnosis present

## 2022-04-02 DIAGNOSIS — E1140 Type 2 diabetes mellitus with diabetic neuropathy, unspecified: Secondary | ICD-10-CM | POA: Diagnosis present

## 2022-04-02 DIAGNOSIS — G6181 Chronic inflammatory demyelinating polyneuritis: Secondary | ICD-10-CM | POA: Diagnosis present

## 2022-04-02 DIAGNOSIS — Z91040 Latex allergy status: Secondary | ICD-10-CM

## 2022-04-02 DIAGNOSIS — E785 Hyperlipidemia, unspecified: Secondary | ICD-10-CM | POA: Diagnosis present

## 2022-04-02 DIAGNOSIS — I469 Cardiac arrest, cause unspecified: Secondary | ICD-10-CM | POA: Diagnosis not present

## 2022-04-02 DIAGNOSIS — F17210 Nicotine dependence, cigarettes, uncomplicated: Secondary | ICD-10-CM | POA: Diagnosis present

## 2022-04-02 DIAGNOSIS — K219 Gastro-esophageal reflux disease without esophagitis: Secondary | ICD-10-CM | POA: Diagnosis present

## 2022-04-02 DIAGNOSIS — I252 Old myocardial infarction: Secondary | ICD-10-CM

## 2022-04-02 LAB — BASIC METABOLIC PANEL
Anion gap: 21 — ABNORMAL HIGH (ref 5–15)
BUN: 58 mg/dL — ABNORMAL HIGH (ref 6–20)
CO2: 14 mmol/L — ABNORMAL LOW (ref 22–32)
Calcium: 7.3 mg/dL — ABNORMAL LOW (ref 8.9–10.3)
Chloride: 98 mmol/L (ref 98–111)
Creatinine, Ser: 10.13 mg/dL — ABNORMAL HIGH (ref 0.44–1.00)
GFR, Estimated: 4 mL/min — ABNORMAL LOW (ref >60)
Glucose, Bld: 273 mg/dL — ABNORMAL HIGH (ref 70–99)
Potassium: 2.9 mmol/L — ABNORMAL LOW (ref 3.5–5.1)
Sodium: 133 mmol/L — ABNORMAL LOW (ref 135–145)

## 2022-04-02 LAB — CBC WITH DIFFERENTIAL/PLATELET
Abs Immature Granulocytes: 0.5 10*3/uL — ABNORMAL HIGH (ref 0.00–0.07)
Basophils Absolute: 0.1 10*3/uL (ref 0.0–0.1)
Basophils Relative: 0 %
Eosinophils Absolute: 0.1 10*3/uL (ref 0.0–0.5)
Eosinophils Relative: 0 %
HCT: 27.5 % — ABNORMAL LOW (ref 36.0–46.0)
Hemoglobin: 9.4 g/dL — ABNORMAL LOW (ref 12.0–15.0)
Immature Granulocytes: 1 %
Lymphocytes Relative: 1 %
Lymphs Abs: 0.3 10*3/uL — ABNORMAL LOW (ref 0.7–4.0)
MCH: 27.8 pg (ref 26.0–34.0)
MCHC: 34.2 g/dL (ref 30.0–36.0)
MCV: 81.4 fL (ref 80.0–100.0)
Monocytes Absolute: 1.5 10*3/uL — ABNORMAL HIGH (ref 0.1–1.0)
Monocytes Relative: 4 %
Neutro Abs: 32.4 10*3/uL — ABNORMAL HIGH (ref 1.7–7.7)
Neutrophils Relative %: 94 %
Platelets: 339 10*3/uL (ref 150–400)
RBC: 3.38 MIL/uL — ABNORMAL LOW (ref 3.87–5.11)
RDW: 16.8 % — ABNORMAL HIGH (ref 11.5–15.5)
Smear Review: NORMAL
WBC: 34.9 10*3/uL — ABNORMAL HIGH (ref 4.0–10.5)
nRBC: 0 % (ref 0.0–0.2)

## 2022-04-02 LAB — CULTURE, BLOOD (ROUTINE X 2)
Culture: NO GROWTH
Culture: NO GROWTH
Special Requests: ADEQUATE

## 2022-04-02 LAB — LACTIC ACID, PLASMA
Lactic Acid, Venous: 1.4 mmol/L (ref 0.5–1.9)
Lactic Acid, Venous: 1.3 mmol/L (ref 0.5–1.9)

## 2022-04-02 LAB — BRAIN NATRIURETIC PEPTIDE: B Natriuretic Peptide: 632.2 pg/mL — ABNORMAL HIGH (ref 0.0–100.0)

## 2022-04-02 LAB — GLUCOSE, CAPILLARY: Glucose-Capillary: 104 mg/dL — ABNORMAL HIGH (ref 70–99)

## 2022-04-02 MED ORDER — VANCOMYCIN HCL 1500 MG/300ML IV SOLN: 1500.0000 mg | INTRAVENOUS | Status: DC

## 2022-04-02 MED ORDER — VANCOMYCIN HCL IN DEXTROSE 1-5 GM/200ML-% IV SOLN
1000.0000 mg | Freq: Once | INTRAVENOUS | Status: AC
  Administered 2022-04-02: 1000 mg via INTRAVENOUS
  Filled 2022-04-02: qty 200

## 2022-04-02 MED ORDER — METRONIDAZOLE 500 MG/100ML IV SOLN
500.0000 mg | Freq: Two times a day (BID) | INTRAVENOUS | Status: DC
  Administered 2022-04-02 – 2022-04-03 (×2): 500 mg via INTRAVENOUS
  Filled 2022-04-02 (×3): qty 100

## 2022-04-02 MED ORDER — HYDROMORPHONE HCL 1 MG/ML IJ SOLN
1.0000 mg | Freq: Once | INTRAMUSCULAR | Status: AC
  Administered 2022-04-02: 1 mg via INTRAVENOUS
  Filled 2022-04-02: qty 1

## 2022-04-02 MED ORDER — ONDANSETRON HCL 4 MG/2ML IJ SOLN: 4.0000 mg | Freq: Four times a day (QID) | INTRAMUSCULAR | Status: DC | PRN

## 2022-04-02 MED ORDER — ACETAMINOPHEN 650 MG RE SUPP: 650.0000 mg | Freq: Four times a day (QID) | RECTAL | Status: DC | PRN

## 2022-04-02 MED ORDER — OXYCODONE-ACETAMINOPHEN 5-325 MG PO TABS
1.0000 | ORAL_TABLET | Freq: Four times a day (QID) | ORAL | Status: DC | PRN
  Administered 2022-04-02 – 2022-04-03 (×2): 1 via ORAL
  Filled 2022-04-02 (×2): qty 1

## 2022-04-02 MED ORDER — ONDANSETRON HCL 4 MG/2ML IJ SOLN
4.0000 mg | Freq: Once | INTRAMUSCULAR | Status: AC
  Administered 2022-04-02: 4 mg via INTRAVENOUS
  Filled 2022-04-02: qty 2

## 2022-04-02 MED ORDER — ALBUTEROL SULFATE (2.5 MG/3ML) 0.083% IN NEBU: 2.5000 mg | INHALATION_SOLUTION | Freq: Four times a day (QID) | RESPIRATORY_TRACT | Status: DC | PRN

## 2022-04-02 MED ORDER — DELFLEX-LC/2.5% DEXTROSE 394 MOSM/L IP SOLN
INTRAPERITONEAL | Status: DC
  Filled 2022-04-02 (×2): qty 3000

## 2022-04-02 MED ORDER — ISOSORBIDE MONONITRATE ER 30 MG PO TB24
30.0000 mg | ORAL_TABLET | Freq: Every day | ORAL | Status: DC
  Administered 2022-04-03: 30 mg via ORAL
  Filled 2022-04-02: qty 1

## 2022-04-02 MED ORDER — HEPARIN SODIUM (PORCINE) 5000 UNIT/ML IJ SOLN
5000.0000 [IU] | Freq: Three times a day (TID) | INTRAMUSCULAR | Status: DC
  Administered 2022-04-02 – 2022-04-03 (×2): 5000 [IU] via SUBCUTANEOUS
  Filled 2022-04-02 (×2): qty 1

## 2022-04-02 MED ORDER — ONDANSETRON HCL 4 MG PO TABS: 4.0000 mg | ORAL_TABLET | Freq: Four times a day (QID) | ORAL | Status: DC | PRN

## 2022-04-02 MED ORDER — INSULIN ASPART 100 UNIT/ML IJ SOLN: 0.0000 [IU] | Freq: Three times a day (TID) | INTRAMUSCULAR | Status: DC

## 2022-04-02 MED ORDER — MORPHINE SULFATE (PF) 4 MG/ML IV SOLN
4.0000 mg | Freq: Once | INTRAVENOUS | Status: AC
  Administered 2022-04-02: 4 mg via INTRAVENOUS
  Filled 2022-04-02: qty 1

## 2022-04-02 MED ORDER — SODIUM CHLORIDE 0.9 % IV BOLUS (SEPSIS)
1000.0000 mL | Freq: Once | INTRAVENOUS | Status: AC
  Administered 2022-04-02: 1000 mL via INTRAVENOUS

## 2022-04-02 MED ORDER — SODIUM CHLORIDE 0.9 % IV SOLN
1.0000 g | INTRAVENOUS | Status: DC
  Administered 2022-04-02: 1 g via INTRAVENOUS
  Filled 2022-04-02: qty 1
  Filled 2022-04-02 (×2): qty 10

## 2022-04-02 MED ORDER — HYDROMORPHONE HCL 1 MG/ML IJ SOLN
1.0000 mg | INTRAMUSCULAR | Status: DC | PRN
  Administered 2022-04-03 (×2): 1 mg via INTRAVENOUS
  Filled 2022-04-02 (×2): qty 1

## 2022-04-02 MED ORDER — VANCOMYCIN HCL IN DEXTROSE 1-5 GM/200ML-% IV SOLN
1000.0000 mg | Freq: Once | INTRAVENOUS | Status: AC
  Administered 2022-04-03: 1000 mg via INTRAVENOUS
  Filled 2022-04-02 (×2): qty 200

## 2022-04-02 MED ORDER — POTASSIUM CHLORIDE CRYS ER 20 MEQ PO TBCR
20.0000 meq | EXTENDED_RELEASE_TABLET | Freq: Two times a day (BID) | ORAL | Status: DC
  Administered 2022-04-02 – 2022-04-03 (×2): 20 meq via ORAL
  Filled 2022-04-02 (×2): qty 1

## 2022-04-02 MED ORDER — ACETAMINOPHEN 325 MG PO TABS: 650.0000 mg | ORAL_TABLET | Freq: Four times a day (QID) | ORAL | Status: DC | PRN

## 2022-04-02 MED ORDER — SODIUM BICARBONATE 650 MG PO TABS
1300.0000 mg | ORAL_TABLET | Freq: Three times a day (TID) | ORAL | Status: DC
  Administered 2022-04-02 – 2022-04-03 (×2): 1300 mg via ORAL
  Filled 2022-04-02 (×2): qty 2

## 2022-04-02 MED ORDER — GENTAMICIN SULFATE 0.1 % EX CREA
1.0000 | TOPICAL_CREAM | Freq: Every day | CUTANEOUS | Status: DC
  Administered 2022-04-03: 1 via TOPICAL
  Filled 2022-04-02: qty 15

## 2022-04-02 MED ORDER — METHOCARBAMOL 500 MG PO TABS
750.0000 mg | ORAL_TABLET | Freq: Three times a day (TID) | ORAL | Status: DC | PRN
  Administered 2022-04-03: 750 mg via ORAL
  Filled 2022-04-02: qty 2

## 2022-04-02 MED ORDER — METOPROLOL SUCCINATE ER 50 MG PO TB24
50.0000 mg | ORAL_TABLET | Freq: Every day | ORAL | Status: DC
  Filled 2022-04-02: qty 1

## 2022-04-02 MED ORDER — PANTOPRAZOLE SODIUM 40 MG PO TBEC
40.0000 mg | DELAYED_RELEASE_TABLET | Freq: Every day | ORAL | Status: DC
  Administered 2022-04-03: 40 mg via ORAL
  Filled 2022-04-02: qty 1

## 2022-04-02 MED ORDER — SERTRALINE HCL 50 MG PO TABS
25.0000 mg | ORAL_TABLET | Freq: Every day | ORAL | Status: DC
  Administered 2022-04-03: 25 mg via ORAL
  Filled 2022-04-02: qty 1

## 2022-04-02 MED ORDER — SODIUM CHLORIDE 0.9 % IV SOLN
2.0000 g | Freq: Once | INTRAVENOUS | Status: AC
  Administered 2022-04-02: 2 g via INTRAVENOUS
  Filled 2022-04-02: qty 20

## 2022-04-02 MED ORDER — FERROUS SULFATE 325 (65 FE) MG PO TABS
325.0000 mg | ORAL_TABLET | Freq: Every day | ORAL | Status: DC
  Administered 2022-04-03: 325 mg via ORAL
  Filled 2022-04-02: qty 1

## 2022-04-02 NOTE — Telephone Encounter (Signed)
Tracy Walsh called from dialysis in [place]   Needs to setup home physical therapy and home nurse, has the ordered filled out from doctor and all information.  Will be faxing forms over to our office.

## 2022-04-02 NOTE — Consult Note (Signed)
CODE SEPSIS - PHARMACY COMMUNICATION  **Broad Spectrum Antibiotics should be administered within 1 hour of Sepsis diagnosis**  Time Code Sepsis Called/Page Received: 1728  Antibiotics Ordered: Ceftriaxone, Vancomycin  Time of 1st antibiotic administration: 1751  Additional action taken by pharmacy: N/A  If necessary, Name of Provider/Nurse Contacted: N/A  Will M. Ouida Sills, PharmD PGY-1 Pharmacy Resident 04/09/2022 5:31 PM

## 2022-04-02 NOTE — Consult Note (Signed)
Pharmacy Antibiotic Note  Tracy Walsh is a 56 y.o. female admitted on 03/23/2022 with  wound infection .  Pharmacy has been consulted for cefepime dosing.  Assessment: 56 yo F with PMH ESRD on peritoneal dialysis, HTN, COPD, CHF presents with LE swelling and severe pain. Pt has chronic ulcers to bilateral heels. Imaging notable for open wound with no bony involvement. Pt is afebrile, VSS, with leukocytosis. Receives PD overnight (4 cycles, 2 hrs each)  Plan: Cefepime 1 g IV q24H  Vancomycin 2g IV total ordered as loading dose, followed by 1500mg  Q24 hours(~18mg /kg)  Follow up culture results to assess for antibiotic optimization Monitor renal function to assess for any necessary antibiotic dosing changes  Height: 5\' 8"  (172.7 cm) Weight: 81.6 kg (180 lb) IBW/kg (Calculated) : 63.9  Temp (24hrs), Avg:97.9 F (36.6 C), Min:97.7 F (36.5 C), Max:98.2 F (36.8 C)  Recent Labs  Lab 03/28/22 1343 03/28/22 1510 03/29/22 0316 03/31/22 1934 03/24/2022 1458 03/29/2022 1750  WBC 28.1*  --  19.9* 24.3* 34.9*  --   CREATININE 10.06*  --  10.32* 10.30* 10.13*  --   LATICACIDVEN 1.4 1.2  --   --  1.4 1.3     Estimated Creatinine Clearance: 7 mL/min (A) (by C-G formula based on SCr of 10.13 mg/dL (H)).    Allergies  Allergen Reactions   Amoxicillin Anaphylaxis   Clarithromycin Anaphylaxis, Diarrhea, Itching, Nausea And Vomiting and Rash   Codeine Itching   Hydrocodone Itching   Moxifloxacin Anaphylaxis, Anxiety, Hives, Itching, Nausea And Vomiting, Palpitations and Shortness Of Breath   Penicillins Anaphylaxis   Sulfa Antibiotics Anaphylaxis   Nitrofurantoin Nausea And Vomiting   Crab (Diagnostic) Hives   Latex Hives, Itching, Rash and Swelling    Antimicrobials this admission: 12/19 Ceftriaxone, Vancomyxin x 1 12/19 Cefepime >>   Dose adjustments this admission: N/A  Microbiology results: 12/19 BCx: sent  Thank you for allowing pharmacy to be a part of this patient's  care.  Will M. Ouida Sills, PharmD PGY-1 Pharmacy Resident 04/12/2022 10:13 PM

## 2022-04-02 NOTE — Consult Note (Signed)
PHARMACY -  BRIEF ANTIBIOTIC NOTE   Pharmacy has received consult(s) for vancomycin from an ED provider.  The patient's profile has been reviewed for ht/wt/allergies/indication/available labs.    One time order(s) placed for vancomycin 1000 mg IV x 1  Further antibiotics/pharmacy consults should be ordered by admitting physician if indicated.                       Thank you, Will M. Ouida Sills, PharmD PGY-1 Pharmacy Resident 03/31/2022 5:35 PM

## 2022-04-02 NOTE — ED Provider Triage Note (Signed)
Emergency Medicine Provider Triage Evaluation Note  Tracy Walsh , a 56 y.o. female  was evaluated in triage.  Pt complains of bilateral lower extremity swelling and redness x1.5 weeks. She was admitted from 12/14-12/15 and received rocephin. No discharge meds on the 15th. Also on oral vanco for cdif. No fever.  Reports that she was too weak to stand today. Had [place] BLE that was negative for DVT.  On PD  Review of Systems  Positive: Weakness, nausea, erythema, pain Negative: fever  Physical Exam  There were no vitals taken for this visit. Gen:   Awake, no distress   Resp:  Normal effort  MSK:   Moves extremities without difficulty  Other:  Significant erythema to bilateral lower extremities, feet wrapped in gauze and unable to visualize in triage.   Medical Decision Making  Medically screening exam initiated at 2:41 PM.  Appropriate orders placed.  Tracy Walsh was informed that the remainder of the evaluation will be completed by another provider, this initial triage assessment does not replace that evaluation, and the importance of remaining in the ED until their evaluation is complete.     Marquette Old, PA-C 04/10/2022 1448

## 2022-04-02 NOTE — Assessment & Plan Note (Signed)
Hypervolemic on examination, however undergoing PD this evening.  If remains volume overloaded tomorrow could consider Lasix.

## 2022-04-02 NOTE — Telephone Encounter (Signed)
Spoke with pt husband and will do phone visit this afternoon

## 2022-04-02 NOTE — H&P (Signed)
History and Physical    Patient: Tracy Walsh TDH:[id number] DOB: 11/27/1965 DOA: 03/25/2022 DOS: the patient was seen and examined on 03/31/2022 PCP: [name], MD  Patient coming from: Home  Chief Complaint:  Chief Complaint  Patient presents with   Cellulitis   HPI: Tracy Walsh is a 56 y.o. female with medical history significant of ESRD on PD, COPD, HFmrEF, CIDP, type 2 diabetes, hypertension, CAD, who presents to the ED with bilateral lower extremity redness.  Ms. Romberger states that she has been experiencing bilateral lower extremity erythema and significant pain for approximately 1 week now.  She states that since symptom onset, it has gradually worsened despite being seen in the ED and being admitted.  She endorses nausea, vomiting and chills since symptoms began but denies any fever.  The pain is so severe she is having difficulty walking.  In addition, she has ulcers on bilateral heels.  She states that the ulcers have been there for at least 1 year but seem to have worsened over the last 1 week.  She denies seeing any purulent drainage.  Per chart review, patient has been experiencing bilateral lower extremity redness with bilateral lower extremity wounds for several weeks now.  She was admitted to [facility] from 12/14 to 12/15 where she received a one-time dose of Dalbavancin.   ED course: On arrival to the ED, patient was normotensive at 105/61 with heart rate of 78.  She was saturating at 99% on room air.  Initial workup remarkable for WBC of 34.9, hemoglobin of 9.4, potassium of 2.9, bicarb of 14, BNP of 632.  Bilateral foot x-ray with open wounds but no evidence of osteomyelitis.  Patient was started on vancomycin and [facility] contacted for admission.  Review of Systems: As mentioned in the history of present illness. All other systems reviewed and are negative.  Past Medical History:  Diagnosis Date   Anemia    Anxiety    Asthma    Bipolar disorder ([place]) 2018    type 2   CHF (congestive heart failure) (HCC)    CIDP (chronic inflammatory demyelinating polyneuropathy) (HCC)    CKD (chronic kidney disease)    COPD (chronic obstructive pulmonary disease) ([place])    Depression    Depression with anxiety 12/16/2021   Depression, recurrent ([place]) 12/07/2021   Diabetes mellitus without complication (HCC)    type 2   Diabetic foot ulcers (HCC)    Fibromyalgia    GERD (gastroesophageal reflux disease)    Hepatitis    Hep as a child - patient can give blood   History of blood transfusion 08/2021   1 unit per patient   History of esophagogastroduodenoscopy (EGD) 2018   History of kidney stones    passed stones   HLD (hyperlipidemia)    Hypertension    Migraines    Myocardial infarction (HCC)    x 2   Neuropathy    legs and feet - bilateral   Sleep apnea 01/25/2022   Past Surgical History:  Procedure Laterality Date   APPENDECTOMY     AV FISTULA PLACEMENT Right 01/15/2022   Procedure: RIGHT ARM ARTERIOVENOUS (AV) FISTULA CREATION;  Surgeon: Rosetta Posner, MD;  Location: [facility];  Service: Vascular;  Laterality: Right;   [place] Right 03/05/2022   Procedure: RIGHT ARM SECOND STAGE BASILIC VEIN TRANSPOSITION;  Surgeon: Rosetta Posner, MD;  Location: [facility];  Service: Vascular;  Laterality: Right;   BILATERAL NASAL FRACTURE CLOSED REDUCTION  10/2020  CAPD INSERTION N/A 02/01/2022   Procedure: LAPAROSCOPIC INSERTION CONTINUOUS AMBULATORY PERITONEAL DIALYSIS  (CAPD) CATHETER;  Surgeon: Jules Husbands, MD;  Location: [facility];  Service: General;  Laterality: N/A;   CHOLECYSTECTOMY     COLONOSCOPY     CORONARY ANGIOPLASTY WITH STENT PLACEMENT  01/15/2021   FOOT SURGERY Right    I & D   I & D EXTREMITY Left 09/28/2021   Procedure: LEFT HEEL DEBRIDEMENT AND TISSUE GRAFT;  Surgeon: Newt Minion, MD;  Location: [place];  Service: Orthopedics;  Laterality: Left;   IR FLUORO GUIDE CV LINE RIGHT  12/20/2021   IR US GUIDE VASC ACCESS RIGHT   12/20/2021   REMOVAL OF A DIALYSIS CATHETER N/A 03/05/2022   Procedure: REMOVAL OF A TUNNELED DIALYSIS CATHETER;  Surgeon: Rosetta Posner, MD;  Location: [facility];  Service: Vascular;  Laterality: N/A;   SHOULDER SURGERY Bilateral    SPINAL FUSION     TOTAL ABDOMINAL HYSTERECTOMY  1995   UPPER GI ENDOSCOPY     Social History:  reports that she has been smoking cigarettes. She has been smoking an average of .5 packs per day. She has never used smokeless tobacco. She reports that she does not drink alcohol and does not use drugs.  Allergies  Allergen Reactions   Amoxicillin Anaphylaxis   Clarithromycin Anaphylaxis, Diarrhea, Itching, Nausea And Vomiting and Rash   Codeine Itching   Hydrocodone Itching   Moxifloxacin Anaphylaxis, Anxiety, Hives, Itching, Nausea And Vomiting, Palpitations and Shortness Of Breath   Penicillins Anaphylaxis   Sulfa Antibiotics Anaphylaxis   Nitrofurantoin Nausea And Vomiting   Crab (Diagnostic) Hives   Latex Hives, Itching, Rash and Swelling    History reviewed. No pertinent family history.  Prior to Admission medications   Medication Sig Start Date End Date Taking? Authorizing Provider  albuterol (PROVENTIL) (2.5 MG/3ML) 0.083% nebulizer solution Take 3 mLs (2.5 mg total) by nebulization every 6 (six) hours as needed for wheezing or shortness of breath. 12/28/21   [name], MD  blood glucose meter kit and supplies Dispense based on patient and insurance preference. Use up to four times daily as directed. (FOR ICD-10 E10.9, E11.9). 01/25/22   [name], MD  calcium carbonate (TUMS - DOSED IN MG ELEMENTAL CALCIUM) 500 MG chewable tablet Chew 1 tablet (200 mg of elemental calcium total) by mouth daily. 03/30/22 04/29/22  Manuella Ghazi, Pratik D, DO  cyanocobalamin (,VITAMIN B-12,) 1000 MCG/ML injection Inject 1,000 mcg into the muscle every 30 (thirty) days. 09/17/21   [provider]  dicyclomine (BENTYL) 10 MG capsule TAKE 1 CAPSULE (10 MG TOTAL) BY  MOUTH 2 (TWO) TIMES DAILY AS NEEDED FOR SPASMS. 01/24/22   Carlan, Deatra Robinson, NP  ferrous sulfate 325 (65 FE) MG EC tablet Take 325 mg by mouth daily with breakfast. 02/19/21   [provider]  furosemide (LASIX) 80 MG tablet Please take 80 mg of Lasix on none HD days on Sunday, Monday, Wednesday and Friday Patient not taking: Reported on 03/21/2022 12/22/21   Elgergawy, Silver Huguenin, MD  gabapentin (NEURONTIN) 100 MG capsule Take 2 capsules (200 mg total) by mouth at bedtime. Patient taking differently: Take 200 mg by mouth 2 (two) times daily. 12/22/21 03/28/22  Elgergawy, Silver Huguenin, MD  GVOKE HYPOPEN 1-PACK 1 MG/0.2ML SOAJ INJECT 1 MG INTO THE SKIN AS NEEDED (SEVERE HYPOGLYCEMIA). Patient not taking: Reported on 03/28/2022 01/25/22   [name], MD  isosorbide mononitrate (IMDUR) 60 MG 24 hr tablet  Take 1 tablet (60 mg total) by mouth daily. Patient taking differently: Take 30 mg by mouth daily. 02/27/22 05/28/22  [name], MD  loperamide (IMODIUM) 2 MG capsule Take 2 mg by mouth as needed for diarrhea or loose stools.    [provider]  Melatonin 10 MG CAPS Take 1 capsule by mouth daily.    [provider]  metoprolol succinate (TOPROL-XL) 50 MG 24 hr tablet TAKE 1 TABLET BY MOUTH EVERY DAY 02/28/22   [name], MD  mupirocin ointment (BACTROBAN) 2 % Apply 1 Application topically daily. 10/31/21   Suzan Slick, NP  omeprazole (PRILOSEC) 20 MG capsule Take 20 mg by mouth daily.    [provider]  ondansetron (ZOFRAN) 4 MG tablet Take 1 tablet (4 mg total) by mouth daily as needed for nausea or vomiting. 12/22/21 12/22/22  Elgergawy, Silver Huguenin, MD  oxyCODONE-acetaminophen (PERCOCET) 5-325 MG tablet Take 1 tablet by mouth every 6 (six) hours as needed for severe pain. 03/29/22   Manuella Ghazi, Pratik D, DO  potassium chloride SA (KLOR-CON M) 20 MEQ tablet Take 20 mEq by mouth 2 (two) times daily. 02/25/22   [provider]  Probiotic Product (PROBIOTIC  GUMMIES PO) Take 1 tablet by mouth every evening.    [provider]  promethazine (PHENERGAN) 12.5 MG tablet Take 12.5 mg by mouth every 6 (six) hours as needed. 03/12/22   [provider]  sertraline (ZOLOFT) 50 MG tablet TAKE HALF A TABLET (25MG) ONCE DAILY. Patient taking differently: Take 25 mg by mouth daily. Take half a tablet (83m) once daily. 02/20/22   SJacquelynn Cree MD  sevelamer carbonate (RENVELA) 800 MG tablet Take 1 tablet (800 mg total) by mouth 3 (three) times daily with meals. 12/22/21   Elgergawy, DSilver Huguenin MD  sodium bicarbonate 650 MG tablet Take 2 tablets (1,300 mg total) by mouth 3 (three) times daily. 03/29/22 04/28/22  SManuella Ghazi Pratik D, DO  vancomycin (VANCOCIN) 125 MG capsule TAKE 1 CAPSULE (125 MG TOTAL) BY MOUTH 4 (FOUR) TIMES DAILY FOR 14 DAYS, THEN 1 CAPSULE (125 MG TOTAL) 2 (TWO) TIMES DAILY FOR 7 DAYS, THEN 1 CAPSULE (125 MG TOTAL) DAILY FOR 7 DAYS, THEN 1 CAPSULE (125 MG TOTAL) EVERY OTHER DAY FOR 28 DAYS. 03/04/22 04/29/22  CHarvel Quale MD    Physical Exam: Vitals:   04/09/2022 1930 03/28/2022 2000 04/11/2022 2010 03/24/2022 2043  BP:   108/60 (!) 106/55  Pulse: 73 72  77  Resp: _0 Temp:   98.2 F (36.8 C) 97.7 F (36.5 C)  TempSrc:   Oral Oral  SpO2: 99% 99%  97%  Weight:      Height:       Physical Exam Vitals and nursing note reviewed.  Constitutional:      General: She is not in acute distress.    Appearance: She is normal weight. She is not toxic-appearing.  HENT:     Head: Normocephalic and atraumatic.     Mouth/Throat:     Mouth: Mucous membranes are moist.     Pharynx: Oropharynx is clear.  Eyes:     Conjunctiva/sclera: Conjunctivae normal.     Pupils: Pupils are equal, round, and reactive to light.  Cardiovascular:     Rate and Rhythm: Normal rate and regular rhythm.     Heart sounds: No murmur heard.    No gallop.  Pulmonary:     Effort: Pulmonary effort is normal. No respiratory  distress.     Breath  sounds: Normal breath sounds. No wheezing, rhonchi or rales.  Abdominal:     General: Bowel sounds are normal. There is no distension.     Palpations: Abdomen is soft.     Tenderness: There is no abdominal tenderness. There is no guarding.  Musculoskeletal:     Right lower leg: 2+ Pitting Edema present.     Left lower leg: 2+ Pitting Edema present.  Skin:    General: Skin is warm.     Findings: Erythema (Bilateral lower remedy edema overlying the shins, right significantly worse than the left.  Creased warmth touch.) and wound present.     Comments: Bilateral heel wounds present. Right heel wound significantly worse than left approximately 4 to 5 cm lengthwise with fluctuance on palpation concerning for underlying fluid collection. Surrounding ulcer, skin appears pale. Please see pictures below for more details.  Neurological:     General: No focal deficit present.     Mental Status: She is alert and oriented to person, place, and time.  Psychiatric:        Mood and Affect: Mood normal.        Behavior: Behavior normal.       Left heel ^    Right Heel ^  Data Reviewed: CBC with WBC of 34.9 with hemoglobin of 9.4 and platelets of 339. BMP with sodium of 133, potassium 2.9, bicarb 14, glucose of 273, BUN of 58, creatinine of 10, calcium of 7.3 and anion gap of 21. BNP elevated at 632. Lactic acid within normal limits x 2  EKG personally reviewed.  Sinus rhythm with rate of 76.  No ST or T wave changes changes concerning for acute ischemia  DG Foot Complete Left  Result Date: 04/01/2022 CLINICAL DATA:  Diabetic wound. EXAM: LEFT FOOT - COMPLETE 3 VIEW; RIGHT FOOT COMPLETE - 3 VIEW COMPARISON:  None Available. FINDINGS: No acute fracture, dislocation or subluxation. No osteolytic or osteoblastic changes. No bony destructive process. Soft tissue air consistent with open noted posterior to the left calcaneus. There are posterior and plantar calcaneal spurs bilaterally. Extensive  vascular calcifications identified. IMPRESSION: Open wound on the left posterior to the calcaneus. No acute osseous abnormalities or bony destructive process. Electronically Signed   By: Sammie Bench M.D.   On: 03/15/2022 15:38   DG Foot Complete Right  Result Date: 04/08/2022 CLINICAL DATA:  Diabetic wound. EXAM: LEFT FOOT - COMPLETE 3 VIEW; RIGHT FOOT COMPLETE - 3 VIEW COMPARISON:  None Available. FINDINGS: No acute fracture, dislocation or subluxation. No osteolytic or osteoblastic changes. No bony destructive process. Soft tissue air consistent with open noted posterior to the left calcaneus. There are posterior and plantar calcaneal spurs bilaterally. Extensive vascular calcifications identified. IMPRESSION: Open wound on the left posterior to the calcaneus. No acute osseous abnormalities or bony destructive process. Electronically Signed   By: Sammie Bench M.D.   On: 03/22/2022 15:38    Results are pending, will review when available.  Assessment and Plan: * Cellulitis Patient presenting with bilateral lower extremity erythema that has progressively worsened despite treatment with IV Dalbavancin.  Her right lower extremity significantly worse than the left.  Given lack of improvement and diabetic foot wound present on the right heel, will broaden antibiotic coverage to include anaerobic and Pseudomonas.  - Continue vancomycin per pharmacy dosing - Start cefepime per pharmacy dosing - Flagyl 500 mg twice daily  Diabetic foot ulcer ([place]) On examination, patient has a rather  large foot ulcer on her right heel that has been chronic but appears significantly worsened compared to prior.  Given that it has enlarged and rather quickly in the last week, will obtain an MRI to rule out osteomyelitis.  X-ray was negative.  - MRI right heel pending - CRP pending - Broad-spectrum antimicrobial coverage as noted above: Vancomycin, cefepime Flagyl  ESRD on peritoneal dialysis [facility]) -  Nephrology consulted; appreciate their recommendations - PD ordered for tonight  Hypokalemia - Restart home potassium supplementation.  Recurrent Clostridioides difficile diarrhea Patient states she has a 2-year history of recurrent C. difficile that has been resistant to treatment.  She is planning to undergo fecal transplant in January 2024.  -Continue oral vancomycin  Chronic combined systolic and diastolic CHF (congestive heart failure) (HCC) Hypervolemic on examination, however undergoing PD this evening.  If remains volume overloaded tomorrow could consider Lasix.  Uncontrolled type 2 diabetes mellitus with hyperglycemia, without long-term current use of insulin (HCC) - SSI, sensitive  Advance Care Planning:   Code Status: Full Code. Confirmed with patient.  Consults: None  Family Communication: Patient's husband updated at bedside  Severity of Illness: The appropriate patient status for this patient is OBSERVATION. Observation status is judged to be reasonable and necessary in order to provide the required intensity of service to ensure the patient's safety. The patient's presenting symptoms, physical exam findings, and initial radiographic and laboratory data in the context of their medical condition is felt to place them at decreased risk for further clinical deterioration. Furthermore, it is anticipated that the patient will be medically stable for discharge from the hospital within 2 midnights of admission.   Author: Jose Persia, MD 03/16/2022 10:36 PM  For on call review [email or web address].

## 2022-04-02 NOTE — Assessment & Plan Note (Signed)
Patient presenting with bilateral lower extremity erythema that has progressively worsened despite treatment with IV Dalbavancin.  Her right lower extremity significantly worse than the left.  Given lack of improvement and diabetic foot wound present on the right heel, will broaden antibiotic coverage to include anaerobic and Pseudomonas.  - Continue vancomycin per pharmacy dosing - Start cefepime per pharmacy dosing - Flagyl 500 mg twice daily

## 2022-04-02 NOTE — Assessment & Plan Note (Signed)
-   Nephrology consulted; appreciate their recommendations - PD ordered for tonight

## 2022-04-02 NOTE — ED Triage Notes (Addendum)
Patient has bilateral swelling and redness to bilateral lower legs. Has been in and out of the hospital at [facility] for the past couple weeks and not getting better. Patient currently being treated for Cdiff.  Reports CHF ESRD on PD,  Patient reports she got up this am and couldn't not use her legs.  Patient complains of neck pain back pain bilateral leg pain and n/v/d,  Currently taking oral vanc for c diff. Patient bilateral diabetic foot ulcers to bottom of feet that are currently wrapped.

## 2022-04-02 NOTE — Consult Note (Signed)
Pharmacy Antibiotic Note  Tracy Walsh is a 56 y.o. female admitted on 04/05/2022 with  wound infection .  Pharmacy has been consulted for cefepime dosing.  Assessment: 56 yo F with PMH ESRD on peritoneal dialysis, HTN, COPD, CHF presents with LE swelling and severe pain. Pt has chronic ulcers to bilateral heels. Imaging notable for open wound with no bony involvement. Pt is afebrile, VSS, with leukocytosis. Receives PD overnight (4 cycles, 2 hrs each)  Plan: Initiate cefepime 1 g IV q24H Follow up culture results to assess for antibiotic optimization Monitor renal function to assess for any necessary antibiotic dosing changes  Height: 5\' 8"  (172.7 cm) Weight: 81.6 kg (180 lb) IBW/kg (Calculated) : 63.9  Temp (24hrs), Avg:98 F (36.7 C), Min:97.8 F (36.6 C), Max:98.2 F (36.8 C)  Recent Labs  Lab 03/28/22 1343 03/28/22 1510 03/29/22 0316 03/31/22 1934 03/16/2022 1458 04/08/2022 1750  WBC 28.1*  --  19.9* 24.3* 34.9*  --   CREATININE 10.06*  --  10.32* 10.30* 10.13*  --   LATICACIDVEN 1.4 1.2  --   --  1.4 1.3    Estimated Creatinine Clearance: 7 mL/min (A) (by C-G formula based on SCr of 10.13 mg/dL (H)).    Allergies  Allergen Reactions   Amoxicillin Anaphylaxis   Clarithromycin Anaphylaxis, Diarrhea, Itching, Nausea And Vomiting and Rash   Codeine Itching   Hydrocodone Itching   Moxifloxacin Anaphylaxis, Anxiety, Hives, Itching, Nausea And Vomiting, Palpitations and Shortness Of Breath   Penicillins Anaphylaxis   Sulfa Antibiotics Anaphylaxis   Nitrofurantoin Nausea And Vomiting   Crab (Diagnostic) Hives   Latex Hives, Itching, Rash and Swelling    Antimicrobials this admission: 12/19 Ceftriaxone, Vancomyxin x 1 12/19 Cefepime >>   Dose adjustments this admission: N/A  Microbiology results: 12/19 BCx: sent  Thank you for allowing pharmacy to be a part of this patient's care.  Will M. Ouida Sills, PharmD PGY-1 Pharmacy Resident 04/05/2022 8:30 PM

## 2022-04-02 NOTE — Assessment & Plan Note (Signed)
-   SSI, sensitive 

## 2022-04-02 NOTE — Assessment & Plan Note (Signed)
Patient states she has a 2-year history of recurrent C. difficile that has been resistant to treatment.  She is planning to undergo fecal transplant in January 2024.  -Continue oral vancomycin

## 2022-04-02 NOTE — Telephone Encounter (Signed)
Ms. Grenier contacted our office today to discuss her current symptoms and concerns.  She was recently discharged from [facility] on 12/15 after presenting with bilateral lower extremity pain and swelling.  She was admitted for bilateral lower extremity cellulitis.  Admitted 12/14 and discharged 12/15.  No antibiotics provided at discharge as she received dalbavancin in the emergency department.  A referral to ID was placed upon discharge.  She returned to the emergency department on 12/17 endorsing chest pain and weakness.  Cardiac workup was not concerning and she was discharged home.  Today she endorses significant bilateral lower extremity pain as well as weakness.  She has been hypotensive on home blood pressure readings.  On review of labs obtained at 12/17, her leukocytosis has worsened.  She is unable to stand without assistance due to weakness.  Given her current symptoms and the available objective information, I have recommended that Ms. Harnden present to the emergency department for evaluation and possible readmission.  She is in agreement with this plan.

## 2022-04-02 NOTE — Assessment & Plan Note (Signed)
On examination, patient has a rather large foot ulcer on her right heel that has been chronic but appears significantly worsened compared to prior.  Given that it has enlarged and rather quickly in the last week, will obtain an MRI to rule out osteomyelitis.  X-ray was negative.  - MRI right heel pending - CRP pending - Broad-spectrum antimicrobial coverage as noted above: Vancomycin, cefepime Flagyl

## 2022-04-02 NOTE — Assessment & Plan Note (Signed)
-   Restart home potassium supplementation.

## 2022-04-03 ENCOUNTER — Observation Stay: Payer: 59

## 2022-04-03 ENCOUNTER — Other Ambulatory Visit: Payer: 59

## 2022-04-03 ENCOUNTER — Encounter: Payer: 59 | Admitting: Vascular Surgery

## 2022-04-03 DIAGNOSIS — N2581 Secondary hyperparathyroidism of renal origin: Secondary | ICD-10-CM | POA: Diagnosis not present

## 2022-04-03 DIAGNOSIS — I2510 Atherosclerotic heart disease of native coronary artery without angina pectoris: Secondary | ICD-10-CM | POA: Diagnosis not present

## 2022-04-03 DIAGNOSIS — E1122 Type 2 diabetes mellitus with diabetic chronic kidney disease: Secondary | ICD-10-CM | POA: Diagnosis not present

## 2022-04-03 DIAGNOSIS — L97409 Non-pressure chronic ulcer of unspecified heel and midfoot with unspecified severity: Secondary | ICD-10-CM | POA: Diagnosis not present

## 2022-04-03 DIAGNOSIS — E1165 Type 2 diabetes mellitus with hyperglycemia: Secondary | ICD-10-CM | POA: Diagnosis not present

## 2022-04-03 DIAGNOSIS — L97429 Non-pressure chronic ulcer of left heel and midfoot with unspecified severity: Secondary | ICD-10-CM | POA: Diagnosis not present

## 2022-04-03 DIAGNOSIS — L03119 Cellulitis of unspecified part of limb: Secondary | ICD-10-CM | POA: Diagnosis not present

## 2022-04-03 DIAGNOSIS — D631 Anemia in chronic kidney disease: Secondary | ICD-10-CM | POA: Diagnosis not present

## 2022-04-03 DIAGNOSIS — E11621 Type 2 diabetes mellitus with foot ulcer: Secondary | ICD-10-CM | POA: Diagnosis not present

## 2022-04-03 DIAGNOSIS — I132 Hypertensive heart and chronic kidney disease with heart failure and with stage 5 chronic kidney disease, or end stage renal disease: Secondary | ICD-10-CM | POA: Diagnosis not present

## 2022-04-03 DIAGNOSIS — M797 Fibromyalgia: Secondary | ICD-10-CM | POA: Diagnosis not present

## 2022-04-03 DIAGNOSIS — L03115 Cellulitis of right lower limb: Secondary | ICD-10-CM | POA: Diagnosis not present

## 2022-04-03 DIAGNOSIS — D72829 Elevated white blood cell count, unspecified: Secondary | ICD-10-CM | POA: Diagnosis not present

## 2022-04-03 DIAGNOSIS — F17210 Nicotine dependence, cigarettes, uncomplicated: Secondary | ICD-10-CM | POA: Diagnosis not present

## 2022-04-03 DIAGNOSIS — I5042 Chronic combined systolic (congestive) and diastolic (congestive) heart failure: Secondary | ICD-10-CM | POA: Diagnosis not present

## 2022-04-03 DIAGNOSIS — L03116 Cellulitis of left lower limb: Secondary | ICD-10-CM | POA: Diagnosis not present

## 2022-04-03 DIAGNOSIS — F3181 Bipolar II disorder: Secondary | ICD-10-CM | POA: Diagnosis not present

## 2022-04-03 DIAGNOSIS — E1140 Type 2 diabetes mellitus with diabetic neuropathy, unspecified: Secondary | ICD-10-CM | POA: Diagnosis not present

## 2022-04-03 DIAGNOSIS — N186 End stage renal disease: Secondary | ICD-10-CM | POA: Diagnosis not present

## 2022-04-03 DIAGNOSIS — L97419 Non-pressure chronic ulcer of right heel and midfoot with unspecified severity: Secondary | ICD-10-CM | POA: Diagnosis not present

## 2022-04-03 DIAGNOSIS — E876 Hypokalemia: Secondary | ICD-10-CM | POA: Diagnosis not present

## 2022-04-03 DIAGNOSIS — Z992 Dependence on renal dialysis: Secondary | ICD-10-CM | POA: Diagnosis not present

## 2022-04-03 DIAGNOSIS — G6181 Chronic inflammatory demyelinating polyneuritis: Secondary | ICD-10-CM | POA: Diagnosis not present

## 2022-04-03 DIAGNOSIS — A0471 Enterocolitis due to Clostridium difficile, recurrent: Secondary | ICD-10-CM | POA: Diagnosis not present

## 2022-04-03 DIAGNOSIS — E785 Hyperlipidemia, unspecified: Secondary | ICD-10-CM | POA: Diagnosis not present

## 2022-04-03 LAB — CBC
HCT: 21.3 % — ABNORMAL LOW (ref 36.0–46.0)
Hemoglobin: 7.3 g/dL — ABNORMAL LOW (ref 12.0–15.0)
MCH: 28 pg (ref 26.0–34.0)
MCHC: 34.3 g/dL (ref 30.0–36.0)
MCV: 81.6 fL (ref 80.0–100.0)
Platelets: 258 10*3/uL (ref 150–400)
RBC: 2.61 MIL/uL — ABNORMAL LOW (ref 3.87–5.11)
RDW: 16.9 % — ABNORMAL HIGH (ref 11.5–15.5)
WBC: 23.6 10*3/uL — ABNORMAL HIGH (ref 4.0–10.5)
nRBC: 0.1 % (ref 0.0–0.2)

## 2022-04-03 LAB — BASIC METABOLIC PANEL
Anion gap: 18 — ABNORMAL HIGH (ref 5–15)
BUN: 55 mg/dL — ABNORMAL HIGH (ref 6–20)
CO2: 13 mmol/L — ABNORMAL LOW (ref 22–32)
Calcium: 6.6 mg/dL — ABNORMAL LOW (ref 8.9–10.3)
Chloride: 102 mmol/L (ref 98–111)
Creatinine, Ser: 9.93 mg/dL — ABNORMAL HIGH (ref 0.44–1.00)
GFR, Estimated: 4 mL/min — ABNORMAL LOW (ref >60)
Glucose, Bld: 137 mg/dL — ABNORMAL HIGH (ref 70–99)
Potassium: 3 mmol/L — ABNORMAL LOW (ref 3.5–5.1)
Sodium: 133 mmol/L — ABNORMAL LOW (ref 135–145)

## 2022-04-03 LAB — C-REACTIVE PROTEIN: CRP: 27.7 mg/dL — ABNORMAL HIGH (ref <1.0)

## 2022-04-03 LAB — GLUCOSE, CAPILLARY
Glucose-Capillary: 118 mg/dL — ABNORMAL HIGH (ref 70–99)
Glucose-Capillary: 108 mg/dL — ABNORMAL HIGH (ref 70–99)

## 2022-04-03 LAB — HEPATITIS B SURFACE ANTIGEN: Hepatitis B Surface Ag: NONREACTIVE

## 2022-04-03 LAB — SEDIMENTATION RATE: Sed Rate: >140 mm/hr — ABNORMAL HIGH (ref 0–22)

## 2022-04-03 MED ORDER — VANCOMYCIN HCL 125 MG PO CAPS
125.0000 mg | ORAL_CAPSULE | Freq: Two times a day (BID) | ORAL | Status: DC
  Filled 2022-04-03 (×2): qty 1

## 2022-04-03 MED ORDER — ALPRAZOLAM 0.5 MG PO TABS
0.5000 mg | ORAL_TABLET | Freq: Once | ORAL | Status: AC
  Administered 2022-04-03: 0.5 mg via ORAL
  Filled 2022-04-03: qty 1

## 2022-04-03 MED ORDER — VANCOMYCIN VARIABLE DOSE PER UNSTABLE RENAL FUNCTION (PHARMACIST DOSING): Status: DC

## 2022-04-04 LAB — HEPATITIS B SURFACE ANTIBODY, QUANTITATIVE: Hep B S AB Quant (Post): <3.1 m[IU]/mL — ABNORMAL LOW (ref >9.9)

## 2022-04-04 LAB — HEPATITIS B E ANTIBODY: Hep B E Ab: NEGATIVE

## 2022-04-07 LAB — CULTURE, BLOOD (ROUTINE X 2)
Culture: NO GROWTH
Culture: NO GROWTH
Special Requests: ADEQUATE
Special Requests: ADEQUATE

## 2022-04-09 ENCOUNTER — Ambulatory Visit (HOSPITAL_COMMUNITY): Payer: 59 | Admitting: Psychiatry

## 2022-04-09 ENCOUNTER — Encounter (HOSPITAL_COMMUNITY): Payer: Self-pay

## 2022-04-15 NOTE — Progress Notes (Signed)
       CROSS COVER NOTE  NAME: Zineb Glade MRN: [id number] DOB : 05/11/1965 ATTENDING PHYSICIAN: Jose Persia, MD    Date of Service   Apr 28, 2022   HPI/Events of Note   Medication request received for patient anxiety related to having MRI of (R) foot/heel.  Interventions   Assessment/Plan:  Xanax x1        To reach the provider On-Call:   7AM- 7PM see care teams to locate the attending and reach out to them via [email or web address]. 7PM-7AM contact night-coverage If you still have difficulty reaching the appropriate provider, please page the [facility] (Director on Call) for [facility] on amion for assistance  This document was prepared using Set designer software and may include unintentional dictation errors.  Neomia Glass DNP, MBA, FNP-BC Nurse Practitioner [facility] Pager [phone]

## 2022-04-15 NOTE — Progress Notes (Signed)
04/04/22 1653: Called Mr. Tracy Walsh. Explained we had arranged for his wife to have an autopsy at the hospital expense. Told him I would contact him tomorrow about the dates and she would return to [facility] until the arrangements are made for cremation.He stated he had spoken with the same MD that was there yesterday when his wife died. He did not seem satisfied after the discussion. I told him I would ask another MD to talk with him but it would not be today, and I was not sure when it would be but I would ask someone to contact him.He again mentioned she should have been on a heart monitor and stated "well this is a major screw up and it needs to be explained, she was on a heart monitor in the ED, and even though she wasn't have heart issues to come in, she did have serious heart issues, and I am going to want a copy of her medical records." I did say he would need to sign some documents for the autopsy and I would call him back tomorrow. He said he would be in [place] and could swing by the hospital. Encouraged him to call the [facility] if he had other questions, concerns about the autopsy or questions. I also reassured him the people doing the autopsy were not affiliated with [facility].

## 2022-04-15 NOTE — Hospital Course (Addendum)
Taken from H&P.  Tracy Walsh is a 57 y.o. female with medical history significant of ESRD on PD, COPD, HFmrEF, CIDP, type 2 diabetes, hypertension, CAD, who presents to the ED with bilateral lower extremity redness with associated pain for 1 week.  She also endorses nausea, vomiting and chills but denies any fever.  Also has ulcers on bilateral heels, there for at least 1 year but have worsened over the past 1 week.  She denies seeing any purulent drainage. Patient was recently admitted to [facility] from 12/14 dose 12/15 where she received one-time dose of dalbavancin.  ED course: On arrival to the ED, patient was normotensive at 105/61 with heart rate of 78.  She was saturating at 99% on room air.  Initial workup remarkable for WBC of 34.9, hemoglobin of 9.4, potassium of 2.9, bicarb of 14, BNP of 632.  Bilateral foot x-ray with open wounds but no evidence of osteomyelitis.  She was started on broad-spectrum antibiotic.  12/20: Vitals stable, WBC improved to 23.6 with decrease of hemoglobin to 7.3.  No active bleeding and all cell lines decreased. sodium stable at 133, potassium at 3, bicarb 13 and renal function consistent with ESRD on peritoneal dialysis. Podiatry was consulted. MRI of right heel with Posterior heel soft tissue ulcer with underlying minimal patchy subcortical marrow edema in the posterior calcaneus. This could reflect reactive marrow change or early osteomyelitis. No evidence of soft tissue abscess.  Mild tenosynovitis of the peroneal tendons, posterior tibial tendon, and extensor digitorum tendons.  Patient was somnolent during morning rounds.  Easily arousable.  Stating lower extremity pain and would like to get some sleep as did not slept well last night.  She was lying flat with no evidence of any distress.  Around 2:15 PM, husband at bedside noticed that she is unresponsive so called the nursing staff, she was without any pulse or breathing so a CODE BLUE was called.   She was found to be in asystole.  When I saw her again code was already in place. Per nursing staff 15 minutes ago she was seen with stable vitals and offered water which she drank some.  No concern of any distress at that time either.  She went back to sleep after drinking some water.   Code was run her ACLS protocol for more than 35-minute, she was intubated.  Never regain pulse or breathing.  Pupils dilated and fixed.  She was pronounced dead at 3:05 PM.

## 2022-04-15 NOTE — Progress Notes (Signed)
Patient alert and oriented but resting at this time, and easy to arouse. Patient asked RN for water and returned back to sleep. No complaints or concerns at this time.

## 2022-04-15 NOTE — ED Provider Notes (Signed)
[facility] Department of Emergency Medicine   Code Blue CONSULT NOTE  Chief Complaint: Cardiac arrest/unresponsive   Level V Caveat: Unresponsive  History of present illness: I was contacted by the hospital for a CODE BLUE cardiac arrest upstairs and presented to the patient's bedside.  Patient mended to the hospitalist for cellulitis.  On peritoneal dialysis.  Complaining of being tired earlier today.  Initial rhythm PEA.  Potassium earlier today was 3.0.  ROS: Unable to obtain, Level V caveat  Scheduled Meds:  ferrous sulfate  325 mg Oral Q breakfast   gentamicin cream  1 Application Topical Daily   heparin  5,000 Units Subcutaneous Q8H   insulin aspart  0-9 Units Subcutaneous TID WC   isosorbide mononitrate  30 mg Oral Daily   metoprolol succinate  50 mg Oral Daily   pantoprazole  40 mg Oral Daily   potassium chloride SA  20 mEq Oral BID   sertraline  25 mg Oral Daily   sodium bicarbonate  1,300 mg Oral TID   vancomycin  125 mg Oral BID   vancomycin variable dose per unstable renal function (pharmacist dosing)   Does not apply See admin instructions   Continuous Infusions:  ceFEPime (MAXIPIME) IV 1 g (03/27/2022 2314)   dialysis solution 2.5% low-MG/low-CA     metronidazole 500 mg (2022/04/15 0807)   PRN Meds:.acetaminophen **OR** acetaminophen, albuterol, HYDROmorphone (DILAUDID) injection, methocarbamol, ondansetron **OR** ondansetron (ZOFRAN) IV, oxyCODONE-acetaminophen Past Medical History:  Diagnosis Date   Anemia    Anxiety    Asthma    Bipolar disorder ([place]) 2018   type 2   CHF (congestive heart failure) (HCC)    CIDP (chronic inflammatory demyelinating polyneuropathy) (HCC)    CKD (chronic kidney disease)    COPD (chronic obstructive pulmonary disease) ([place])    Depression    Depression with anxiety 12/16/2021   Depression, recurrent ([place]) 12/07/2021   Diabetes mellitus without complication (HCC)    type 2   Diabetic foot ulcers (HCC)     Fibromyalgia    GERD (gastroesophageal reflux disease)    Hepatitis    Hep as a child - patient can give blood   History of blood transfusion 08/2021   1 unit per patient   History of esophagogastroduodenoscopy (EGD) 2018   History of kidney stones    passed stones   HLD (hyperlipidemia)    Hypertension    Migraines    Myocardial infarction (HCC)    x 2   Neuropathy    legs and feet - bilateral   Sleep apnea 01/25/2022   Past Surgical History:  Procedure Laterality Date   APPENDECTOMY     AV FISTULA PLACEMENT Right 01/15/2022   Procedure: RIGHT ARM ARTERIOVENOUS (AV) FISTULA CREATION;  Surgeon: Rosetta Posner, MD;  Location: [facility];  Service: Vascular;  Laterality: Right;   [place] Right 03/05/2022   Procedure: RIGHT ARM SECOND STAGE BASILIC VEIN TRANSPOSITION;  Surgeon: Rosetta Posner, MD;  Location: [facility];  Service: Vascular;  Laterality: Right;   BILATERAL NASAL FRACTURE CLOSED REDUCTION  10/2020   CAPD INSERTION N/A 02/01/2022   Procedure: LAPAROSCOPIC INSERTION CONTINUOUS AMBULATORY PERITONEAL DIALYSIS  (CAPD) CATHETER;  Surgeon: Jules Husbands, MD;  Location: [facility];  Service: General;  Laterality: N/A;   CHOLECYSTECTOMY     COLONOSCOPY     CORONARY ANGIOPLASTY WITH STENT PLACEMENT  01/15/2021   FOOT SURGERY Right    I & D   I & D EXTREMITY  Left 09/28/2021   Procedure: LEFT HEEL DEBRIDEMENT AND TISSUE GRAFT;  Surgeon: Newt Minion, MD;  Location: [place];  Service: Orthopedics;  Laterality: Left;   IR FLUORO GUIDE CV LINE RIGHT  12/20/2021   IR US GUIDE VASC ACCESS RIGHT  12/20/2021   REMOVAL OF A DIALYSIS CATHETER N/A 03/05/2022   Procedure: REMOVAL OF A TUNNELED DIALYSIS CATHETER;  Surgeon: Rosetta Posner, MD;  Location: [facility];  Service: Vascular;  Laterality: N/A;   SHOULDER SURGERY Bilateral    SPINAL FUSION     TOTAL ABDOMINAL HYSTERECTOMY  1995   UPPER GI ENDOSCOPY     Social History   Socioeconomic History   Marital status: Married     Spouse name: Not on file   Number of children: Not on file   Years of education: Not on file   Highest education level: Not on file  Occupational History   Not on file  Tobacco Use   Smoking status: Every Day    Packs/day: 0.50    Types: Cigarettes   Smokeless tobacco: Never   Tobacco comments:    Slightly less than 0.5ppd as of 02/25/22  Vaping Use   Vaping Use: Never used  Substance and Sexual Activity   Alcohol use: Never   Drug use: Never   Sexual activity: Yes    Birth control/protection: Surgical    Comment: Hysterectomy  Other Topics Concern   Not on file  Social History Narrative   Not on file   Social Determinants of Health   Financial Resource Strain: Not on file  Food Insecurity: Not on file  Transportation Needs: Not on file  Physical Activity: Not on file  Stress: Not on file  Social Connections: Not on file  Intimate Partner Violence: Not on file   Allergies  Allergen Reactions   Amoxicillin Anaphylaxis   Clarithromycin Anaphylaxis, Diarrhea, Itching, Nausea And Vomiting and Rash   Codeine Itching   Hydrocodone Itching   Moxifloxacin Anaphylaxis, Anxiety, Hives, Itching, Nausea And Vomiting, Palpitations and Shortness Of Breath   Penicillins Anaphylaxis   Sulfa Antibiotics Anaphylaxis   Nitrofurantoin Nausea And Vomiting   Crab (Diagnostic) Hives   Latex Hives, Itching, Rash and Swelling    Last set of Vital Signs (not current) Vitals:   Apr 25, 2022 0400 25-Apr-2022 0727  BP: (!) 107/51 (!) 99/58  Pulse: 77 75  Resp: 20 18  Temp: 98.1 F (36.7 C) 98.9 F (37.2 C)  SpO2: 97% 97%      Physical Exam  Gen: unresponsive Cardiovascular: pulseless  Resp: apneic. Breath sounds equal bilaterally with bagging  Abd: nondistended.  PD catheter present. Neuro: GCS 3, unresponsive to pain  HEENT: No blood in posterior pharynx, gag reflex absent  Neck: No crepitus  Musculoskeletal: No deformity  Skin: warm  Procedures (when applicable, including  Critical Care time): .Critical Care  Performed by: Nathaniel Man, MD Authorized by: Nathaniel Man, MD   Critical care provider statement:    Critical care time (minutes):  40   Critical care time was exclusive of:  Separately billable procedures and treating other patients   Critical care was necessary to treat or prevent imminent or life-threatening deterioration of the following conditions:  Cardiac failure   Critical care was time spent personally by me on the following activities:  Development of treatment plan with patient or surrogate, discussions with consultants, evaluation of patient's response to treatment, examination of patient, ordering and review of laboratory studies, ordering and review of radiographic studies,  ordering and performing treatments and interventions, pulse oximetry, re-evaluation of patient's condition and review of old charts Procedure Name: Intubation Date/Time: April 10, 2022 3:16 PM  Performed by: Nathaniel Man, MDPre-anesthesia Checklist: Suction available, Patient being monitored and Patient identified Oxygen Delivery Method: Ambu bag Preoxygenation: Pre-oxygenation with 100% oxygen Induction Type: Rapid sequence Ventilation: Mask ventilation without difficulty Laryngoscope Size: Glidescope and 3 Grade View: Grade I Tube size: 7.5 mm Number of attempts: 1 Airway Equipment and Method: Video-laryngoscopy Placement Confirmation: ETT inserted through vocal cords under direct vision, Positive ETCO2, CO2 detector and Breath sounds checked- equal and bilateral Secured at: 22 cm Tube secured with: ETT holder Dental Injury: Teeth and Oropharynx as per pre-operative assessment  Future Recommendations: Recommend- induction with short-acting agent, and alternative techniques readily available       MDM / Assessment and Plan Patient found to be in asystole, actively being bagged when I got into the room.  Patient received multiple doses of epinephrine, bicarb  and calcium.  Intubated and continued to be bagged.  Significant amount of bloody secretions.  Epi drip started.  On rhythm checks patient continued to be in asystole.  Hospitalist present.  After intubated and a conversation with the hospitalist they will continue to run the code.  Patient is currently being coded for 27 minutes.  Husband is at bedside.    Nathaniel Man, MD 04/10/22 1517

## 2022-04-15 NOTE — Plan of Care (Signed)
  Problem: Education: Goal: Knowledge of General Education information will improve Description: Including pain rating scale, medication(s)/side effects and non-pharmacologic comfort measures 04-22-22 1644 by Evelena Peat, RN Outcome: Not Met (add Reason) 2022-04-22 1109 by Evelena Peat, RN Outcome: Progressing 22-Apr-2022 1109 by Evelena Peat, RN Outcome: Progressing   Problem: Health Behavior/Discharge Planning: Goal: Ability to manage health-related needs will improve 04-22-2022 1644 by Evelena Peat, RN Outcome: Not Met (add Reason) Apr 22, 2022 1109 by Evelena Peat, RN Outcome: Progressing 2022-04-22 1109 by Evelena Peat, RN Outcome: Progressing   Problem: Clinical Measurements: Goal: Ability to maintain clinical measurements within normal limits will improve 04-22-2022 1644 by Evelena Peat, RN Outcome: Not Met (add Reason) 22-Apr-2022 1109 by Evelena Peat, RN Outcome: Progressing 04/22/2022 1109 by Evelena Peat, RN Outcome: Progressing Goal: Will remain free from infection 22-Apr-2022 1644 by Evelena Peat, RN Outcome: Not Met (add Reason) 2022/04/22 1109 by Evelena Peat, RN Outcome: Progressing 2022/04/22 1109 by Evelena Peat, RN Outcome: Progressing Goal: Diagnostic test results will improve 04-22-22 1644 by Evelena Peat, RN Outcome: Not Met (add Reason) 22-Apr-2022 1109 by Evelena Peat, RN Outcome: Progressing 04-22-22 1109 by Evelena Peat, RN Outcome: Progressing Goal: Respiratory complications will improve 04/22/2022 1644 by Evelena Peat, RN Outcome: Not Met (add Reason) 2022/04/22 1109 by Evelena Peat, RN Outcome: Progressing April 22, 2022 1109 by Evelena Peat, RN Outcome: Progressing Goal: Cardiovascular complication will be avoided 04-22-22 1644 by Evelena Peat, RN Outcome: Not Met (add Reason) 2022/04/22 1109 by Evelena Peat,  RN Outcome: Progressing 2022-04-22 1109 by Evelena Peat, RN Outcome: Progressing

## 2022-04-15 NOTE — Death Summary Note (Signed)
DEATH SUMMARY   Patient Details  Name: Tracy Walsh MRN: [id number] DOB: 1965/11/11 NOT:RRNHA, Hazle Nordmann, MD Admission/Discharge Information   Admit Date:  04/27/2022  Date of Death:    Time of Death:    Length of Stay: 0   Principle Cause of death: Cardiopulmonary arrest  Hospital Diagnoses: Principal Problem:   Cellulitis Active Problems:   Diabetic foot ulcer ([place])   ESRD on peritoneal dialysis ([place])   Hypokalemia   Recurrent Clostridioides difficile diarrhea   Chronic combined systolic and diastolic CHF (congestive heart failure) ([place])   Uncontrolled type 2 diabetes mellitus with hyperglycemia, without long-term current use of insulin [facility])   Hospital Course: Taken from H&P.  Tracy Walsh is a 57 y.o. female with medical history significant of ESRD on PD, COPD, HFmrEF, CIDP, type 2 diabetes, hypertension, CAD, who presents to the ED with bilateral lower extremity redness with associated pain for 1 week.  She also endorses nausea, vomiting and chills but denies any fever.  Also has ulcers on bilateral heels, there for at least 1 year but have worsened over the past 1 week.  She denies seeing any purulent drainage. Patient was recently admitted to [facility] from 12/14 dose 12/15 where she received one-time dose of dalbavancin.  ED course: On arrival to the ED, patient was normotensive at 105/61 with heart rate of 78.  She was saturating at 99% on room air.  Initial workup remarkable for WBC of 34.9, hemoglobin of 9.4, potassium of 2.9, bicarb of 14, BNP of 632.  Bilateral foot x-ray with open wounds but no evidence of osteomyelitis.  She was started on broad-spectrum antibiotic.  12/20: Vitals stable, WBC improved to 23.6 with decrease of hemoglobin to 7.3.  No active bleeding and all cell lines decreased. sodium stable at 133, potassium at 3, bicarb 13 and renal function consistent with ESRD on peritoneal dialysis. Podiatry was consulted. MRI of right heel with  Posterior heel soft tissue ulcer with underlying minimal patchy subcortical marrow edema in the posterior calcaneus. This could reflect reactive marrow change or early osteomyelitis. No evidence of soft tissue abscess.  Mild tenosynovitis of the peroneal tendons, posterior tibial tendon, and extensor digitorum tendons.  Patient was somnolent during morning rounds.  Easily arousable.  Stating lower extremity pain and would like to get some sleep as did not slept well last night.  She was lying flat with no evidence of any distress.  Around 2:15 PM, husband at bedside noticed that she is unresponsive so called the nursing staff, she was without any pulse or breathing so a CODE BLUE was called.  She was found to be in asystole.  When I saw her again code was already in place. Per nursing staff 15 minutes ago she was seen with stable vitals and offered water which she drank some.  No concern of any distress at that time either.  She went back to sleep after drinking some water.   Code was run her ACLS protocol for more than 35-minute, she was intubated.  Never regain pulse or breathing.  Pupils dilated and fixed.  She was pronounced dead at 3:05 PM.  Assessment and Plan: * Cellulitis Patient presenting with bilateral lower extremity erythema that has progressively worsened despite treatment with IV Dalbavancin.  Her right lower extremity significantly worse than the left.  Given lack of improvement and diabetic foot wound present on the right heel, will broaden antibiotic coverage to include anaerobic and Pseudomonas.  - Continue vancomycin per  pharmacy dosing - Start cefepime per pharmacy dosing - Flagyl 500 mg twice daily  Diabetic foot ulcer ([place]) On examination, patient has a rather large foot ulcer on her right heel that has been chronic but appears significantly worsened compared to prior.  Given that it has enlarged and rather quickly in the last week, will obtain an MRI to rule out  osteomyelitis.  X-ray was negative.  - MRI right heel pending - CRP pending - Broad-spectrum antimicrobial coverage as noted above: Vancomycin, cefepime Flagyl  ESRD on peritoneal dialysis [facility]) - Nephrology consulted; appreciate their recommendations - PD ordered for tonight  Hypokalemia - Restart home potassium supplementation.  Recurrent Clostridioides difficile diarrhea Patient states she has a 2-year history of recurrent C. difficile that has been resistant to treatment.  She is planning to undergo fecal transplant in January 2024.  -Continue oral vancomycin  Chronic combined systolic and diastolic CHF (congestive heart failure) (HCC) Hypervolemic on examination, however undergoing PD this evening.  If remains volume overloaded tomorrow could consider Lasix.  Uncontrolled type 2 diabetes mellitus with hyperglycemia, without long-term current use of insulin (HCC) - SSI, sensitive  Procedures: None  Consultations: None  The results of significant diagnostics from this hospitalization (including imaging, microbiology, ancillary and laboratory) are listed below for reference.   Significant Diagnostic Studies: MR HEEL RIGHT WO CONTRAST  Result Date: 04-17-2022 CLINICAL DATA:  Foot swelling, diabetic, osteomyelitis suspected, xray done EXAM: MR OF THE RIGHT HEEL WITHOUT CONTRAST TECHNIQUE: Multiplanar, multisequence MR imaging of the right heel was performed. No intravenous contrast was administered. COMPARISON:  Foot radiograph 03/16/2022. FINDINGS: Bones/Joint/Cartilage There is minimal patchy subcortical marrow edema signal in the posterior calcaneus (series 5, image 24, sagittal STIR image 9). There is preserved T1 marrow signal. The cortex is intact. No evidence of fracture. Mild first TMT joint osteoarthritis. Ligaments Intact anterior posterior talofibular ligaments. Intact calcaneofibular ligament. Intact high ankle ligaments. Intact deltoid ligament and spring ligament.  Muscles and Tendons Mild tenosynovitis of the peroneal tendons above and below the lateral malleolus. Mild tenosynovitis of the posterior tibial tendon and flexor digitorum tendon below the malleolus. Mild tenosynovitis of the extensor digitorum tendons. No acute tendon tear. The Achilles tendon is intact.  The plantar fascia is intact. There is diffuse muscle atrophy in the foot, compatible with chronic denervation change. Soft tissues There is generalized soft tissue swelling of the hindfoot. There is a posterior heel soft tissue ulcer with underlying soft tissue swelling but no well-defined/drainable focal fluid collection. IMPRESSION: Posterior heel soft tissue ulcer with underlying minimal patchy subcortical marrow edema in the posterior calcaneus. This could reflect reactive marrow change or early osteomyelitis. No evidence of soft tissue abscess. Mild tenosynovitis of the peroneal tendons, posterior tibial tendon, and extensor digitorum tendons. Electronically Signed   By: Maurine Simmering M.D.   On: 04/17/2022 09:34   DG Foot Complete Left  Result Date: 03/23/2022 CLINICAL DATA:  Diabetic wound. EXAM: LEFT FOOT - COMPLETE 3 VIEW; RIGHT FOOT COMPLETE - 3 VIEW COMPARISON:  None Available. FINDINGS: No acute fracture, dislocation or subluxation. No osteolytic or osteoblastic changes. No bony destructive process. Soft tissue air consistent with open noted posterior to the left calcaneus. There are posterior and plantar calcaneal spurs bilaterally. Extensive vascular calcifications identified. IMPRESSION: Open wound on the left posterior to the calcaneus. No acute osseous abnormalities or bony destructive process. Electronically Signed   By: Sammie Bench M.D.   On: 03/27/2022 15:38   DG Foot Complete Right  Result  Date: 03/29/2022 CLINICAL DATA:  Diabetic wound. EXAM: LEFT FOOT - COMPLETE 3 VIEW; RIGHT FOOT COMPLETE - 3 VIEW COMPARISON:  None Available. FINDINGS: No acute fracture, dislocation or  subluxation. No osteolytic or osteoblastic changes. No bony destructive process. Soft tissue air consistent with open noted posterior to the left calcaneus. There are posterior and plantar calcaneal spurs bilaterally. Extensive vascular calcifications identified. IMPRESSION: Open wound on the left posterior to the calcaneus. No acute osseous abnormalities or bony destructive process. Electronically Signed   By: Sammie Bench M.D.   On: 03/28/2022 15:38   DG Chest 2 View  Result Date: 03/31/2022 CLINICAL DATA:  Chest pain EXAM: CHEST - 2 VIEW COMPARISON:  CT chest abdomen and pelvis March 28, 2022, chest CT March 28, 2022 FINDINGS: Stable positioning of left chest wall port catheter. Partially visualized neurostimulator device in place. The cardiomediastinal silhouette is unchanged in contour. No focal pulmonary opacity. No pleural effusion or pneumothorax. The visualized upper abdomen is unremarkable. No acute osseous abnormality. IMPRESSION: No acute cardiopulmonary abnormality. Electronically Signed   By: Beryle Flock M.D.   On: 03/31/2022 19:41   CT CHEST ABDOMEN PELVIS WO CONTRAST  Result Date: 03/28/2022 CLINICAL DATA:  Sepsis. Neck pain after fall. Mid chest pain. Fall comparison EXAM: CT CHEST, ABDOMEN AND PELVIS WITHOUT CONTRAST TECHNIQUE: Multidetector CT imaging of the chest, abdomen and pelvis was performed following the standard protocol without IV contrast. RADIATION DOSE REDUCTION: This exam was performed according to the departmental dose-optimization program which includes automated exposure control, adjustment of the mA and/or kV according to patient size and/or use of iterative reconstruction technique. COMPARISON:  None Available. FINDINGS: CT CHEST FINDINGS CT CHEST FINDINGS Cardiovascular: No evidence of aortic injury on noncontrast exam. Coronary stent noted. No pericardial fluid Mediastinum/Nodes: No axillary or supraclavicular adenopathy. No mediastinal or hilar  adenopathy. No pericardial fluid. Esophagus normal. Lungs/Pleura: No pulmonary contusion or pleural fluid. No pneumothorax. Airspace disease. There are bilateral lower lobe patchy ground-glass densities (image 128/series 4/RIGHT lower lobe) (image 121/series 4/LEFT lower lobe). Similar findings in the inferior RIGHT middle lobe. Musculoskeletal: No aggressive osseous lesion. CT ABDOMEN AND PELVIS FINDINGS Hepatobiliary: No focal hepatic lesion. No biliary ductal dilatation. Gallbladder is normal. Common bile duct is normal. Pancreas: Pancreas is normal. No ductal dilatation. No pancreatic inflammation. Spleen: Normal spleen Adrenals/urinary tract: Adrenal glands and kidneys are normal. Ureters and bladder normal. Dialysate catheter coiled the pelvis.  Small amount fluid the pelvis Stomach/Bowel: Stomach, small bowel, appendix, and cecum are normal. The colon and rectosigmoid colon are normal. Vascular/Lymphatic: Abdominal aorta is normal caliber. There is no retroperitoneal or periportal lymphadenopathy. No pelvic lymphadenopathy. Reproductive: Uterus and adnexa unremarkable. Other: Small volume free fluid the pelvis Musculoskeletal: No aggressive osseous lesion. Lucency within the posterior iliac bones is favored late osteopenia degenerative change (image 99/3. Posterior lumbar fusion. IMPRESSION: Chest Impression: 1. No evidence of thoracic trauma. 2. Scattered ground-glass densities at the lung bases are favored atelectasis or edema. Infection felt less likely. Abdomen / Pelvis Impression: 1. No evidence of trauma in the abdomen pelvis. 2. Small free fluid related to peritoneal dialysis. Electronically Signed   By: Suzy Bouchard M.D.   On: 03/28/2022 17:05   CT Cervical Spine Wo Contrast  Result Date: 03/28/2022 CLINICAL DATA:  Trauma EXAM: CT CERVICAL SPINE WITHOUT CONTRAST TECHNIQUE: Multidetector CT imaging of the cervical spine was performed without intravenous contrast. Multiplanar CT image  reconstructions were also generated. RADIATION DOSE REDUCTION: This exam was performed according to the  departmental dose-optimization program which includes automated exposure control, adjustment of the mA and/or kV according to patient size and/or use of iterative reconstruction technique. COMPARISON:  None Available. FINDINGS: Alignment: Normal. Skull base and vertebrae: No acute fracture. No primary bone lesion or focal pathologic process. Severe facet degenerative change at C2-C3 left. Soft tissues and spinal canal: No prevertebral fluid or swelling. No visible canal hematoma. Disc levels:  No evidence of high-grade spinal canal stenosis. Upper chest: No focal opacity. Left-sided central venous catheter in place. Other: None. IMPRESSION: No acute fracture or traumatic malalignment of the cervical spine. Electronically Signed   By: Marin Roberts M.D.   On: 03/28/2022 16:47   US Venous Img Lower Right (DVT Study)  Result Date: 03/28/2022 CLINICAL DATA:  Right lower extremity pain and swelling for 2 days. EXAM: RIGHT LOWER EXTREMITY VENOUS DOPPLER ULTRASOUND TECHNIQUE: Gray-scale sonography with compression, as well as color and duplex ultrasound, were performed to evaluate the deep venous system(s) from the level of the common femoral vein through the popliteal and proximal calf veins. COMPARISON:  None Available. FINDINGS: VENOUS Normal compressibility of the common femoral, superficial femoral, and popliteal veins, as well as the visualized calf veins. Visualized portions of profunda femoral vein and great saphenous vein unremarkable. No filling defects to suggest DVT on grayscale or color Doppler imaging. Doppler waveforms show normal direction of venous flow, normal respiratory plasticity and response to augmentation. Limited views of the contralateral common femoral vein are unremarkable. OTHER Subcutaneous soft tissue edema about the calf. Limitations: none IMPRESSION: 1. No evidence of right lower  extremity DVT. 2. Subcutaneous soft tissue edema about the calf. Electronically Signed   By: Keane Police D.O.   On: 03/28/2022 15:33   DG Foot Complete Right  Result Date: 03/28/2022 CLINICAL DATA:  Swelling and pain in right foot. Evaluate for osteomyelitis. EXAM: RIGHT FOOT COMPLETE - 3+ VIEW COMPARISON:  Right foot radiographs 08/29/2021 FINDINGS: Small plantar and minimal posterior calcaneal heel spurs, unchanged. Mild dorsal talonavicular degenerative osteophytes. No acute fracture or dislocation. No cortical erosion is seen. No subcutaneous air. Moderate vascular calcifications. Possible soft tissue wound at the posterior plantar aspect of the heel, similar to prior. IMPRESSION: Possible soft tissue wound at the posterior plantar aspect of the heel, similar to prior. No cortical erosion to indicate radiographic evidence of osteomyelitis. Electronically Signed   By: Yvonne Kendall M.D.   On: 03/28/2022 14:29   DG Chest Port 1 View  Result Date: 03/28/2022 CLINICAL DATA:  Shortness of breath. EXAM: PORTABLE CHEST 1 VIEW COMPARISON:  AP chest 12/18/2021 FINDINGS: Left chest wall porta catheter tip again overlies the central superior vena cava. Spinal cord stimulator electrodes again overlie the lower thoracic spine. Mild dextrocurvature of the mid thoracic spine. Cardiac silhouette and mediastinal contours are within limits. The lungs are clear. No pleural effusion or pneumothorax. IMPRESSION: No active disease. Electronically Signed   By: Yvonne Kendall M.D.   On: 03/28/2022 14:27   CT ABDOMEN PELVIS WO CONTRAST  Result Date: 03/21/2022 CLINICAL DATA:  Peritonitis perforation suspected, vomiting, weakness, currently undergoing treatment for C diff EXAM: CT ABDOMEN AND PELVIS WITHOUT CONTRAST TECHNIQUE: Multidetector CT imaging of the abdomen and pelvis was performed following the standard protocol without IV contrast. RADIATION DOSE REDUCTION: This exam was performed according to the departmental  dose-optimization program which includes automated exposure control, adjustment of the mA and/or kV according to patient size and/or use of iterative reconstruction technique. COMPARISON:  08/29/2021 FINDINGS: Lower chest:  Bronchial wall thickening in the bilateral lung bases with extensively scattered centrilobular ground-glass nodularity (series 4, image 9). Hepatobiliary: No focal liver abnormality is seen. Hepatomegaly, maximum coronal span 22.6 cm. Status post cholecystectomy. No biliary dilatation. Pancreas: Unremarkable. No pancreatic ductal dilatation or surrounding inflammatory changes. Spleen: Normal in size without significant abnormality. Benign granulomatous calcifications of the splenic parenchyma. Adrenals/Urinary Tract: Adrenal glands are unremarkable. Kidneys are normal, without renal calculi, solid lesion, or hydronephrosis. Bladder is unremarkable. Stomach/Bowel: Stomach is within normal limits. Appendix appears normal. No evidence of bowel wall thickening, distention, or inflammatory changes. Vascular/Lymphatic: Aortic atherosclerosis. No enlarged abdominal or pelvic lymph nodes. Reproductive: Status post hysterectomy. Other: No abdominal wall hernia or abnormality. Tenckhoff type peritoneal dialysis catheter in the low pelvis. Minimal dialysate in the pelvis (series 2, image 76) Musculoskeletal: No acute or significant osseous findings. IMPRESSION: 1. No acute noncontrast CT findings of the abdomen or pelvis to explain abdominal pain. Specifically, no imaging evidence of colitis or perforation. 2. Tenckhoff type peritoneal dialysis catheter in the low pelvis. Minimal dialysate in the pelvis. 3. Hepatomegaly. 4. Bronchial wall thickening in the bilateral lung bases with extensively scattered centrilobular ground-glass nodularity, consistent with nonspecific infection or inflammation. Aortic Atherosclerosis (ICD10-I70.0). Electronically Signed   By: Delanna Ahmadi M.D.   On: 03/21/2022 18:47     Microbiology: Recent Results (from the past 240 hour(s))  Blood culture (routine x 2)     Status: None   Collection Time: 03/28/22  3:10 PM   Specimen: Porta Cath; Blood  Result Value Ref Range Status   Specimen Description PORTA CATH  Final   Special Requests   Final    Normal BOTTLES DRAWN AEROBIC AND ANAEROBIC Blood Culture adequate volume PORTA CATH   Culture   Final    NO GROWTH 5 DAYS Performed at [facility], [address], [place], [place] [address]    Report Status 03/16/2022 FINAL  Final  Blood culture (routine x 2)     Status: None   Collection Time: 03/28/22  3:18 PM   Specimen: Site Not Specified; Blood  Result Value Ref Range Status   Specimen Description SITE NOT SPECIFIED  Final   Special Requests   Final    BOTTLES DRAWN AEROBIC AND ANAEROBIC Blood Culture adequate volume   Culture   Final    NO GROWTH 5 DAYS Performed at [facility], [address], [place], [place] [address]    Report Status 04/01/2022 FINAL  Final  Resp panel by RT-PCR (RSV, Flu A&B, Covid) Anterior Nasal Swab     Status: None   Collection Time: 04/01/22  5:51 PM   Specimen: Anterior Nasal Swab  Result Value Ref Range Status   SARS Coronavirus 2 by RT PCR NEGATIVE NEGATIVE Final    Comment: (NOTE) SARS-CoV-2 target nucleic acids are NOT DETECTED.  The SARS-CoV-2 RNA is generally detectable in upper respiratory specimens during the acute phase of infection. The lowest concentration of SARS-CoV-2 viral copies this assay can detect is 138 copies/mL. A negative result does not preclude SARS-Cov-2 infection and should not be used as the sole basis for treatment or other patient management decisions. A negative result may occur with  improper specimen collection/handling, submission of specimen other than nasopharyngeal swab, presence of viral mutation(s) within the areas targeted by this assay, and inadequate number of viral copies(<138 copies/mL). A negative result must be combined  with clinical observations, patient history, and epidemiological information. The expected result is Negative.  Fact Sheet for  Patients:  EntrepreneurPulse.com.au  Fact Sheet for Healthcare Providers:  IncredibleEmployment.be  This test is no t yet approved or cleared by the [place] FDA and  has been authorized for detection and/or diagnosis of SARS-CoV-2 by FDA under an Emergency Use Authorization (EUA). This EUA will remain  in effect (meaning this test can be used) for the duration of the COVID-19 declaration under Section 564(b)(1) of the Act, 21 U.S.C.section 360bbb-3(b)(1), unless the authorization is terminated  or revoked sooner.       Influenza A by PCR NEGATIVE NEGATIVE Final   Influenza B by PCR NEGATIVE NEGATIVE Final    Comment: (NOTE) The Xpert Xpress SARS-CoV-2/FLU/RSV plus assay is intended as an aid in the diagnosis of influenza from Nasopharyngeal swab specimens and should not be used as a sole basis for treatment. Nasal washings and aspirates are unacceptable for Xpert Xpress SARS-CoV-2/FLU/RSV testing.  Fact Sheet for Patients: EntrepreneurPulse.com.au  Fact Sheet for Healthcare Providers: IncredibleEmployment.be  This test is not yet approved or cleared by the [place] FDA and has been authorized for detection and/or diagnosis of SARS-CoV-2 by FDA under an Emergency Use Authorization (EUA). This EUA will remain in effect (meaning this test can be used) for the duration of the COVID-19 declaration under Section 564(b)(1) of the Act, 21 U.S.C. section 360bbb-3(b)(1), unless the authorization is terminated or revoked.     Resp Syncytial Virus by PCR NEGATIVE NEGATIVE Final    Comment: (NOTE) Fact Sheet for Patients: EntrepreneurPulse.com.au  Fact Sheet for Healthcare Providers: IncredibleEmployment.be  This test is not yet approved  or cleared by the [place] FDA and has been authorized for detection and/or diagnosis of SARS-CoV-2 by FDA under an Emergency Use Authorization (EUA). This EUA will remain in effect (meaning this test can be used) for the duration of the COVID-19 declaration under Section 564(b)(1) of the Act, 21 U.S.C. section 360bbb-3(b)(1), unless the authorization is terminated or revoked.  Performed at [facility], [address] [place], [place] [address]   Culture, blood (routine x 2)     Status: None (Preliminary result)   Collection Time: 03/16/2022  2:58 PM   Specimen: Left Antecubital; Blood  Result Value Ref Range Status   Specimen Description LEFT ANTECUBITAL  Final   Special Requests   Final    BOTTLES DRAWN AEROBIC AND ANAEROBIC Blood Culture adequate volume   Culture   Final    NO GROWTH < 24 HOURS Performed at [facility], [address], [place], [place] [address]    Report Status PENDING  Incomplete  Culture, blood (routine x 2)     Status: None (Preliminary result)   Collection Time: 04/01/2022  5:50 PM   Specimen: BLOOD  Result Value Ref Range Status   Specimen Description BLOOD port  Final   Special Requests   Final    BOTTLES DRAWN AEROBIC AND ANAEROBIC Blood Culture adequate volume   Culture   Final    NO GROWTH < 24 HOURS Performed at [facility], [address] [place], [place] [address]    Report Status PENDING  Incomplete    Time spent: 55 minutes  This record has been created using Systems analyst. Errors have been sought and corrected,but may not always be located. Such creation errors do not reflect on the standard of care.   Signed: Lorella Nimrod, MD 2022-04-14

## 2022-04-15 NOTE — Progress Notes (Signed)
Nephrology team present during code.

## 2022-04-15 NOTE — Discharge Planning (Signed)
ESTABLISHED PERITONEAL DIALYSIS Outpatient Facility: [facility] [address] [place], [place] [address] [phone]

## 2022-04-15 NOTE — Progress Notes (Signed)
04/05/22 1015: Mr Tracy Walsh called back. Explained he would need to come to hospital to sign consent for autopsy. He agreed he would come today. Answered questions he had. Stated he still would like to speak to another physician about his wife, but not Dr. Reesa Chew.

## 2022-04-15 NOTE — Plan of Care (Signed)
  Problem: Education: Goal: Knowledge of General Education information will improve Description: Including pain rating scale, medication(s)/side effects and non-pharmacologic comfort measures Outcome: Progressing   Problem: Health Behavior/Discharge Planning: Goal: Ability to manage health-related needs will improve Outcome: Progressing   Problem: Clinical Measurements: Goal: Ability to maintain clinical measurements within normal limits will improve Outcome: Progressing Goal: Will remain free from infection Outcome: Progressing Goal: Diagnostic test results will improve Outcome: Progressing Goal: Respiratory complications will improve Outcome: Progressing Goal: Cardiovascular complication will be avoided Outcome: Progressing   Problem: Elimination: Goal: Will not experience complications related to bowel motility Outcome: Progressing Goal: Will not experience complications related to urinary retention Outcome: Progressing   Problem: Pain Managment: Goal: General experience of comfort will improve Outcome: Progressing   

## 2022-04-15 NOTE — Progress Notes (Signed)
2022/04/07 Dr. Reesa Chew had tried to speak with the patient's husband in the room several times during the code. He could be heard loudly saying "I don't want your excuses, I want to know what happened" each time a provider tried to speak with him

## 2022-04-15 NOTE — Progress Notes (Signed)
   04/14/2022 1430  Clinical Encounter Type  Visited With Patient and family together  Visit Type Initial;Code;Death  Referral From Physician  Consult/Referral To Chaplain  Spiritual Encounters  Spiritual Needs Grief support   Chaplain responded to Code Blue. CPR in progress. Chaplain encountered patients husband at bedside and Chaplain remained with him to provide support until after TOD was called.

## 2022-04-15 NOTE — Significant Event (Signed)
1430: Responded to code blue overhead page. On arrival to room, effective CPR being performed, pt being ventilated with a bag valve mask. ED P present, Dr. Jori Moll. Reported pt was PEA. 1434 1mg  Epi given 1435 CBG 102 1435 Bicarb given 1435 calcium given 1437 No pulse, CPR resumed 1437 Epi given 1438 Bicarb given  1440 Epi given, Pulse check, no pulse, cpr resumed. Pt well preoxygenated with bag valve mask and high flow oxygen prior to intubabion attempt by Dr. Jori Moll. EDP Used a glide scope, stated saw tube pass through cords, positive color change on ETCO2 detector. Bilateral easy rise and fall of chest with bvm respiration. Breath sounds ausc.Tube secured with tube holder.  1443 Bicarb given, epi given, flush between meds. Peep valve set to 20 applied. 1446 epi given 1447 bicarb given 1449 epi given 1450 pulse check, no pulse Epi drip hung at 1450, 60 mcg/kg/min 1453 Epi given 1455 pulse check, no pulse 1456 epi given 1459 epi given 1500 no pulse 1502 epi given 1505. Time of death  All medications given IV. Followed by push.Husband present in room during resus. Pastoral care present with him.

## 2022-04-15 NOTE — Plan of Care (Signed)
  Problem: Activity: Goal: Risk for activity intolerance will decrease 04-09-22 1109 by Evelena Peat, RN Outcome: Progressing 2022-04-09 1109 by Evelena Peat, RN Outcome: Progressing

## 2022-04-15 NOTE — TOC Initial Note (Signed)
Transition of Care [facility]) - Initial/Assessment Note    Patient Details  Name: Tracy Walsh MRN: [id number] Date of Birth: October 18, 1965  Transition of Care [facility]) CM/SW Contact:    Laurena Slimmer, RN Phone Number: 04-04-22, 9:52 AM  Clinical Narrative:                  Transition of Care [facility]) Screening Note   Patient Details  Name: Tracy Walsh Date of Birth: 08-06-1965   Transition of Care [facility]) CM/SW Contact:    Laurena Slimmer, RN Phone Number: 04-04-22, 9:52 AM    Transition of Care [facility]) has reviewed patient and no TOC needs have been identified at this time. We will continue to monitor patient advancement through interdisciplinary progression rounds. If new patient transition needs arise, please place a TOC consult.          Patient Goals and CMS Choice            Expected Discharge Plan and Services                                                Prior Living Arrangements/Services                       Activities of Daily Living      Permission Sought/Granted                  Emotional Assessment              Admission diagnosis:  Cellulitis [L03.90] Patient Active Problem List   Diagnosis Date Noted   Cellulitis 03/26/2022   Cellulitis of lower extremity 03/28/2022   ESRD on peritoneal dialysis ([place]) 03/28/2022   Peritonitis ([place]) 03/21/2022   Diarrhea 02/28/2022   Generalized anxiety disorder with panic attacks 01/25/2022   Major depressive disorder, recurrent episode ([place]) 01/25/2022   Insomnia 01/25/2022   Sleep apnea 01/25/2022   H/O total hysterectomy 01/25/2022   Bilateral lower extremity pain 01/10/2022   COPD (chronic obstructive pulmonary disease) ([place]) 01/02/2022   Debility 01/02/2022   Acute renal failure superimposed on stage 4 chronic kidney disease, unspecified acute renal failure type ([place]) 12/16/2021   Increased anion gap metabolic acidosis 71/69/6789   Elevated troponin  12/16/2021   Hyperkalemia 12/16/2021   Nausea & vomiting 12/16/2021   GERD (gastroesophageal reflux disease) 12/16/2021   HLD (hyperlipidemia) 12/16/2021   Chronic kidney disease, stage 4 (severe) ([place]) 12/07/2021   CIDP (chronic inflammatory demyelinating polyneuropathy) ([place]) 12/07/2021   Tobacco use 12/07/2021   Preventative health care 12/07/2021   Lower extremity edema 11/26/2021   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   Iron deficiency anemia 10/11/2021   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)    Chronic combined systolic and diastolic CHF (congestive heart failure) ([place]) 08/31/2021   Chronic respiratory failure with hypoxia ([place]) 08/31/2021   Acute renal failure superimposed on stage 4 chronic kidney disease ([place]) 08/29/2021   Hypokalemia 08/29/2021   Essential hypertension 08/29/2021   Uncontrolled type 2 diabetes mellitus with hyperglycemia, without long-term current use of insulin ([place]) 08/29/2021   Diabetic foot ulcer ([place]) 08/29/2021   CAD (coronary artery disease) 08/29/2021   Diabetic foot ([place]) 01/04/2020   PCP:  [name], MD Pharmacy:   [id number] [place],  [place] [address] [place] [address] Phone: [phone] Fax: [phone]     Social Determinants of Health (SDOH) Social History: SDOH Screenings   Depression (PHQ2-9): Medium Risk (02/27/2022)  Tobacco Use: High Risk (04/12/2022)   SDOH Interventions:     Readmission Risk Interventions    08/30/2021    1:38 PM  Readmission Risk Prevention Plan  Transportation Screening Complete  Home Care Screening Complete  Medication Review (RN CM) Complete

## 2022-04-15 NOTE — Progress Notes (Signed)
04/13/2022 1000: Pt''s husband called, requested an autopsy. Explained he would have to do it as a private autopsy and what the cost was. He mentioned, "you don't even know why she died" and said he would have to call back later.  04/13/2022 1151: Mr Mirando called back stating he didn't understand why his wife had died and why she didn't have a heart monitor on. He stated he wanted to speak with a MD about her death. He stated "she had CHF, ESRD, infection in her legs and all they did was give her antibiotics, antibiotics,antibiotics. He stated a MD had spoken with him yesterday and he felt like she had blamed him(for her death)  [place] chatted Dr. Reesa Chew to call pt's husband about her death.

## 2022-04-15 NOTE — Progress Notes (Signed)
04/05/2022 0930 Called Mr. Brazzle. Left voicemail requesting call back. HIPAA compliant.

## 2022-04-15 NOTE — Progress Notes (Signed)
[facility]  ROUNDING NOTE   Subjective:   Tracy Walsh is a 57 y.o. female with past medical history of type 2 diabetes, hypertension, CAD, COPD, heart failure, and end-stage renal disease on peritoneal dialysis.  Patient presents to the emergency department with lower extremity redness and has been admitted under observation for Cellulitis [L03.90]  Patient is known to our practice and is followed outpatient at [facility] by Dr. Holley Raring.  Patient is seen resting quietly in bed.  Very somnolent.  Her nursing staff, patient received oral pain medication 1 hour prior.  Chart review used to obtain history.  Patient presented to the emergency department complaining of 1 week of lower extremity redness and pain.  Patient was recently seen in the emergency department for the same complaint, given antibiotics, and discharged. She also reported nausea, vomiting, and chills.  She states she returned to the emergency department, felt like symptoms were not improving since emergency department discharge.  Labs on arrival significant for sodium 131, potassium 3.2, glucose 259, BUN 58, creatinine 10.3 with GFR for, calcium 7.1, troponin 32, WBCs 24.3 with hemoglobin 8.5.  Respiratory panel negative for influenza, COVID-19, and RSV.  Blood cultures pending.  Chest x-ray shows no acute findings.  We have been consulted consulted to manage dialysis needs.  Objective:  Vital signs in last 24 hours:  Temp:  [97.7 F (36.5 C)-98.9 F (37.2 C)] 98.9 F (37.2 C) (12/20 0727) Pulse Rate:  [72-81] 75 (12/20 0727) Resp:  [11-20] 18 (12/20 0727) BP: (99-143)/(51-64) 99/58 (12/20 0727) SpO2:  [97 %-99 %] 97 % (12/20 0727)  Weight change:  Filed Weights   04/09/2022 1445  Weight: 81.6 kg    Intake/Output: No intake/output data recorded.   Intake/Output this shift:  Total I/O In: 120 [P.O.:120] Out: -   Physical Exam: General: NAD  Head: Normocephalic, atraumatic. Moist oral mucosal  membranes  Eyes: Anicteric  Lungs:  Clear to auscultation, normal effort  Heart: Regular rate and rhythm  Abdomen:  Soft, nontender, PD catheter  Extremities: Trace peripheral edema.,  Erythema bilateral shins  Neurologic: Somnolent, arousable for very short period  Skin: No lesions, dry, hot to touch  Access: [facility]    Basic Metabolic Panel: Recent Labs  Lab 03/28/22 1343 03/28/22 1543 03/29/22 0316 03/31/22 1934 04/14/2022 1458 21-Apr-2022 0503  NA 134*  --  132* 131* 133* 133*  K 2.6*  --  3.0* 3.2* 2.9* 3.0*  CL 102  --  105 100 98 102  CO2 12*  --  13* 13* 14* 13*  GLUCOSE 254*  --  89 259* 273* 137*  BUN 53*  --  60* 58* 58* 55*  CREATININE 10.06*  --  10.32* 10.30* 10.13* 9.93*  CALCIUM 7.0*  --  6.9* 7.1* 7.3* 6.6*  MG  --  1.6*  --   --   --   --     Liver Function Tests: No results for input(s): "AST", "ALT", "ALKPHOS", "BILITOT", "PROT", "ALBUMIN" in the last 168 hours. No results for input(s): "LIPASE", "AMYLASE" in the last 168 hours. No results for input(s): "AMMONIA" in the last 168 hours.  CBC: Recent Labs  Lab 03/28/22 1343 03/29/22 0316 03/31/22 1934 03/27/2022 1458 Apr 21, 2022 0503  WBC 28.1* 19.9* 24.3* 34.9* 23.6*  NEUTROABS  --   --   --  32.4*  --   HGB 10.5* 8.7* 9.5* 9.4* 7.3*  HCT 30.5* 25.3* 28.0* 27.5* 21.3*  MCV 82.4 83.0 82.4 81.4 81.6  PLT  304 250 301 339 258    Cardiac Enzymes: No results for input(s): "CKTOTAL", "CKMB", "CKMBINDEX", "TROPONINI" in the last 168 hours.  BNP: Invalid input(s): "POCBNP"  CBG: Recent Labs  Lab 03/29/22 0806 03/29/22 1229 03/18/2022 2343 Apr 08, 2022 0728 04/08/22 1123  GLUCAP 87 224* 104* 118* 108*    Microbiology: Results for orders placed or performed during the hospital encounter of 03/17/2022  Culture, blood (routine x 2)     Status: None (Preliminary result)   Collection Time: 04/08/2022  2:58 PM   Specimen: Left Antecubital; Blood  Result Value Ref Range Status   Specimen Description LEFT  ANTECUBITAL  Final   Special Requests   Final    BOTTLES DRAWN AEROBIC AND ANAEROBIC Blood Culture adequate volume   Culture   Final    NO GROWTH < 24 HOURS Performed at [facility], [address], [place], [place] [address]    Report Status PENDING  Incomplete  Culture, blood (routine x 2)     Status: None (Preliminary result)   Collection Time: 04/05/2022  5:50 PM   Specimen: BLOOD  Result Value Ref Range Status   Specimen Description BLOOD port  Final   Special Requests   Final    BOTTLES DRAWN AEROBIC AND ANAEROBIC Blood Culture adequate volume   Culture   Final    NO GROWTH < 24 HOURS Performed at [facility], [address], [place],  [address]    Report Status PENDING  Incomplete    Coagulation Studies: No results for input(s): "LABPROT", "INR" in the last 72 hours.  Urinalysis: No results for input(s): "COLORURINE", "LABSPEC", "PHURINE", "GLUCOSEU", "HGBUR", "BILIRUBINUR", "KETONESUR", "PROTEINUR", "UROBILINOGEN", "NITRITE", "LEUKOCYTESUR" in the last 72 hours.  Invalid input(s): "APPERANCEUR"    Imaging: MR HEEL RIGHT WO CONTRAST  Result Date: 04/08/2022 CLINICAL DATA:  Foot swelling, diabetic, osteomyelitis suspected, xray done EXAM: MR OF THE RIGHT HEEL WITHOUT CONTRAST TECHNIQUE: Multiplanar, multisequence MR imaging of the right heel was performed. No intravenous contrast was administered. COMPARISON:  Foot radiograph 03/31/2022. FINDINGS: Bones/Joint/Cartilage There is minimal patchy subcortical marrow edema signal in the posterior calcaneus (series 5, image 24, sagittal STIR image 9). There is preserved T1 marrow signal. The cortex is intact. No evidence of fracture. Mild first TMT joint osteoarthritis. Ligaments Intact anterior posterior talofibular ligaments. Intact calcaneofibular ligament. Intact high ankle ligaments. Intact deltoid ligament and spring ligament. Muscles and Tendons Mild tenosynovitis of the peroneal tendons above and  below the lateral malleolus. Mild tenosynovitis of the posterior tibial tendon and flexor digitorum tendon below the malleolus. Mild tenosynovitis of the extensor digitorum tendons. No acute tendon tear. The Achilles tendon is intact.  The plantar fascia is intact. There is diffuse muscle atrophy in the foot, compatible with chronic denervation change. Soft tissues There is generalized soft tissue swelling of the hindfoot. There is a posterior heel soft tissue ulcer with underlying soft tissue swelling but no well-defined/drainable focal fluid collection. IMPRESSION: Posterior heel soft tissue ulcer with underlying minimal patchy subcortical marrow edema in the posterior calcaneus. This could reflect reactive marrow change or early osteomyelitis. No evidence of soft tissue abscess. Mild tenosynovitis of the peroneal tendons, posterior tibial tendon, and extensor digitorum tendons. Electronically Signed   By: Maurine Simmering M.D.   On: 2022/04/08 09:34   DG Foot Complete Left  Result Date: 03/16/2022 CLINICAL DATA:  Diabetic wound. EXAM: LEFT FOOT - COMPLETE 3 VIEW; RIGHT FOOT COMPLETE - 3 VIEW COMPARISON:  None Available. FINDINGS: No acute fracture,  dislocation or subluxation. No osteolytic or osteoblastic changes. No bony destructive process. Soft tissue air consistent with open noted posterior to the left calcaneus. There are posterior and plantar calcaneal spurs bilaterally. Extensive vascular calcifications identified. IMPRESSION: Open wound on the left posterior to the calcaneus. No acute osseous abnormalities or bony destructive process. Electronically Signed   By: Sammie Bench M.D.   On: 03/18/2022 15:38   DG Foot Complete Right  Result Date: 04/11/2022 CLINICAL DATA:  Diabetic wound. EXAM: LEFT FOOT - COMPLETE 3 VIEW; RIGHT FOOT COMPLETE - 3 VIEW COMPARISON:  None Available. FINDINGS: No acute fracture, dislocation or subluxation. No osteolytic or osteoblastic changes. No bony destructive process.  Soft tissue air consistent with open noted posterior to the left calcaneus. There are posterior and plantar calcaneal spurs bilaterally. Extensive vascular calcifications identified. IMPRESSION: Open wound on the left posterior to the calcaneus. No acute osseous abnormalities or bony destructive process. Electronically Signed   By: Sammie Bench M.D.   On: 04/04/2022 15:38     Medications:    ceFEPime (MAXIPIME) IV 1 g (03/31/2022 2314)   dialysis solution 2.5% low-MG/low-CA     metronidazole 500 mg (04-11-2022 0807)    ferrous sulfate  325 mg Oral Q breakfast   gentamicin cream  1 Application Topical Daily   heparin  5,000 Units Subcutaneous Q8H   insulin aspart  0-9 Units Subcutaneous TID WC   isosorbide mononitrate  30 mg Oral Daily   metoprolol succinate  50 mg Oral Daily   pantoprazole  40 mg Oral Daily   potassium chloride SA  20 mEq Oral BID   sertraline  25 mg Oral Daily   sodium bicarbonate  1,300 mg Oral TID   vancomycin  125 mg Oral BID   vancomycin variable dose per unstable renal function (pharmacist dosing)   Does not apply See admin instructions   acetaminophen **OR** acetaminophen, albuterol, HYDROmorphone (DILAUDID) injection, methocarbamol, ondansetron **OR** ondansetron (ZOFRAN) IV, oxyCODONE-acetaminophen  Assessment/ Plan:  Ms. Tracy Walsh is a 57 y.o.  female with past medical history of type 2 diabetes, hypertension, CAD, COPD, heart failure, and end-stage renal disease on peritoneal dialysis.  Patient presents to the emergency department with lower extremity redness and has been admitted under observation for Cellulitis [L03.90]  [facility]  Hypokalemia with end-stage renal disease on peritoneal dialysis.  Potassium on admission 3.3.  Receiving supplementation per home regimen.  Will continue slightly peritoneal dialysis treatments during this admission.  Will utilize dialysate 2.5% for UF needs.  2. Anemia of chronic kidney disease Lab Results   Component Value Date   HGB 7.3 (L) Apr 11, 2022   Hemoglobin below desired goal.  Will continue to monitor.  3. Secondary Hyperparathyroidism: Presumed  Lab Results  Component Value Date   CALCIUM 6.6 (L) 04-11-2022   CAION 1.10 (L) 02/01/2022   PHOS 5.5 (H) 12/22/2021    Bone minerals currently outside of desired target.  Calcium reduced, currently receives calcium carbonate outpatient.  Also prescribed sevelamer outpatient.  4.  Hypertension with chronic kidney disease.  Home regimen includes furosemide and isosorbide.  Furosemide currently held.  Currently receiving isosorbide and metoprolol.    LOS: 0   12/28/20233:06 PM

## 2022-04-15 NOTE — Treatment Plan (Signed)
Consult was received this AM and MRI was reviewed I had planned to evaluate the patient this evening, but patient underwent cardiac arrest and expired prior to my rounds.  Lanae Crumbly, DPM 04/10/22

## 2022-04-15 DEATH — deceased

## 2022-04-16 ENCOUNTER — Ambulatory Visit: Payer: 59 | Admitting: Internal Medicine

## 2022-05-09 ENCOUNTER — Ambulatory Visit (INDEPENDENT_AMBULATORY_CARE_PROVIDER_SITE_OTHER): Admitting: Gastroenterology

## 2022-08-04 IMAGING — MR MR HEEL *L* W/O CM
5 of 6 series · 36 of 40 positions shown · non-contrast
Comparison: X-ray 08/29/2021

CLINICAL DATA: Diabetic foot ulcer of left heel

EXAM:
MR OF THE LEFT HEEL WITHOUT CONTRAST
TECHNIQUE: Multiplanar, multisequence MR imaging of the left hindfoot was
performed. No intravenous contrast was administered.

[Series 3: PD fat-sat · axial · left · 3.0mm · 0.35mm/px · z∈[-27,+109]mm · 9 of 35 slices shown]
[im 1/35]
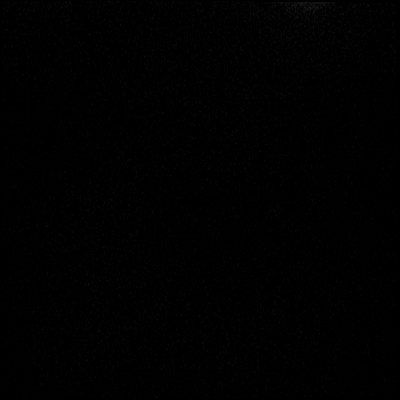
[im 5/35]
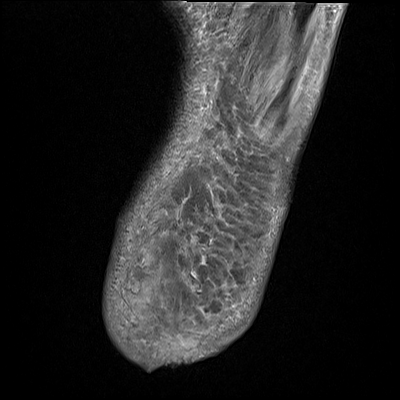
[im 9/35]
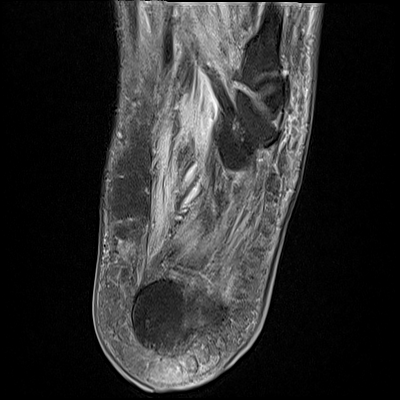
[im 13/35]
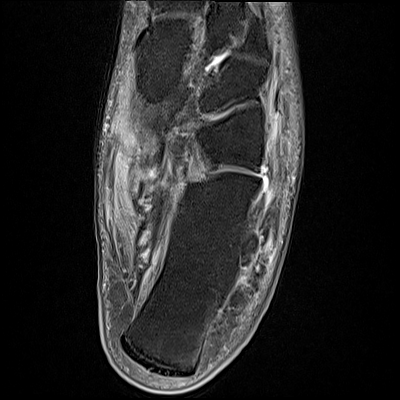
[im 18/35]
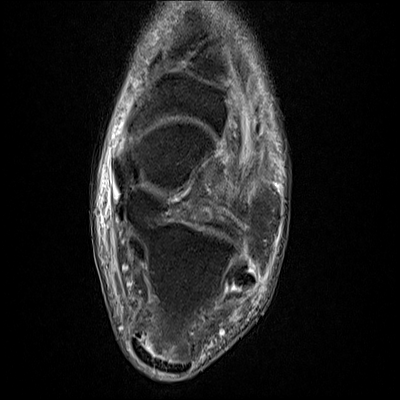
[im 22/35]
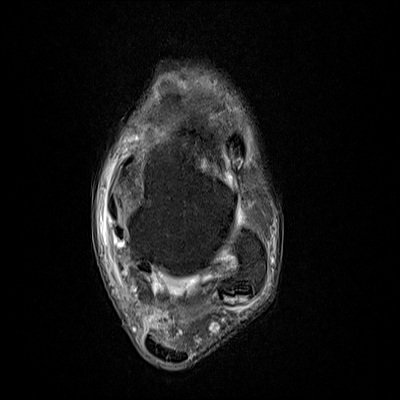
[im 26/35]
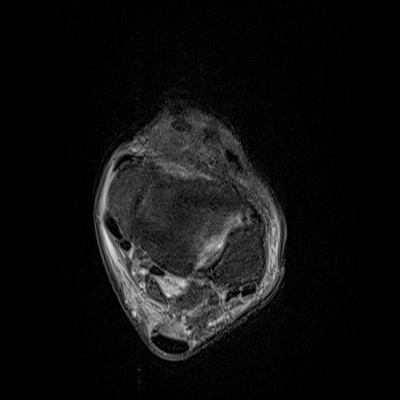
[im 30/35]
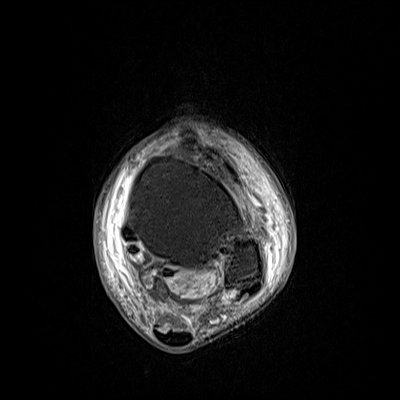
[im 35/35]
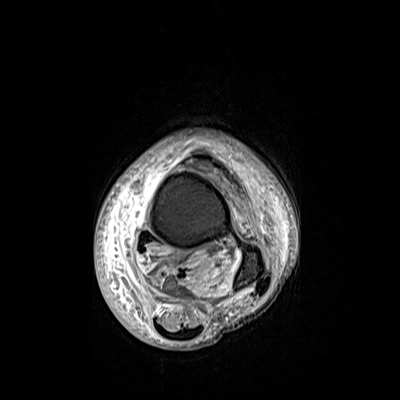

[Series 4: T2 fat-sat · axial · left · 3.0mm · 0.44mm/px · z∈[-27,+109]mm · 9 of 35 slices shown (1 of 3)]
[im 1/35]
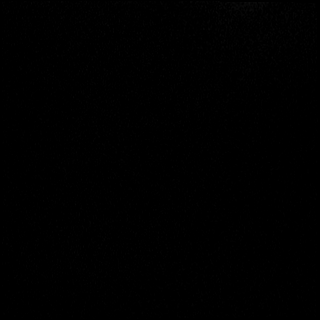
[im 5/35]
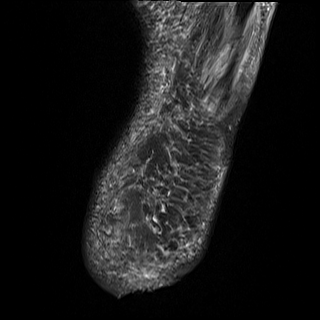
[im 9/35]
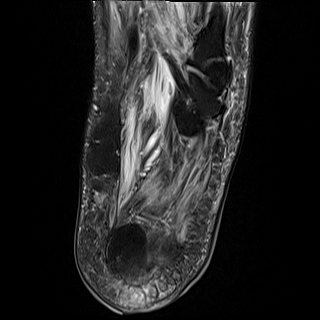
[im 13/35]
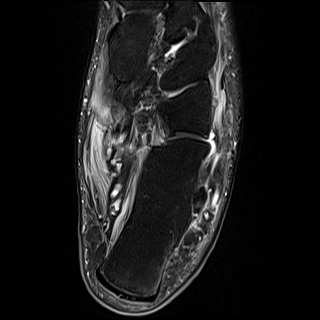
[im 18/35]
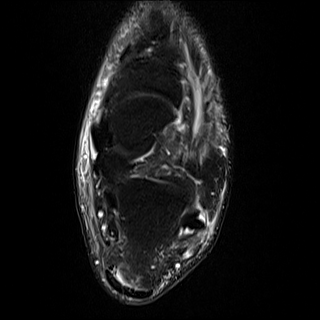
[im 22/35]
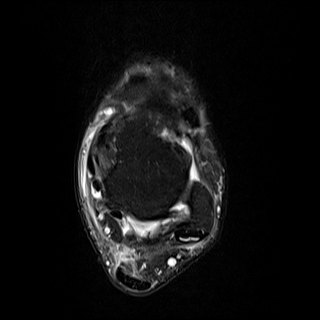
[im 26/35]
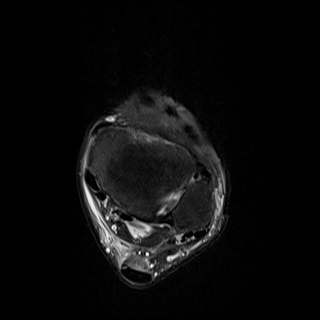
[im 30/35]
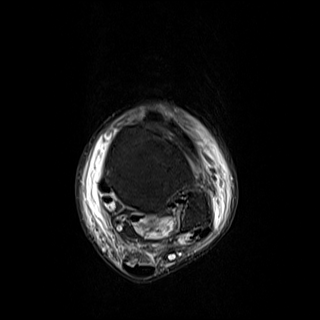
[im 35/35]
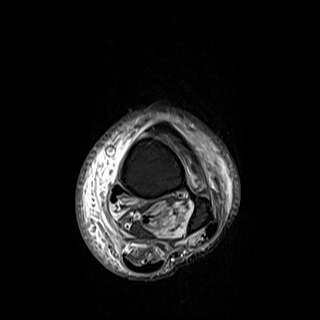

[Series 5: T2 fat-sat · coronal · left · 3.0mm · 0.44mm/px · 7 of 31 slices shown (2 of 3)]
[im 1/31]
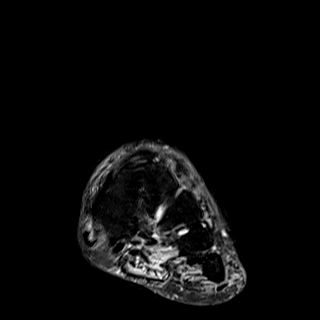
[im 6/31]
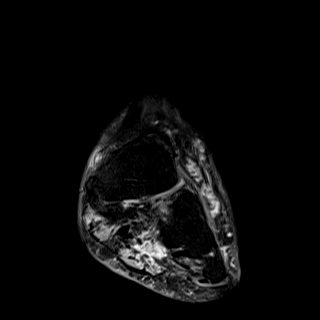
[im 11/31]
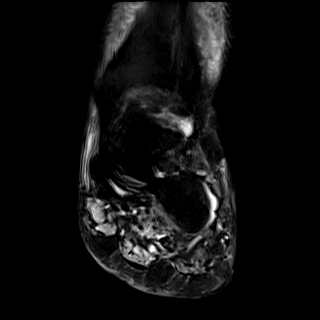
[im 16/31]
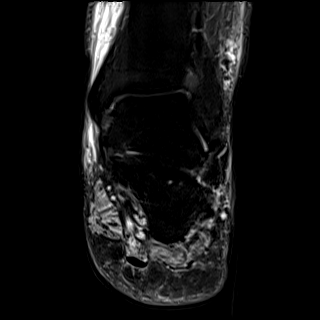
[im 21/31]
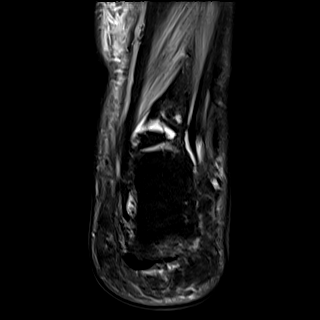
[im 26/31]
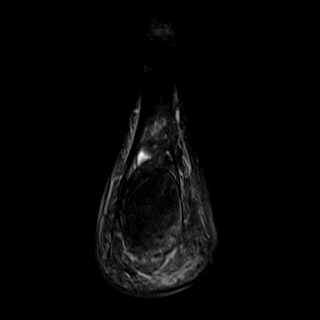
[im 31/31]
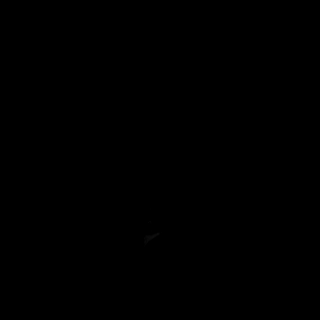

[Series 6: T1 · sagittal · left · 4.0mm · 0.35mm/px · 4 of 17 slices shown]
[im 1/17]
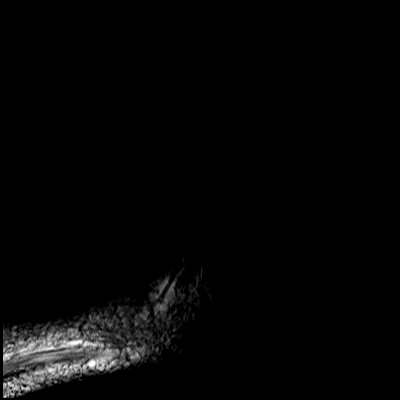
[im 6/17]
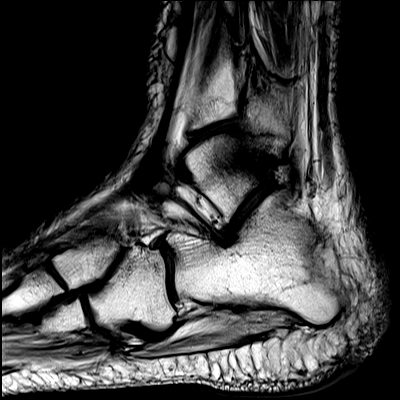
[im 11/17]
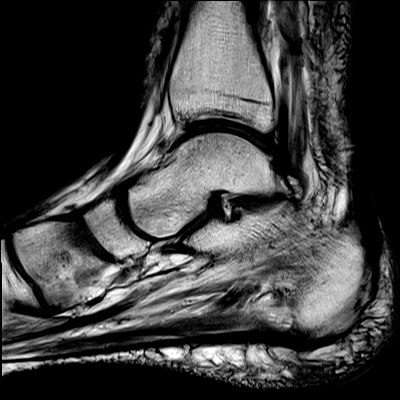
[im 17/17]
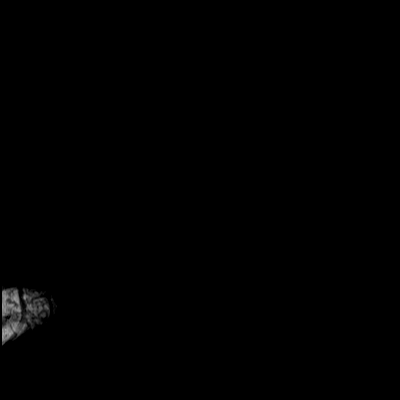

[Series 8: T2 fat-sat · coronal · left · 3.0mm · 0.44mm/px · 7 of 31 slices shown (3 of 3)]
[im 1/31]
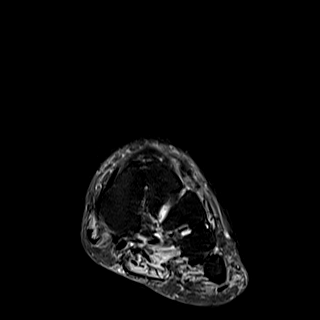
[im 6/31]
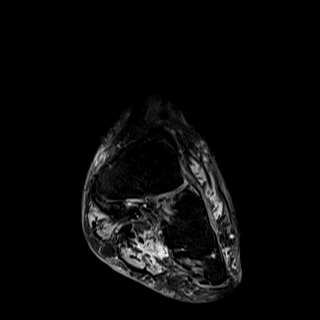
[im 11/31]
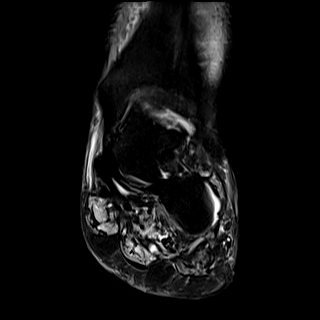
[im 16/31]
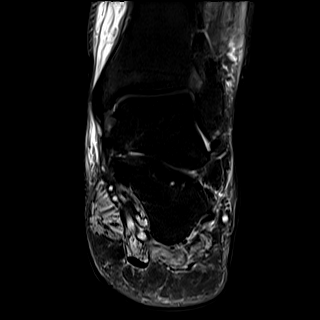
[im 21/31]
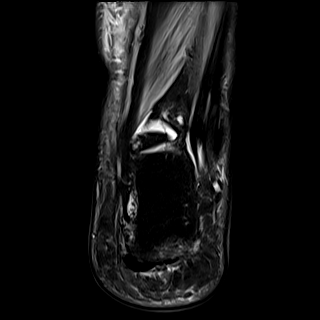
[im 26/31]
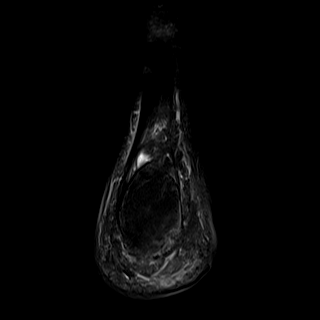
[im 31/31]
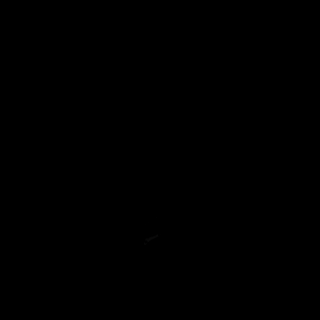

[36 of 40 positions shown; findings below may reference images not displayed]

FINDINGS: Bones/Joint/Cartilage

Mild patchy subcortical bone marrow edema within the posterior
calcaneus. No focal erosion. Preservation of the fatty T1 bone
marrow signal. Remaining osseous structures demonstrate preserved
bone marrow signal. No fracture or dislocation. Os trigonum with a
small amount of surrounding fluid. No significant joint effusion.
Joint spaces are preserved.

Ligaments

Medial and lateral ankle ligaments are intact.

Muscles and Tendons

Mild tendinosis and tenosynovitis of the peroneus longus and brevis
tendons. Trace tenosynovitis of the tibialis posterior and flexor
digitorum longus tendons. Fatty atrophy of the lower leg and foot
musculature with diffuse intramuscular edema, likely reflecting
denervation changes.

Soft tissues

Shallow soft tissue ulceration at the posterior aspect of the heel
overlying the calcaneus. Surrounding soft tissue edema. No organized
fluid collection. Generalized subcutaneous edema of the lower leg.
Trace retrocalcaneal bursal fluid.
IMPRESSION: 1. Shallow soft tissue ulceration at the posterior aspect of the
heel overlying the calcaneus. Mild patchy subcortical bone marrow
edema within the posterior calcaneus. Findings may represent
reactive osteitis or early acute osteomyelitis.
2. Mild tendinosis and tenosynovitis of the peroneus longus and
brevis tendons.
3. Trace tenosynovitis of the tibialis posterior and flexor
digitorum longus tendons.
4. Fatty atrophy of the lower leg and foot musculature with diffuse
intramuscular edema, likely reflecting denervation changes.
